# Patient Record
Sex: Female | Born: 1935 | Race: White
Health system: Southern US, Community
[De-identification: ages and names within clinical notes are randomized; demographics above are authoritative.]

## PROBLEM LIST (undated history)

## (undated) DIAGNOSIS — H9190 Unspecified hearing loss, unspecified ear: Secondary | ICD-10-CM

## (undated) DIAGNOSIS — E785 Hyperlipidemia, unspecified: Secondary | ICD-10-CM

## (undated) DIAGNOSIS — N329 Bladder disorder, unspecified: Secondary | ICD-10-CM

## (undated) DIAGNOSIS — IMO0001 Reserved for inherently not codable concepts without codable children: Secondary | ICD-10-CM

## (undated) DIAGNOSIS — E079 Disorder of thyroid, unspecified: Secondary | ICD-10-CM

## (undated) DIAGNOSIS — I499 Cardiac arrhythmia, unspecified: Secondary | ICD-10-CM

## (undated) DIAGNOSIS — R519 Headache, unspecified: Secondary | ICD-10-CM

## (undated) DIAGNOSIS — R2689 Other abnormalities of gait and mobility: Secondary | ICD-10-CM

## (undated) DIAGNOSIS — I10 Essential (primary) hypertension: Secondary | ICD-10-CM

## (undated) DIAGNOSIS — E7800 Pure hypercholesterolemia, unspecified: Secondary | ICD-10-CM

## (undated) DIAGNOSIS — R55 Syncope and collapse: Secondary | ICD-10-CM

## (undated) DIAGNOSIS — S06309A Unspecified focal traumatic brain injury with loss of consciousness of unspecified duration, initial encounter: Secondary | ICD-10-CM

## (undated) DIAGNOSIS — T8859XA Other complications of anesthesia, initial encounter: Secondary | ICD-10-CM

## (undated) DIAGNOSIS — R112 Nausea with vomiting, unspecified: Secondary | ICD-10-CM

## (undated) DIAGNOSIS — R569 Unspecified convulsions: Secondary | ICD-10-CM

## (undated) DIAGNOSIS — E039 Hypothyroidism, unspecified: Secondary | ICD-10-CM

## (undated) HISTORY — DX: Other abnormalities of gait and mobility: R26.89

## (undated) HISTORY — DX: Other complications of anesthesia, initial encounter: T88.59XA

## (undated) HISTORY — DX: Syncope and collapse: R55

## (undated) HISTORY — PX: REPLACEMENT TOTAL KNEE: SUR1224

## (undated) HISTORY — DX: Unspecified convulsions: R56.9

## (undated) HISTORY — PX: BACK SURGERY: SHX140

## (undated) HISTORY — PX: HYSTERECTOMY: SHX81

## (undated) HISTORY — DX: Unspecified hearing loss, unspecified ear: H91.90

## (undated) HISTORY — PX: CARDIAC VALVE REPLACEMENT: SHX585

## (undated) HISTORY — DX: Headache, unspecified: R51.9

## (undated) HISTORY — DX: Reserved for inherently not codable concepts without codable children: IMO0001

## (undated) HISTORY — PX: JOINT REPLACEMENT: SHX530

## (undated) HISTORY — DX: Bladder disorder, unspecified: N32.9

## (undated) HISTORY — DX: Nausea with vomiting, unspecified: R11.2

## (undated) HISTORY — DX: Pure hypercholesterolemia, unspecified: E78.00

## (undated) HISTORY — PX: AORTIC VALVE REPLACEMENT: SHX41

## (undated) HISTORY — DX: Disorder of thyroid, unspecified: E07.9

---

## 2014-07-20 HISTORY — PX: TRANSCATHETER AORTIC VALVE REPLACEMENT: SHX00005

## 2015-07-21 HISTORY — PX: WRIST SURGERY: SHX841

## 2021-03-03 ENCOUNTER — Emergency Department: Payer: Self-pay

## 2021-03-03 ENCOUNTER — Emergency Department: Payer: Medicare Other

## 2021-03-03 ENCOUNTER — Emergency Department
Admission: EM | Admit: 2021-03-03 | Discharge: 2021-03-03 | Payer: Medicare Other | Attending: Emergency Medicine | Admitting: Emergency Medicine

## 2021-03-03 ENCOUNTER — Inpatient Hospital Stay
Admission: EM | Admit: 2021-03-03 | Discharge: 2021-03-06 | DRG: 083 | Disposition: A | Discharge status: Home / Self Care | Payer: Medicare Other | Attending: Surgical Critical Care | Admitting: Surgical Critical Care

## 2021-03-03 DIAGNOSIS — W228XXA Striking against or struck by other objects, initial encounter: Secondary | ICD-10-CM | POA: Insufficient documentation

## 2021-03-03 DIAGNOSIS — I4891 Unspecified atrial fibrillation: Secondary | ICD-10-CM | POA: Diagnosis present

## 2021-03-03 DIAGNOSIS — Z79890 Hormone replacement therapy (postmenopausal): Secondary | ICD-10-CM

## 2021-03-03 DIAGNOSIS — Z79899 Other long term (current) drug therapy: Secondary | ICD-10-CM

## 2021-03-03 DIAGNOSIS — I6201 Nontraumatic acute subdural hemorrhage: Secondary | ICD-10-CM | POA: Diagnosis present

## 2021-03-03 DIAGNOSIS — Z20822 Contact with and (suspected) exposure to covid-19: Secondary | ICD-10-CM | POA: Insufficient documentation

## 2021-03-03 DIAGNOSIS — S061X9A Traumatic cerebral edema with loss of consciousness of unspecified duration, initial encounter: Secondary | ICD-10-CM | POA: Diagnosis present

## 2021-03-03 DIAGNOSIS — R54 Age-related physical debility: Secondary | ICD-10-CM | POA: Diagnosis present

## 2021-03-03 DIAGNOSIS — R1310 Dysphagia, unspecified: Secondary | ICD-10-CM | POA: Diagnosis present

## 2021-03-03 DIAGNOSIS — E039 Hypothyroidism, unspecified: Secondary | ICD-10-CM | POA: Diagnosis present

## 2021-03-03 DIAGNOSIS — I10 Essential (primary) hypertension: Secondary | ICD-10-CM | POA: Diagnosis present

## 2021-03-03 DIAGNOSIS — Z66 Do not resuscitate: Secondary | ICD-10-CM | POA: Diagnosis present

## 2021-03-03 DIAGNOSIS — E871 Hypo-osmolality and hyponatremia: Secondary | ICD-10-CM | POA: Diagnosis present

## 2021-03-03 DIAGNOSIS — W19XXXA Unspecified fall, initial encounter: Secondary | ICD-10-CM

## 2021-03-03 DIAGNOSIS — S065X9A Traumatic subdural hemorrhage with loss of consciousness of unspecified duration, initial encounter: Principal | ICD-10-CM | POA: Diagnosis present

## 2021-03-03 DIAGNOSIS — K5900 Constipation, unspecified: Secondary | ICD-10-CM | POA: Diagnosis present

## 2021-03-03 DIAGNOSIS — W1830XA Fall on same level, unspecified, initial encounter: Secondary | ICD-10-CM | POA: Insufficient documentation

## 2021-03-03 DIAGNOSIS — Z90710 Acquired absence of both cervix and uterus: Secondary | ICD-10-CM

## 2021-03-03 DIAGNOSIS — R296 Repeated falls: Secondary | ICD-10-CM | POA: Diagnosis present

## 2021-03-03 DIAGNOSIS — Y9389 Activity, other specified: Secondary | ICD-10-CM | POA: Insufficient documentation

## 2021-03-03 DIAGNOSIS — Y9301 Activity, walking, marching and hiking: Secondary | ICD-10-CM | POA: Diagnosis present

## 2021-03-03 DIAGNOSIS — S065X1A Traumatic subdural hemorrhage with loss of consciousness of 30 minutes or less, initial encounter: Secondary | ICD-10-CM | POA: Insufficient documentation

## 2021-03-03 DIAGNOSIS — R079 Chest pain, unspecified: Secondary | ICD-10-CM

## 2021-03-03 DIAGNOSIS — G4700 Insomnia, unspecified: Secondary | ICD-10-CM | POA: Diagnosis present

## 2021-03-03 DIAGNOSIS — Y92002 Bathroom of unspecified non-institutional (private) residence single-family (private) house as the place of occurrence of the external cause: Secondary | ICD-10-CM | POA: Insufficient documentation

## 2021-03-03 DIAGNOSIS — E785 Hyperlipidemia, unspecified: Secondary | ICD-10-CM | POA: Diagnosis present

## 2021-03-03 DIAGNOSIS — Y92009 Unspecified place in unspecified non-institutional (private) residence as the place of occurrence of the external cause: Secondary | ICD-10-CM

## 2021-03-03 HISTORY — DX: Essential (primary) hypertension: I10

## 2021-03-03 HISTORY — DX: Hyperlipidemia, unspecified: E78.5

## 2021-03-03 HISTORY — DX: Unspecified focal traumatic brain injury with loss of consciousness of unspecified duration, initial encounter: S06.309A

## 2021-03-03 HISTORY — DX: Cardiac arrhythmia, unspecified: I49.9

## 2021-03-03 LAB — CBC AND DIFFERENTIAL
Absolute NRBC: 0 10*3/uL (ref 0.00–0.00)
Basophils Absolute Automated: 0.03 10*3/uL (ref 0.00–0.08)
Basophils Automated: 0.3 %
Eosinophils Absolute Automated: 0.1 10*3/uL (ref 0.00–0.44)
Eosinophils Automated: 1.1 %
Hematocrit: 37.9 % (ref 34.7–43.7)
Hgb: 12.9 g/dL (ref 11.4–14.8)
Immature Granulocytes Absolute: 0.03 10*3/uL (ref 0.00–0.07)
Immature Granulocytes: 0.3 %
Lymphocytes Absolute Automated: 1.78 10*3/uL (ref 0.42–3.22)
Lymphocytes Automated: 19.9 %
MCH: 31.8 pg (ref 25.1–33.5)
MCHC: 34 g/dL (ref 31.5–35.8)
MCV: 93.3 fL (ref 78.0–96.0)
MPV: 9.2 fL (ref 8.9–12.5)
Monocytes Absolute Automated: 0.57 10*3/uL (ref 0.21–0.85)
Monocytes: 6.4 %
Neutrophils Absolute: 6.42 10*3/uL — ABNORMAL HIGH (ref 1.10–6.33)
Neutrophils: 72 %
Nucleated RBC: 0 /100 WBC (ref 0.0–0.0)
Platelets: 172 10*3/uL (ref 142–346)
RBC: 4.06 10*6/uL (ref 3.90–5.10)
RDW: 13 % (ref 11–15)
WBC: 8.93 10*3/uL (ref 3.10–9.50)

## 2021-03-03 LAB — COMPREHENSIVE METABOLIC PANEL
ALT: 20 U/L (ref 0–55)
AST (SGOT): 31 U/L (ref 5–34)
Albumin/Globulin Ratio: 1.5 (ref 0.9–2.2)
Albumin: 4.3 g/dL (ref 3.5–5.0)
Alkaline Phosphatase: 40 U/L (ref 37–117)
Anion Gap: 12 (ref 5.0–15.0)
BUN: 16 mg/dL (ref 7.0–19.0)
Bilirubin, Total: 0.5 mg/dL (ref 0.2–1.2)
CO2: 21 mEq/L — ABNORMAL LOW (ref 22–29)
Calcium: 9.2 mg/dL (ref 7.9–10.2)
Chloride: 97 mEq/L — ABNORMAL LOW (ref 100–111)
Creatinine: 0.7 mg/dL (ref 0.6–1.0)
Globulin: 2.9 g/dL (ref 2.0–3.6)
Glucose: 126 mg/dL — ABNORMAL HIGH (ref 70–100)
Potassium: 3.9 mEq/L (ref 3.5–5.1)
Protein, Total: 7.2 g/dL (ref 6.0–8.3)
Sodium: 130 mEq/L — ABNORMAL LOW (ref 136–145)

## 2021-03-03 LAB — TROPONIN I: Troponin I: <0.01 ng/mL (ref 0.00–0.05)

## 2021-03-03 LAB — PT AND APTT
PT INR: 1 (ref 0.9–1.1)
PT: 12 s (ref 10.1–12.9)
PTT: 23 s — ABNORMAL LOW (ref 27–39)

## 2021-03-03 LAB — COVID-19 (SARS-COV-2): SARS CoV-2: NEGATIVE

## 2021-03-03 LAB — GFR: EGFR: >60

## 2021-03-03 MED ORDER — ACETAMINOPHEN 10 MG/ML IV SOLN
1000.0000 mg | Freq: Three times a day (TID) | INTRAVENOUS | Status: DC
Start: 2021-03-03 — End: 2021-03-04
  Administered 2021-03-04 (×2): 1000 mg via INTRAVENOUS
  Filled 2021-03-03 (×3): qty 100

## 2021-03-03 MED ORDER — LABETALOL HCL 5 MG/ML IV SOLN (WRAP)
20.0000 mg | Freq: Once | INTRAVENOUS | Status: AC
Start: 2021-03-03 — End: 2021-03-03
  Administered 2021-03-03: 22:00:00 10 mg via INTRAVENOUS
  Filled 2021-03-03: qty 4

## 2021-03-03 NOTE — ED Provider Notes (Cosign Needed)
EMERGENCY DEPARTMENT NOTE     [name] initially seen and examined at   ED PHYSICIAN ASSIGNED       None           ED MIDLEVEL (APP) ASSIGNED       Date/Time Event User Comments    03/03/21 1837 PA/NP Provider Assigned Murrell Converse, Mckinnley Cottier Sheldon Silvan, DNP FNP assigned as Nurse Practitioner            HISTORY OF PRESENT ILLNESS   Historian: [name], EMS  Translator Used: no    Chief Complaint: Syncope and Head Injury       Mechanism of Injury:       87 y.o. female with no known medical history was brought in by EMS for a right-sided head injury s/p possible syncopal episode today.  2 episodes of emesis in route and [name] given Zofran.  [name] has no recollection of incident.  Denies headache, lightheadedness, dizziness, numbness tingling, blurry or double vision, neck pain, back pain, chest pain, shortness of breath, unilateral weakness, abdominal pain, urinary retention, bla no anticoagulant therapy.  Dder or bowel incontinence, saddle anesthesia.    Location of symptoms: Head  Onset of symptoms: Today  What was [name] doing when symptoms started (Context): see above  Severity: moderate  Timing: Acute  Activities that worsen symptoms: None  Activities that improve symptoms: None  Quality: No complaints at this time  Radiation of symptoms: no  Associated signs and Symptoms: see above  Are symptoms worsening? yes  MEDICAL HISTORY     Past Medical History:  History reviewed. No pertinent past medical history.    Past Surgical History:  History reviewed. No pertinent surgical history.    Social History:  Social History     Socioeconomic History    Marital status: Unknown       Family History:  History reviewed. No pertinent family history.    Outpatient Medication:  Previous Medications    AMLODIPINE (NORVASC) 5 MG TABLET    TAKE 1 TABLET BY MOUTH IN THE MORNING HALF TABLET THE EVENING    DICLOFENAC SODIUM (VOLTAREN) 1 % GEL TOPICAL GEL    APPLY 1 GRAM EVERY 8 HOURS    DIGOXIN (LANOXIN) 0.125 MG TABLET    TAKE 1 TABLET BY  MOUTH ONCE A DAY ON TUESDAY, THURSDAY, SATURDAY, AND SUNDAY.    DIGOXIN (LANOXIN) 0.25 MG TABLET    TAKE 1 TABLET BY MOUTH ON MONDAY/WEDNESDAY AND FRIDAY 90 DAY(S)    FUNGOID TINCTURE 2 % SOLUTION    APPLY 1ML TWICE A DAY TO TOE NAIL    LEVOTHYROXINE (SYNTHROID) 88 MCG TABLET    Take 88 mcg by mouth every morning    LISINOPRIL (ZESTRIL) 10 MG TABLET    TAKE 1 TABLET ORALLY ONCE A DAY EVERY MORNING FOR 30 DAY(S)    MELOXICAM (MOBIC) 7.5 MG TABLET    Take 7.5 mg by mouth daily    RALOXIFENE (EVISTA) 60 MG TABLET    Take 60 mg by mouth daily    ROSUVASTATIN (CRESTOR) 20 MG TABLET    Take 20 mg by mouth nightly    UREA (CARMOL) 40 % CREAM    APPLY 1 GRAM ONCE A DAY TO NAILS AND CALLUSES         REVIEW OF SYSTEMS   Review of Systems     10 /14 Review of Systems completed and negative except as stated above in the HPI   Reviewed: Constitutional, HENT, Eyes, Resp, CV, GI, GU,  MSK, Skin, Neuro, Psych    PHYSICAL EXAM     ED Triage Vitals [03/03/21 1836]   Enc Vitals Group      BP 188/86      Heart Rate 92      Resp Rate 16      Temp 98.2 F (36.8 C)      Temp Source Oral      SpO2 99 %      Weight 59 kg      Height 1.6 m      Head Circumference       Peak Flow       Pain Score 0      Pain Loc       Pain Edu?       Excl. in GC?      Vitals:    03/03/21 1836 03/03/21 1847 03/03/21 1953   BP: 188/86 180/81 172/79   Pulse: 92 89 90   Resp: 16 16 16    Temp: 98.2 F (36.8 C)     TempSrc: Oral     SpO2: 99% 98% 97%   Weight: 59 kg     Height: 5\' 3"  (1.6 m)       Nursing note and vitals reviewed.  Constitutional:  Well developed, well nourished. Awake & Oriented x3.  Head:  Atraumatic. Normocephalic.    Eyes:  PERRL. EOMI. Conjunctivae are not pale.  ENT:  Mucous membranes are moist and intact. Oropharynx is clear and symmetric.  Patent airway.  Neck:  Supple. Full ROM.    Cardiovascular:  Regular rate. Regular rhythm. No murmurs, rubs, or gallops.  Pulmonary/Chest:  No evidence of respiratory distress. Clear to auscultation  bilaterally.  No wheezing, rales or rhonchi.   Abdominal:  Soft and non-distended. There is no tenderness. No rebound, guarding, or rigidity.  Back:  Full ROM. Nontender.  Extremities:  No edema. No cyanosis. No clubbing. Full range of motion in all extremities.  Skin:  Skin is warm and dry.  No diaphoresis. No rash.   Neurological:  Alert, awake, and appropriate. Normal speech. Motor normal.  Psychiatric:  Good eye contact. Normal interaction, affect, and behavior.      MEDICAL DECISION MAKING     DISCUSSION      87 year old female with a history of hypertension, hyperlipidemia, hypothyroidism was evaluated for a head injury with LOC s/p fall.    [name] awake and alert and oriented x4 answering questions appropriately., + Large hematoma of right side scalp.  No midline tenderness of cervical spine.  Normal neurological exam without focal deficits.  Elevated BP of 188/86, otherwise stable vitals.  Low suspicion for CVA at this time. Differential diagnosis includes but not limited to ICH, orthostatic hypotension, vasovagal syncope, hypoglycemia, seizure. Plan for STAT CT head.     Spoke with daughter who stated [name] currently visiting from [place] and staying with her during the summer.  She has been her normal self and running errands with her all day.  While at her daughter's house, while [name] was walking to the bathroom, the daughter heard a thud on the ground and found the [name] lying on her back making gurgling sounds and noticed fullness of her mouth. + LOC approximately 6 minutes until EMS arrival.  [name] was assisted on the toilet to urinate and had multiple episodes of emesis prior to arrival.    1942: Spoke with Radiologist, Dr. Quentin Mulling regarding CT head findings of a small acute left subdural hematoma overlying left frontoparietal  lobe.  Hyperdense masslike nodule left parietotemporal lobe.  1948: Transfer line. [facility] bed capacity.  Recommended ED to ED transfer.  1955: [facility] ED, Dr. Willeen Niece  accepting.   2018: MMT arrival for transfer. [name] clinically stable to transfer at this time.     Results discussed with [name] and family members and all questions answered.           I do not suspect severe sepsis or septic shock    Vital Signs: Reviewed the [name]'s vital signs.   Nursing Notes: Reviewed and utilized available nursing notes.  Medical Records Reviewed: Reviewed available past medical records.  Counseling: The emergency provider has spoken with the [name] and discussed today's findings, in addition to providing specific details for the plan of care.  Questions are answered and there is agreement with the plan.      MIPS DOCUMENTATION      HEAD TRAUMA  INDICATIONS FOR CT HEAD DUE TO TRAUMA    Head CT/trauma indications - Age 36 or older      CARDIAC STUDIES    The following cardiac studies were independently interpreted by the Emergency Medicine Physician.  For full cardiac study results please see chart.    EKG Interpretation:  Signed and interpreted by me Murrell Converse)  Time Interpreted: 1901  Comparison: none  Rate: 87  Rhythm: NSR  Axis: normal  Intervals: normal  Blocks: IRBBB  ST segments: no STEMI  Interpretation: Nonspecific  EKG    EMERGENCY IMAGING STUDIES    The following imagine studies were independently interpreted by me (emergency physician):    Radiology:  Interpreted by me Murrell Converse)  Study: Chest Xray   Results: No infiltrate. No pneumothorax. No hemothorax. No cardiomegaly. No CHF.  Impression: No acute intrathoracic abnormality.    RADIOLOGY IMAGING STUDIES      CT Cervical Spine without Contrast   Final Result      1. No acute bony abnormality of the cervical spine.   2. Straightening cervical spine may relate to position or spasm.   3. Degenerative changes of the lower cervical spine.      Wyatt Portela, MD    03/03/2021 7:39 PM      CT Head without Contrast   Final Result      1. Small acute left subdural hematoma overlying the left frontal   parietal lobe.   2. Ovoid hyperdense  masslike nodule in the left parietotemporal lobe.   Differential considerations include mass versus focal evolving   intrapedicle hemorrhage. Recommend further evaluation with   contrast-enhanced MRI.      Findings were discussed with Delorise Shiner unit on 03/03/2021 at 7:40 PM      Wyatt Portela, MD    03/03/2021 7:59 PM      Chest AP Portable    (Results Pending)         PULSE OXIMETRY    Oxygen Saturation by Pulse Oximetry: 97%  Interventions: none  Interpretation:  stable    EMERGENCY DEPT. MEDICATIONS      ED Medication Orders (From admission, onward)      None            LABORATORY RESULTS    Ordered and independently interpreted AVAILABLE laboratory tests. Please see results section in chart for full details.  Results for orders placed or performed during the hospital encounter of 03/03/21   COVID-19 (SARS-COV-2) ( Rapid)    Specimen: Nasopharynx; Nasopharyngeal Swab   Result Value Ref Range    Purpose of  COVID testing Screening     SARS-CoV-2 Specimen Source Nasopharyngeal    CBC and differential   Result Value Ref Range    WBC 8.93 3.10 - 9.50 x10 3/uL    Hgb 12.9 11.4 - 14.8 g/dL    Hematocrit 54.0 98.1 - 43.7 %    Platelets 172 142 - 346 x10 3/uL    RBC 4.06 3.90 - 5.10 x10 6/uL    MCV 93.3 78.0 - 96.0 fL    MCH 31.8 25.1 - 33.5 pg    MCHC 34.0 31.5 - 35.8 g/dL    RDW 13 11 - 15 %    MPV 9.2 8.9 - 12.5 fL    Neutrophils 72.0 None %    Lymphocytes Automated 19.9 None %    Monocytes 6.4 None %    Eosinophils Automated 1.1 None %    Basophils Automated 0.3 None %    Immature Granulocytes 0.3 None %    Nucleated RBC 0.0 0.0 - 0.0 /100 WBC    Neutrophils Absolute 6.42 (H) 1.10 - 6.33 x10 3/uL    Lymphocytes Absolute Automated 1.78 0.42 - 3.22 x10 3/uL    Monocytes Absolute Automated 0.57 0.21 - 0.85 x10 3/uL    Eosinophils Absolute Automated 0.10 0.00 - 0.44 x10 3/uL    Basophils Absolute Automated 0.03 0.00 - 0.08 x10 3/uL    Immature Granulocytes Absolute 0.03 0.00 - 0.07 x10 3/uL    Absolute NRBC 0.00 0.00 - 0.00  x10 3/uL   Comprehensive metabolic panel   Result Value Ref Range    Glucose 126 (H) 70 - 100 mg/dL    BUN 19.1 7.0 - 47.8 mg/dL    Creatinine 0.7 0.6 - 1.0 mg/dL    Sodium 295 (L) 621 - 145 mEq/L    Potassium 3.9 3.5 - 5.1 mEq/L    Chloride 97 (L) 100 - 111 mEq/L    CO2 21 (L) 22 - 29 mEq/L    Calcium 9.2 7.9 - 10.2 mg/dL    Protein, Total 7.2 6.0 - 8.3 g/dL    Albumin 4.3 3.5 - 5.0 g/dL    AST (SGOT) 31 5 - 34 U/L    ALT 20 0 - 55 U/L    Alkaline Phosphatase 40 37 - 117 U/L    Bilirubin, Total 0.5 0.2 - 1.2 mg/dL    Globulin 2.9 2.0 - 3.6 g/dL    Albumin/Globulin Ratio 1.5 0.9 - 2.2    Anion Gap 12.0 5.0 - 15.0   Troponin I   Result Value Ref Range    Troponin I <0.01 0.00 - 0.05 ng/mL   GFR   Result Value Ref Range    EGFR >60.0     PT/APTT   Result Value Ref Range    PT 12.0 10.1 - 12.9 sec    PT INR 1.0 0.9 - 1.1    PTT 23 (L) 27 - 39 sec   ECG 12 lead   Result Value Ref Range    Ventricular Rate 87 BPM    Atrial Rate 87 BPM    P-R Interval 152 ms    QRS Duration 108 ms    Q-T Interval 408 ms    QTC Calculation (Bezet) 490 ms    P Axis 28 degrees    R Axis 4 degrees    T Axis 24 degrees    IHS MUSE NARRATIVE AND IMPRESSION       NORMAL SINUS RHYTHM  INCOMPLETE RIGHT BUNDLE BRANCH BLOCK  INFERIOR MYOCARDIAL INFARCTION , AGE UNDETERMINED  ABNORMAL ECG  NO PREVIOUS ECGS AVAILABLE         CRITICAL CARE/PROCEDURES    Critical Care  Performed by: Sheldon Silvan, DNP FNP  Authorized by: Sheldon Silvan, DNP FNP     Critical care provider statement:     Critical care time (minutes):  47    Critical care time was exclusive of:  Separately billable procedures and treating other patients and teaching time    Critical care was necessary to treat or prevent imminent or life-threatening deterioration of the following conditions:  Trauma    Critical care was time spent personally by me on the following activities:  Blood draw for specimens, development of treatment plan with [name] or surrogate, examination of [name], ordering  and performing treatments and interventions, ordering and review of laboratory studies, ordering and review of radiographic studies, pulse oximetry, re-evaluation of [name]'s condition, review of old charts and evaluation of [name]'s response to treatment    I assumed direction of critical care for this [name] from another provider in my specialty: no      Care discussed with: accepting provider at another facility      DIAGNOSIS      Diagnosis:  Final diagnoses:   Post-traumatic subdural hematoma, with loss of consciousness of 30 minutes or less, initial encounter   Fall, initial encounter       Disposition:  ED Disposition       ED Disposition   Transfer to [facility] ED    Condition   --    Date/Time   Mon Mar 03, 2021  8:02 PM    Comment   Dr. Eloise Harman accepting                 Prescriptions:  [name]'s Medications   New Prescriptions    No medications on file   Previous Medications    AMLODIPINE (NORVASC) 5 MG TABLET    TAKE 1 TABLET BY MOUTH IN THE MORNING HALF TABLET THE EVENING    DICLOFENAC SODIUM (VOLTAREN) 1 % GEL TOPICAL GEL    APPLY 1 GRAM EVERY 8 HOURS    DIGOXIN (LANOXIN) 0.125 MG TABLET    TAKE 1 TABLET BY MOUTH ONCE A DAY ON TUESDAY, THURSDAY, SATURDAY, AND SUNDAY.    DIGOXIN (LANOXIN) 0.25 MG TABLET    TAKE 1 TABLET BY MOUTH ON MONDAY/WEDNESDAY AND FRIDAY 90 DAY(S)    FUNGOID TINCTURE 2 % SOLUTION    APPLY TWICE A DAY TO TOE NAIL    LEVOTHYROXINE (SYNTHROID) 88 MCG TABLET    Take 88 mcg by mouth every morning    LISINOPRIL (ZESTRIL) 10 MG TABLET    TAKE 1 TABLET ORALLY ONCE A DAY EVERY MORNING FOR 30 DAY(S)    MELOXICAM (MOBIC) 7.5 MG TABLET    Take 7.5 mg by mouth daily    RALOXIFENE (EVISTA) 60 MG TABLET    Take 60 mg by mouth daily    ROSUVASTATIN (CRESTOR) 20 MG TABLET    Take 20 mg by mouth nightly    UREA (CARMOL) 40 % CREAM    APPLY 1 GRAM ONCE A DAY TO NAILS AND CALLUSES   Modified Medications    No medications on file   Discontinued Medications    No medications on file       This note was  generated by the Epic EMR system/ Dragon speech recognition and may contain inherent errors or omissions not intended by  the user. Grammatical errors, random word insertions, deletions and pronoun errors  are occasional consequences of this technology due to software limitations. Not all errors are caught or corrected. If there are questions or concerns about the content of this note or information contained within the body of this dictation they should be addressed directly with the author for clarification.     Sheldon Silvan, DNP FNP  03/03/21 2026

## 2021-03-03 NOTE — Consults (Signed)
NEUROSURGERY CONSULTATION    Date Time: 03/03/21 9:40 PM  Patient Name: [name]  Requesting Physician: Aggie Moats, MD  Consulting Physician: {NS Attendings:[id number]}   Covered By: {NS Resident/PA:[id number]}    Time Consult Called:  ***     Time of Exam:  ***    Assessment:   87 y.o. female with ***    Plan:   ***  ICP monitor: {ICP monitor:[id number]}     Reason for Consultation:   ***    History:   [name] is a 87 y.o. *** handed female who presents to the hospital on 03/03/2021 with ***     Chief Complaint   Patient presents with    Fall    Headache     Location: ***  Quality: ***  Severity: ***  Duration: ***  Timing: ***  Context: ***  Modifying factors:  ***  Associated signs/symptoms: ***    Past Medical History:   No past medical history on file.  ***I reviewed the past medical history myself. The patient has no medical problems.  ***Unavailable due to patient's condition.     Past Surgical History:   No past surgical history on file.  ***I reviewed the past surgical history myself. The patient has no prior surgical history.  ***Unavailable due to patient's condition.     Family History:   No family history on file.  ***I reviewed the past family history myself. Family history is non-contributory and there is no family history pertinent to this encounter.  ***Unavailable due to patient's condition.     Social History:     Social History     Socioeconomic History    Marital status: Unknown     Spouse name: Not on file    Number of children: Not on file    Years of education: Not on file    Highest education level: Not on file   Occupational History    Not on file   Tobacco Use    Smoking status: Not on file    Smokeless tobacco: Not on file   Substance and Sexual Activity    Alcohol use: Not on file    Drug use: Not on file    Sexual activity: Not on file   Other Topics Concern    Not on file   Social History Narrative    Not on file     Social Determinants of Health     Financial Resource  Strain: Not on file   Food Insecurity: Not on file   Transportation Needs: Not on file   Physical Activity: Not on file   Stress: Not on file   Social Connections: Not on file   Intimate Partner Violence: Not on file   Housing Stability: Not on file     ***Reviewed and non-contributory to patient's current illness.   ***Unavailable due to patient's condition.     Allergies:   No Known Allergies  ***Unavailable due to patient's condition    Medications:     No current facility-administered medications on file prior to encounter.     Current Outpatient Medications on File Prior to Encounter   Medication Sig Dispense Refill    amLODIPine (NORVASC) 5 MG tablet TAKE 1 TABLET BY MOUTH IN THE MORNING HALF TABLET THE EVENING      diclofenac Sodium (VOLTAREN) 1 % Gel topical gel APPLY 1 GRAM EVERY 8 HOURS      digoxin (LANOXIN) 0.125 MG tablet TAKE 1 TABLET BY MOUTH  ONCE A DAY ON TUESDAY, THURSDAY, SATURDAY, AND SUNDAY.      digoxin (LANOXIN) 0.25 MG tablet TAKE 1 TABLET BY MOUTH ON MONDAY/WEDNESDAY AND FRIDAY 90 DAY(S)      Fungoid Tincture 2 % Solution APPLY TWICE A DAY TO TOE NAIL      levothyroxine (SYNTHROID) 88 MCG tablet Take 88 mcg by mouth every morning      lisinopril (ZESTRIL) 10 MG tablet TAKE 1 TABLET ORALLY ONCE A DAY EVERY MORNING FOR 30 DAY(S)      meloxicam (MOBIC) 7.5 MG tablet Take 7.5 mg by mouth daily      raloxifene (EVISTA) 60 MG tablet Take 60 mg by mouth daily      rosuvastatin (CRESTOR) 20 MG tablet Take 20 mg by mouth nightly      urea (CARMOL) 40 % cream APPLY 1 GRAM ONCE A DAY TO NAILS AND CALLUSES         Review of Systems:   A comprehensive review of systems was: completed and is negative except as noted in HPI ***    Constitutional: negative for - fever, chills, sweating, fatigue, change in activity  General: negative for - recent weight gain, recent weight loss, appetite change, headaches  Eyes: negative for - blurry vision, double vision, eye pain, light sensitivity, eye discharge  ENT:  negative for - hearing loss, nasal discharge, hoarseness, sore throat, swallowing difficulty  Heart: negative for - chest pain or tightness, palpitations, fainting, other heart trouble  Lungs: negative for - chronic cough, shortness of breath, wheezing, sleep apnea, hemoptysis  Gastrointestinal: negative for - abdominal pain, nausea, vomiting, diarrhea, constipation, bloody stool  Genito-Urinary: negative for - urinary retention, urinary incontinence, urinary urgency, urinary discharge  Musculoskeletal: negative for - joint swelling, joint pain, leg swelling, leg cramping, fractures  Neurological: negative for - dizziness, tremors, memory loss, speech difficulty, seizures  Hematological and Lymphatic: negative for - easy bleeding, easy bruising, swollen nodes  Skin: negative for - rash, enlarged glands, moles or skin lesions, color change, infection  Psychiatric: negative for - anxiety, depression, confusion, excessive stress, suicidal thoughts    All other systems were reviewed and are negative.    Physical Exam:     Vitals:    03/03/21 2110   BP: 175/74   Pulse: 94   Resp: 18   Temp: 97.8 F (36.6 C)   SpO2: 97%       Physical Exam:  General: well appearing adult ***, no obvious acute distress, cooperative with examination  Psychologic: Affect appropriate, judgment and insight consistent with situation, no delusions or hallucinations  Skin: Warm, dry, no obvious lesions or scars  Eyes: Sclerae anicteric, no conjunctival injection  ENT: No otorrhea, no rhinorrhea, trachea midline  Head: Normocephalic, atraumatic  Neck: No palpable masses  Musculoskeletal: Full ROM, normal muscle tone, no atrophy  Pulmonary: Normal respiratory effort, no audible wheezing  Cardiovascular: No pedal edema, pulses 2+ in bilat lower extremities  Abdominal: Non-tender to palpation, non-distended, no organomegaly, no palpable masses    Neuro Exam:   Awake alert oriented x ***  Fund of knowledge appropriate.  Recent and remote memory are  intact.  Attention span and concentration appear normal.  No delusions or hallucinations.  GCS: *** E: *** V: *** M: ***  Speech is *** Language function is normal. There is no evidence of aphasia in conversational speech.   PERRL *** EOM: ***, face: ***, Tongue: ***   Cranial nerves:   -CN II: Visual  fields full to bedside confrontation   -CN III, IV, VI: Pupils equal, round, and reactive to light; extraocular movements intact; no ptosis                                 -CN V: Facial sensation intact in V1 through V3 distributions   -CN VII: Face symmetric   -CN VIII: Hearing intact to conversational speech   -CN IX, X: Palate elevates symmetrically; normal phonation   -CN XI: Symmetric full strength of sternocleidomastoid and trapezius muscles   -CN XII: Tongue protrudes midline  Strength/Motor function:   LUE  Delt: ***/5 Bi: ***/5 Tri: ***/5 Wfl: ***/5 Wex: ***/5 Grip: ***/5  RUE  Delt: ***/5 Bi: ***/5 Tri: ***/5 Wfl: ***/5 Wex: ***/5 Grip: ***/5  LLE  Psoas: ***/5 Quad: ***/5 Lfl: ***/5 Lex: ***/5 ADF: ***/5 APF: ***/5 EHL: ***/5  RLE  Psoas: ***/5 Quad: ***/5 Lfl: ***/5 Lex: ***/5 ADF: ***/5 APF: ***/5 EHL: ***/5  Sensation to light touch: ***  Sensation to sharp touch: ***  Pronator drift: ***  DTRs:   Left: Bic: *** Tric: *** Pat: *** Ankle: ***  Right: Bic: *** Tric: *** Pat: *** Ankle: ***  Hoffmann's: ***  Clonus: ***  Babinski: ***  Coordination: Finger to Nose *** No tremors.  Gait: Station normal, gait stable; tandem walk intact; able to toe and heel walk.  Romberg negative.    Labs Reviewed:     Lab Results   Component Value Date    WBC 8.93 03/03/2021    HGB 12.9 03/03/2021    HCT 37.9 03/03/2021    MCV 93.3 03/03/2021    PLT 172 03/03/2021     Lab Results   Component Value Date    NA 130 (L) 03/03/2021    K 3.9 03/03/2021    CL 97 (L) 03/03/2021    CO2 21 (L) 03/03/2021     Lab Results   Component Value Date    INR 1.0 03/03/2021    PT 12.0 03/03/2021       Rads:     Radiology Results (24 Hour)        ** No results found for the last 24 hours. **              Signed by: Cecilio Asper, [facility]  Date/Time: 03/03/21 9:40 PM

## 2021-03-03 NOTE — ED Notes (Signed)
Bed: T 2A  Expected date:   Expected time:   Means of arrival:   Comments:  Trauma transfer -> 22 when settled

## 2021-03-03 NOTE — ED Notes (Signed)
MMT here to tx pt to [facility]; VSS and pt remains AxOx4 on d/c

## 2021-03-03 NOTE — ED Notes (Signed)
Pt AxOx4 at this time; in NAD and VSS; assisted with using restroom via [place]; family remains at bedside

## 2021-03-03 NOTE — ED Notes (Signed)
Tx from [facility] for SDH s/p fall. CT shows, small left SDH  -> Left parietal.  measures 1.9 x 1.2 cm. ? intraparenchymal hemorrhage measuring 1.9 x 1.2 cm in the L parietotemporal lobe    Allegedly per family. Had a another traumatic headbleed from a fall (in [place]) last year. Hx of Irregular heartbeat but no thinners, HTN, HLD and Hypothyroid.    A&O x4, pupils 2-3 brisk bilat. MAEW, 5/5 strength in all extremities

## 2021-03-03 NOTE — Discharge Instructions (Addendum)
You were seen in the emergency department for head injury with loss of consciousness after a fall today.    There is a subdural brain bleed seen on your CAT scan.    We are transferring you to [facility] emergency department for further evaluation.

## 2021-03-03 NOTE — H&P (Signed)
TRAUMA HISTORY AND PHYSICAL     Date Time: 03/03/21 9:54 PM  Patient Name: [name]  Attending Physician: Era Skeen, MD  Primary Care Physician: Patsy Lager, MD    Date of Admission:   03/03/2021  9:04 PM    Trauma Level:   Consult    Assessment/Plan:   The patient has the following active problems:  There are no active hospital problems to display for this patient.       Plan by systems:  Neuro: L SDH with likely IPH, acute post traumatic pain  - NSGY: Q1 hr NC, HOB >30  - multimodal pain control regimen - try to refrain from narcotic use as patient has severe GI SE  Pulm: SORA   - pulm toilet/IS/mobilization   CV: HDS, hx HTN, afib on digoxin currently in sinus rhythm   - monitor VS   - SBP goal <160   - cont home meds   Endo: hypothyroidism  - cont home synthroid   GI: NPO, mIVF  - antiemetics prn   Heme/ID: afebrile, no abx indicated   Renal: monitor I/O   Neuromuscular: activity as tolerated  - fall precautions   - PT/OT   Psych: supportive   Wounds: lwc prn     Patient will be admitted to: ICU  Massive transfusion protocol:  No      Consulting Services:   Neurosurgery : PA - notified at 9:55 PM    Patient Complaint:   [name] is a 87 y.o. female who presents to the hospital after Fall: Yes.  Patient is visiting from [facility] and was getting ready to go to her son's house for dinner when she fell and hit her head. She is amnestic to the event.   Currently she endorses headache and sinus pain.   Per daughter the patient has a walker at home but does not utilize it often. Her last fall prior to this was approximately 1.5 years ago and at the time also had a ICH.   She is not on any AP or AC.       Allergies:   No Known Allergies    Medication:     Home Medications       Med List Status: Complete Set By: Sheron Nightingale, RN at 03/04/2021  8:30 PM              amLODIPine (NORVASC) 5 MG tablet     TAKE 1 TABLET BY MOUTH IN THE MORNING HALF TABLET THE EVENING     diclofenac  Sodium (VOLTAREN) 1 % Gel topical gel     APPLY 1 GRAM EVERY 8 HOURS     digoxin (LANOXIN) 0.125 MG tablet     TAKE 1 TABLET BY MOUTH ONCE A DAY ON TUESDAY, THURSDAY, SATURDAY, AND SUNDAY.     digoxin (LANOXIN) 0.25 MG tablet     TAKE 1 TABLET BY MOUTH ON MONDAY/WEDNESDAY AND FRIDAY 90 DAY(S)     Fungoid Tincture 2 % Solution     APPLY TWICE A DAY TO TOE NAIL     levothyroxine (SYNTHROID) 88 MCG tablet     Take 88 mcg by mouth every morning     lisinopril (ZESTRIL) 10 MG tablet     TAKE 1 TABLET ORALLY ONCE A DAY EVERY MORNING FOR 30 DAY(S)     meloxicam (MOBIC) 7.5 MG tablet     Take 7.5 mg by mouth daily     raloxifene (EVISTA) 60 MG  tablet     Take 60 mg by mouth daily     rosuvastatin (CRESTOR) 20 MG tablet     Take 20 mg by mouth nightly     urea (CARMOL) 40 % cream     APPLY 1 GRAM ONCE A DAY TO NAILS AND CALLUSES           Past Medical History:   HTN  Afib  HLD    Past Surgical History:   BL knee replacements   Hysterectomy     Family History:   No pertinent family hx     Social History:     Denies any current EtOH, tobacco, or drug use      Vaccination:   Tetanus up to date: Unknown    Review of Systems:   Review of Systems   Constitutional: Negative.    HENT:  Positive for hearing loss. Negative for ear discharge, ear pain, sore throat and tinnitus.    Eyes: Negative.    Respiratory: Negative.  Negative for stridor.    Cardiovascular: Negative.    Gastrointestinal: Negative.    Genitourinary: Negative.    Musculoskeletal: Negative.    Skin: Negative.    Neurological:  Positive for headaches. Negative for dizziness, tingling, tremors, sensory change, speech change, focal weakness and weakness.   Endo/Heme/Allergies: Negative.    Psychiatric/Behavioral: Negative.       Physical Exam:   Physical Exam  Vitals reviewed.   Constitutional:       Appearance: Normal appearance. She is not ill-appearing or toxic-appearing.      Comments: Mild discomfort due to headache   HENT:      Head: Normocephalic.       Comments: Small contusion on scalp     Right Ear: External ear normal.      Left Ear: External ear normal.      Nose: Nose normal.      Mouth/Throat:      Mouth: Mucous membranes are moist.      Pharynx: Oropharynx is clear.   Eyes:      Extraocular Movements: Extraocular movements intact.      Conjunctiva/sclera: Conjunctivae normal.      Pupils: Pupils are equal, round, and reactive to light.   Cardiovascular:      Rate and Rhythm: Normal rate and regular rhythm.      Heart sounds: Normal heart sounds.   Pulmonary:      Effort: Pulmonary effort is normal.      Breath sounds: Normal breath sounds.   Abdominal:      General: There is no distension.      Palpations: Abdomen is soft.      Tenderness: There is no abdominal tenderness. There is no guarding.   Genitourinary:     Comments: Pelvis stable  Musculoskeletal:         General: No deformity.      Cervical back: Normal range of motion and neck supple. No rigidity or tenderness.      Comments: T/L spine without obvious step off or deformity   Trace edema of BLE   Motor/sensory intact in all extremities   Skin:     General: Skin is warm and dry.   Neurological:      General: No focal deficit present.      Mental Status: She is alert and oriented to person, place, and time.   Psychiatric:         Mood and Affect: Mood normal.  Behavior: Behavior normal.       Vitals:    03/03/21 2110   BP: 175/74   Pulse: 94   Resp: 18   Temp: 97.8 F (36.6 C)   SpO2: 97%       Labs:     Recent Labs   Lab 03/03/21  1903   WBC 8.93   RBC 4.06   Hgb 12.9   Hematocrit 37.9   Platelets 172   Glucose 126*   BUN 16.0   Creatinine 0.7   Calcium 9.2   Sodium 130*   Potassium 3.9   Chloride 97*   CO2 21*       Rads:   Radiological Procedure reviewed.      None      The following images were received from an outside facility and reviewed:CT Head and CT Spine    Attending Attestation:   I saw and examined the patient on the date of service with the team on multidisciplinary rounds. I  reviewed the history, exam, laboratory findings, radiographic images, and plan. I agree with the above note as edited.       This patient has a high probability of sudden clinically significant deterioration which requires the highest level of physician preparedness to intervene urgently. I managed/supervised life or organ supporting interventions that required frequent physician assessments. I devoted my full attention to the direct care of this patient for this period of time. Organ systems that require intensive critical care support include: See Assessment and Plan.      All critical care time personally performed today was exclusive of teaching, billable procedures and not overlapping with any other providers.      Critical care time: 40 min    Era Skeen, MD  Trauma and Acute Care Surgeon

## 2021-03-03 NOTE — ED Provider Notes (Signed)
[facility] EMERGENCY DEPARTMENT H&P         CLINICAL SUMMARY          Diagnosis:    .     Final diagnoses:   Nontraumatic acute subdural hemorrhage   Fall, initial encounter         MDM Notes:    Subdural hemorrhage status post fall.  Admitted to trauma service for further evaluation management.             Disposition:         Admit      ED Disposition       ED Disposition   Admit    Condition   --    Date/Time   Mon Mar 03, 2021 11:03 PM    Comment   Admitting Physician: Dani Gobble [[id number]]   Service:: Trauma [[id number]]   Estimated Length of Stay: > or = to 2 midnights   Tentative Discharge Plan?: Home or Self Care [1]   Does patient need telemetry?: No                           CLINICAL INFORMATION        HPI:      Chief Complaint: Fall and Headache  .    [name] is a 87 y.o. female  has a past medical history of Bleeding in head following injury with loss of consciousness, Disorder of thyroid, Hyperlipidemia, Hypertension, and Irregular heartbeat. who presents with was walking to bathroom 6pm tonight and had loc. Pt unsure what happened. Sister found pt on floor. Went to [facility] where they found SDH on CT and transferred here. C/o headache, no other symptoms. No vomiting, weakness, numbness, neck pain, back pain, chest pain, sob, fever, diarrhea, urinary symptoms.    History obtained from: family          ROS:      Positive and negative ROS elements as per HPI.  All other systems reviewed and negative.    Interpretations:     Radiology -     interpreted by me with the following observations: CT per radiology report reviewed by me            Physical Exam:      Pulse 94  BP 175/74  Resp 18  SpO2 97 %  Temp 97.8 F (36.6 C)    Constitutional: Vital signs reviewed. Well appearing. No apparent distress.  Head: Normocephalic, atraumatic.  Eyes: EOMI, PERRLA, No conjunctival injection. No discharge.  ENT: Mucous membranes moist. Normal appearing oropharynx. No  swelling.  Neck: Normal range of motion. Non-tender. No mass appreciated.  Respiratory/Chest: Clear to auscultation. No respiratory distress.   Cardiovascular: Regular rate and rhythm. No murmur.   Abdomen: Soft, non-tender. No guarding. Non-distended. No masses or hepatosplenomegaly.  Upper Extremities: No edema. No cyanosis.  Lower Extremities: No edema. No cyanosis.  Neurological: No focal motor deficits by observation. Speech normal.   Skin: Warm and dry. No rash.  Psychiatric: Normal affect. Normal concentration.              PAST HISTORY        Primary Care Provider: Patsy Lager, MD        PMH/PSH:    .     Past Medical History:   Diagnosis Date    Bleeding in head following injury with loss of consciousness     a year ago  Disorder of thyroid     Hyperlipidemia     Hypertension     Irregular heartbeat        She has no past surgical history on file.      Social/Family History:      She reports that she has never smoked. She has never used smokeless tobacco. No history on file for alcohol use and drug use.    History reviewed. No pertinent family history.      Listed Medications on Arrival:    .     Current Discharge Medication List        CONTINUE these medications which have NOT CHANGED    Details   amLODIPine (NORVASC) 5 MG tablet TAKE 1 TABLET BY MOUTH IN THE MORNING HALF TABLET THE EVENING      diclofenac Sodium (VOLTAREN) 1 % Gel topical gel APPLY 1 GRAM EVERY 8 HOURS      !! digoxin (LANOXIN) 0.125 MG tablet TAKE 1 TABLET BY MOUTH ONCE A DAY ON TUESDAY, THURSDAY, SATURDAY, AND SUNDAY.      !! digoxin (LANOXIN) 0.25 MG tablet TAKE 1 TABLET BY MOUTH ON MONDAY/WEDNESDAY AND FRIDAY 90 DAY(S)      Fungoid Tincture 2 % Solution APPLY TWICE A DAY TO TOE NAIL      levothyroxine (SYNTHROID) 88 MCG tablet Take 88 mcg by mouth every morning      lisinopril (ZESTRIL) 10 MG tablet TAKE 1 TABLET ORALLY ONCE A DAY EVERY MORNING FOR 30 DAY(S)      meloxicam (MOBIC) 7.5 MG tablet Take 7.5 mg by mouth daily       raloxifene (EVISTA) 60 MG tablet Take 60 mg by mouth daily      rosuvastatin (CRESTOR) 20 MG tablet Take 20 mg by mouth nightly      urea (CARMOL) 40 % cream APPLY 1 GRAM ONCE A DAY TO NAILS AND CALLUSES       !! - Potential duplicate medications found. Please discuss with provider.         Allergies: She has No Known Allergies.            VISIT INFORMATION        Clinical Course in the ED:      Throughout the stay in the Emergency Department, questions and concerns surrounding pain management, care plan, diagnostic studies, effects of medications administered or prescribed, and future care plan were assessed and addressed.           Medications Given in the ED:    .     ED Medication Orders (From admission, onward)      Start Ordered     Status Ordering Provider    03/03/21 2300 03/03/21 2212    Every 8 hours scheduled        Route: Intravenous  Ordered Dose: 1,000 mg     Discontinued PARK, JU AE    03/03/21 2205 03/03/21 2204  labetalol (NORMODYNE,TRANDATE) injection 20 mg  Once        Route: Intravenous  Ordered Dose: 20 mg     Last MAR action: Given PARK, JU AE              Procedures:      Procedures            RESULTS        Lab Results:      Results       Procedure Component Value Units Date/Time    Digoxin  level [[id number]] Collected: 03/05/21 0503    Specimen: Blood Updated: 03/05/21 0559     Digoxin Level 0.8 ng/mL      Digoxin Date of Last Dose 03/04/2021     Digoxin Time of Last Dose ?    Narrative:      Last Dose Known?->Yes    Magnesium [[id number]] Collected: 03/05/21 0503    Specimen: Blood Updated: 03/05/21 0543     Magnesium 2.0 mg/dL     Phosphorus [[id number]] Collected: 03/05/21 0503    Specimen: Blood Updated: 03/05/21 0543     Phosphorus 2.7 mg/dL     Basic Metabolic Panel [[id number]]  (Abnormal) Collected: 03/05/21 0503     Updated: 03/05/21 0543     Glucose 96 mg/dL      BUN 32.4 mg/dL      Creatinine 0.7 mg/dL      Calcium 8.7 mg/dL      Sodium 401 mEq/L      Potassium 4.0 mEq/L       Chloride 100 mEq/L      CO2 27 mEq/L      Anion Gap 4.0    GFR [[id number]] Collected: 03/05/21 0503     Updated: 03/05/21 0543     EGFR >60.0       CBC without differential [[id number]]  (Abnormal) Collected: 03/05/21 0503    Specimen: Blood Updated: 03/05/21 0527     WBC 8.22 x10 3/uL      Hgb 12.4 g/dL      Hematocrit 56.3 %      Platelets 175 x10 3/uL      RBC 3.85 x10 6/uL      MCV 96.4 fL      MCH 32.2 pg      MCHC 33.4 g/dL      RDW 13 %      MPV 9.4 fL      Nucleated RBC 0.0 /100 WBC      Absolute NRBC 0.00 x10 3/uL     GFR [[id number]] Collected: 03/04/21 2335     Updated: 03/05/21 0011     EGFR >60.0       Magnesium [[id number]] Collected: 03/04/21 2335     Updated: 03/05/21 0011     Magnesium 2.2 mg/dL     Basic Metabolic Panel [[id number]]  (Abnormal) Collected: 03/04/21 2335    Specimen: Blood Updated: 03/05/21 0011     Glucose 102 mg/dL      BUN 32.3 mg/dL      Creatinine 0.7 mg/dL      Calcium 9.0 mg/dL      Sodium 557 mEq/L      Potassium 3.9 mEq/L      Chloride 100 mEq/L      CO2 24 mEq/L      Anion Gap 5.0    Magnesium [[id number]] Collected: 03/04/21 2335    Specimen: Blood Updated: 03/04/21 2342    Magnesium [[id number]] Collected: 03/04/21 1720    Specimen: Blood Updated: 03/04/21 2133     Magnesium 2.3 mg/dL     Basic Metabolic Panel [[id number]]  (Abnormal) Collected: 03/04/21 1720    Specimen: Blood Updated: 03/04/21 1752     Glucose 105 mg/dL      BUN 37.1 mg/dL      Creatinine 0.7 mg/dL      Calcium 8.7 mg/dL      Sodium 062 mEq/L      Potassium 4.1 mEq/L      Chloride 101 mEq/L      CO2 25  mEq/L      Anion Gap 6.0    GFR [[id number]] Collected: 03/04/21 1720     Updated: 03/04/21 1752     EGFR >60.0       MRSA culture [[id number]] Collected: 03/04/21 0620    Specimen: Culturette from Nasal Swab Updated: 03/04/21 1228    MRSA culture [[id number]] Collected: 03/04/21 0620    Specimen: Culturette from Throat Updated: 03/04/21 1228    GFR [[id number]] Collected: 03/04/21 1154     Updated: 03/04/21 1218      EGFR >60.0       Basic Metabolic Panel [[id number]]  (Abnormal) Collected: 03/04/21 1154    Specimen: Blood Updated: 03/04/21 1218     Glucose 185 mg/dL      BUN 24.4 mg/dL      Creatinine 0.8 mg/dL      Calcium 8.1 mg/dL      Sodium 010 mEq/L      Potassium 3.8 mEq/L      Chloride 103 mEq/L      CO2 22 mEq/L      Anion Gap 7.0    ABO/Rh [[id number]] Collected: 03/04/21 0620    Specimen: Blood Updated: 03/04/21 0700     ABO Rh A POS    Type and Screen [[id number]] Collected: 03/04/21 0428    Specimen: Blood Updated: 03/04/21 0608     ABO Rh A POS     AB Screen Gel NEG                Radiology Results:      MRI Brain W WO Contrast   Final Result         1. Hemorrhagic contusion with hematoma in the lateral left temporal lobe   and smaller hemorrhagic contusion in the right parietal lobe, with   surrounding edema as detailed above.      2. Additional multifocal left greater than right subdural, left   subarachnoid and intraventricular hemorrhage as detailed. No midline   shift.      3. Mild smooth diffuse pachymeningeal enhancement. This finding could be   seen in the setting of intracranial hypotension, versus other meningeal   process. Correlate clinically, with CSF sampling if indicated.      4. Background mild global volume loss, chronic small vessel ischemic   changes and multiple chronic cerebellar infarcts.      Gaylyn Rong, MD    03/04/2021 9:55 PM                  Scribe Attestation:      No scribe involved in the care of this patient      Documentation Notes:     Parts of this note were generated by the Epic EMR system/ Dragon speech recognition and may contain inherent errors or omissions not intended by the user. Grammatical errors, random word insertions, deletions, pronoun errors and incomplete sentences are occasional consequences of this technology due to software limitations. Not all errors are caught or corrected.     My documentation is often completed after the patient is no longer under my  clinical care. In some cases, the Epic EMR may pull updated results into the above documentation which may not reflect all results or information that was available to me at the time of my medical decision making.      If there are questions or concerns about the content of this note or information contained within the body of this dictation they should be addressed  directly with the author for clarification.           Aggie Moats, MD  03/05/21 [phone]

## 2021-03-04 ENCOUNTER — Inpatient Hospital Stay: Payer: Medicare Other

## 2021-03-04 DIAGNOSIS — G4700 Insomnia, unspecified: Secondary | ICD-10-CM

## 2021-03-04 DIAGNOSIS — K5900 Constipation, unspecified: Secondary | ICD-10-CM

## 2021-03-04 DIAGNOSIS — R5381 Other malaise: Secondary | ICD-10-CM

## 2021-03-04 DIAGNOSIS — Z7189 Other specified counseling: Secondary | ICD-10-CM

## 2021-03-04 DIAGNOSIS — Z66 Do not resuscitate: Secondary | ICD-10-CM

## 2021-03-04 DIAGNOSIS — R000 Tachycardia, unspecified: Secondary | ICD-10-CM

## 2021-03-04 DIAGNOSIS — Z9189 Other specified personal risk factors, not elsewhere classified: Secondary | ICD-10-CM

## 2021-03-04 LAB — CBC
Absolute NRBC: 0 10*3/uL (ref 0.00–0.00)
Hematocrit: 33.1 % — ABNORMAL LOW (ref 34.7–43.7)
Hgb: 11.6 g/dL (ref 11.4–14.8)
MCH: 33 pg (ref 25.1–33.5)
MCHC: 35 g/dL (ref 31.5–35.8)
MCV: 94.3 fL (ref 78.0–96.0)
MPV: 9.7 fL (ref 8.9–12.5)
Nucleated RBC: 0 /100 WBC (ref 0.0–0.0)
Platelets: 173 10*3/uL (ref 142–346)
RBC: 3.51 10*6/uL — ABNORMAL LOW (ref 3.90–5.10)
RDW: 13 % (ref 11–15)
WBC: 11.3 10*3/uL — ABNORMAL HIGH (ref 3.10–9.50)

## 2021-03-04 LAB — MAGNESIUM
Magnesium: 1.8 mg/dL (ref 1.6–2.6)
Magnesium: 2.3 mg/dL (ref 1.6–2.6)

## 2021-03-04 LAB — BASIC METABOLIC PANEL
Anion Gap: 5 (ref 5.0–15.0)
Anion Gap: 7 (ref 5.0–15.0)
Anion Gap: 6 (ref 5.0–15.0)
BUN: 14 mg/dL (ref 7.0–19.0)
BUN: 10 mg/dL (ref 7.0–19.0)
BUN: 12 mg/dL (ref 7.0–19.0)
CO2: 26 mEq/L (ref 22–29)
CO2: 22 mEq/L (ref 22–29)
CO2: 25 mEq/L (ref 22–29)
Calcium: 8.5 mg/dL (ref 7.9–10.2)
Calcium: 8.1 mg/dL (ref 7.9–10.2)
Calcium: 8.7 mg/dL (ref 7.9–10.2)
Chloride: 96 mEq/L — ABNORMAL LOW (ref 100–111)
Chloride: 103 mEq/L (ref 100–111)
Chloride: 101 mEq/L (ref 100–111)
Creatinine: 0.7 mg/dL (ref 0.6–1.0)
Creatinine: 0.8 mg/dL (ref 0.6–1.0)
Creatinine: 0.7 mg/dL (ref 0.6–1.0)
Glucose: 113 mg/dL — ABNORMAL HIGH (ref 70–100)
Glucose: 185 mg/dL — ABNORMAL HIGH (ref 70–100)
Glucose: 105 mg/dL — ABNORMAL HIGH (ref 70–100)
Potassium: 4.3 mEq/L (ref 3.5–5.1)
Potassium: 3.8 mEq/L (ref 3.5–5.1)
Potassium: 4.1 mEq/L (ref 3.5–5.1)
Sodium: 127 mEq/L — ABNORMAL LOW (ref 136–145)
Sodium: 132 mEq/L — ABNORMAL LOW (ref 136–145)
Sodium: 132 mEq/L — ABNORMAL LOW (ref 136–145)

## 2021-03-04 LAB — GFR
EGFR: >60
EGFR: >60
EGFR: >60

## 2021-03-04 LAB — TYPE AND SCREEN
AB Screen Gel: NEGATIVE
ABO Rh: A POS

## 2021-03-04 LAB — ABO/RH: ABO Rh: A POS

## 2021-03-04 LAB — IHS 2ND ABORH REQUEST

## 2021-03-04 MED ORDER — AMLODIPINE BESYLATE 5 MG PO TABS
5.0000 mg | ORAL_TABLET | Freq: Every day | ORAL | Status: DC
Start: 2021-03-04 — End: 2021-03-04

## 2021-03-04 MED ORDER — ACETAMINOPHEN 325 MG PO TABS
650.0000 mg | ORAL_TABLET | Freq: Four times a day (QID) | ORAL | Status: DC
Start: 2021-03-04 — End: 2021-03-06
  Administered 2021-03-04 – 2021-03-06 (×8): 650 mg via ORAL
  Filled 2021-03-04 (×8): qty 2

## 2021-03-04 MED ORDER — RALOXIFENE HCL 60 MG PO TABS
60.0000 mg | ORAL_TABLET | Freq: Every day | ORAL | Status: DC
Start: 2021-03-04 — End: 2021-03-04
  Filled 2021-03-04 (×2): qty 1

## 2021-03-04 MED ORDER — AMLODIPINE BESYLATE 5 MG PO TABS
5.0000 mg | ORAL_TABLET | Freq: Every evening | ORAL | Status: DC
Start: 2021-03-04 — End: 2021-03-06
  Administered 2021-03-04 – 2021-03-05 (×2): 5 mg via ORAL
  Filled 2021-03-04 (×2): qty 1

## 2021-03-04 MED ORDER — ONDANSETRON HCL 4 MG/2ML IJ SOLN
4.0000 mg | Freq: Three times a day (TID) | INTRAMUSCULAR | Status: DC | PRN
Start: 2021-03-04 — End: 2021-03-06

## 2021-03-04 MED ORDER — DIGOXIN 125 MCG PO TABS
0.1250 mg | ORAL_TABLET | ORAL | Status: DC
Start: 2021-03-04 — End: 2021-03-06
  Administered 2021-03-04 – 2021-03-06 (×2): 0.125 mg via ORAL
  Filled 2021-03-04 (×2): qty 1

## 2021-03-04 MED ORDER — ROSUVASTATIN CALCIUM 10 MG PO TABS
20.0000 mg | ORAL_TABLET | Freq: Every evening | ORAL | Status: DC
Start: 2021-03-04 — End: 2021-03-06
  Administered 2021-03-04 – 2021-03-05 (×2): 20 mg via ORAL
  Filled 2021-03-04: qty 4
  Filled 2021-03-04: qty 2

## 2021-03-04 MED ORDER — AMLODIPINE BESYLATE 5 MG PO TABS
2.5000 mg | ORAL_TABLET | Freq: Every evening | ORAL | Status: DC
Start: 2021-03-04 — End: 2021-03-04

## 2021-03-04 MED ORDER — DIGOXIN 250 MCG PO TABS
0.2500 mg | ORAL_TABLET | ORAL | Status: DC
Start: 2021-03-05 — End: 2021-03-06
  Administered 2021-03-05: 08:00:00 0.25 mg via ORAL
  Filled 2021-03-04 (×2): qty 1

## 2021-03-04 MED ORDER — MAGNESIUM SULFATE IN D5W 1-5 GM/100ML-% IV SOLN
1.0000 g | Freq: Once | INTRAVENOUS | Status: AC
Start: 2021-03-04 — End: 2021-03-04
  Administered 2021-03-04: 07:00:00 1 g via INTRAVENOUS
  Filled 2021-03-04: qty 100

## 2021-03-04 MED ORDER — RALOXIFENE HCL 60 MG PO TABS
60.0000 mg | ORAL_TABLET | Freq: Every evening | ORAL | Status: DC
Start: 2021-03-04 — End: 2021-03-06
  Administered 2021-03-04 – 2021-03-05 (×2): 60 mg via ORAL
  Filled 2021-03-04 (×3): qty 1

## 2021-03-04 MED ORDER — ACETAMINOPHEN 325 MG PO TABS
650.0000 mg | ORAL_TABLET | Freq: Four times a day (QID) | ORAL | Status: DC
Start: 2021-03-04 — End: 2021-03-04

## 2021-03-04 MED ORDER — SODIUM CHLORIDE 0.9 % IV SOLN
INTRAVENOUS | Status: DC
Start: 2021-03-04 — End: 2021-03-04

## 2021-03-04 MED ORDER — LEVOTHYROXINE SODIUM 88 MCG PO TABS
88.0000 ug | ORAL_TABLET | Freq: Every day | ORAL | Status: DC
Start: 2021-03-04 — End: 2021-03-06
  Administered 2021-03-04 – 2021-03-06 (×3): 88 ug via ORAL
  Filled 2021-03-04 (×4): qty 1

## 2021-03-04 MED ORDER — SODIUM CHLORIDE 3 % IV BOLUS
250.0000 mL | Freq: Once | INTRAVENOUS | Status: AC
Start: 2021-03-04 — End: 2021-03-04
  Administered 2021-03-04: 07:00:00 250 mL via INTRAVENOUS
  Filled 2021-03-04: qty 500

## 2021-03-04 MED ORDER — GADOTERATE MEGLUMINE 7.5 MMOL/15ML IV SOLN (CLARISCAN)
0.2000 mL/kg | Freq: Once | INTRAVENOUS | Status: AC | PRN
Start: 2021-03-04 — End: 2021-03-04
  Administered 2021-03-04: 21:00:00 12 mL via INTRAVENOUS

## 2021-03-04 NOTE — SLP Eval Note (Signed)
[facility]   Speech and Language Therapy Evaluation     Patient: [name]    MRN#: [id number]     Consult received for [name] for SLP Evaluation and Treatment.    Assessment:   Clinical Impression: [name] is a 87 y.o. female admitted 03/03/2021 for Nontraumatic acute subdural hemorrhage [I62.01]  Fall, initial encounter [W19.XXXA] presenting with functional speech, language, and cognition. Speech is fluent and intelligible, no evidence of aphasia. Adequate attention, working memory, and recall. Mildly reduced problem solving and safety awareness, suspect baseline 2/2 age. Pt is at baseline. No further skilled SLP services warranted.     Therapy Diagnosis: cognition WFL     Discharge Recommendations:   Expected disposition: Recommendations: No follow up required     Plan:   Plan:             History:   Current Hospitalization:  Referring Physician: Lowanda Foster, MD  Date of Referral: 03/04/21    [name] is a 87 y.o. female admitted on 03/03/2021 with "after Fall: Yes.  Patient is visiting from [facility] and was getting ready to go to her son's house for dinner when she fell and hit her head. She is amnestic to the event.   Currently she endorses headache and sinus pain.   Per daughter the patient has a walker at home but does not utilize it often. Her last fall prior to this was approximately 1.5 years ago and at the time also had a ICH.   She is not on any AP or AC" per H&P     Imaging:  CT Head without Contrast    Result Date: 03/03/2021  1. Small acute left subdural hematoma overlying the left frontal parietal lobe. 2. Ovoid hyperdense masslike nodule in the left parietotemporal lobe. Differential considerations include mass versus focal evolving intrapedicle hemorrhage. Recommend further evaluation with contrast-enhanced MRI. Findings were discussed with [name] unit on 03/03/2021 at 7:40 PM Susan Portela, MD  03/03/2021 7:59 PM    CT Cervical Spine  without Contrast    Result Date: 03/03/2021  1. No acute bony abnormality of the cervical spine. 2. Straightening cervical spine may relate to position or spasm. 3. Degenerative changes of the lower cervical spine. Susan Portela, MD  03/03/2021 7:39 PM      Medical Diagnosis: Nontraumatic acute subdural hemorrhage [I62.01]  Fall, initial encounter [W19.XXXA]    Patient Active Problem List   Diagnosis    Nontraumatic acute subdural hemorrhage        Past Medical/Surgical History:  Past Medical History:   Diagnosis Date    Bleeding in head following injury with loss of consciousness     a year ago    Disorder of thyroid     Hyperlipidemia     Hypertension     Irregular heartbeat      History reviewed. No pertinent surgical history.     Social History:          Subjective:   Patient is agreeable to participation in the therapy session. Family and/or guardian are agreeable to patient's participation in the therapy session. Nursing clears patient for therapy. Patient's medical condition is appropriate for Speech therapy intervention at this time. Pt reports cognition is at baseline. Daughter states pt has brief periods of mild confusion, but mostly back to normal.     PAIN: denies     Objective:   Observation of Patient/Vital Signs:  Patient is seated  in a bedside chair with dressings and telemetry in place.    Cognition:  Orientation: +name, +DOB, +place, +month, +year, -date, +situation   Memory:    - Immediate recall: WFL   - Long term memory: good for biographical/familial and work history   Attention:    - MOY backwards: 100%   - Divergent naming: named 10 items in 60 seconds    Reasoning:    - Convergent reasoning: 5/5    - Safety awareness: 1/2 (not oriented to fall precautions)    - Problem solving: 4/5      Speech/voice:  Fluent speech   100% intelligible     Auditory Comprehension:  Able to participate in conversation without difficulty   1 step directions: 100%    Reading Comprehension:  Not tested     Verbal  Expression:   Fluent speech   Divergent naming: named 10 words in 60 seconds     Written Expression:  Not tested     Pragmatics:   Appropriate eye contact and conversational turn taking.      Behavior:  Calm, cooperative, pleasant     Educated the patient to role of speech therapy, plan of care, goals of therapy and recommendations.    Patient left with call bell within reach, all needs met, fall mat in place and all questions answered. RN notified of session outcome and patient response.    Goals:   N/a    Luther Redo, M.S., CCC-SLP   Pager [phone], available via EpicChat    PPE Worn by Provider: gloves and face mask     Time of treatment:   SLP Received On: 03/04/21  Start Time: 1055  Stop Time: 1110  Time Calculation (min): 15 min

## 2021-03-04 NOTE — Progress Notes (Signed)
NEUROSURGERY ICU PROGRESS NOTE    Date Time: 03/04/21 4:57 AM  Patient Name: [name]  Consulting Attending Physician: Dr. Adrienne Mocha  Covered By: Team A: [phone]    Assessment:   [name] is a 87 y.o. female with presenting with small L frontoparietal SDH and focal hemorrhage of L parietotemporal concerning for mass after syncopal episode. GCS 15.     Plan:   - Primary medical management per STICU  - q4h neurochecks  - SBP goal 100-160  - Na goal 135-145  - Maintain normothermia, normoglycemia  - Stat CTH for neuro decline.  - HOB >30 degrees  - MRI brain wwo contrast (ordered)  - Multimodal pain control with QD bowel regimen.  - PT/OT eval and treat  - No therapeutic anticoagulation/antiplatelets/NSAIDS  - DVT prophylaxis  - SCDs  - Chemo DVT PPx: hold    Plan formulated and discussed with Dr. Adrienne Mocha.     Interim History:       24h Events:   - NAEO  - afeb, vss  - auop  - exam stable  Last Na: 130    Physical Exam:     Vitals:    03/04/21 0400   BP: 99/55   Pulse: 70   Resp: 15   Temp:    SpO2: 96%       Intake and Output Summary (Last 24 hours) at Date Time    Intake/Output Summary (Last 24 hours) at 03/04/2021 0457  Last data filed at 03/04/2021 0300  Gross per 24 hour   Intake 100 ml   Output 550 ml   Net -450 ml           General Appearance: NAD  HEENT: atraumatic, no CSF in nasal vault  CV: Hemodynamically stable, appears well-perfused  Pulm: Equal chest rise, normal effort, no respiratory distress  GI: soft, NTND  Ext: No edema, distal pulses present  Skin: No wounds    NEURO EXAM:  GCS: e4 / v5 / m6    Mental Status:  Sedation: Not on sedation.                Opens eyes:  Spont  Orientation: A&Ox4.         Language:  Fluent, spontaneous speech. Comprehension intact, repetition and naming normal. Follows 1-step commands with midline cross. Attends to interviewer. Provides adequate medical history.          Cranial Nerves  II: OS: size/reactivity: 62mm/Reactive                  OD:  size/reactivity: 14mm/Reactive    III/IV/VI: EOMI. Smooth horizontal pursuit and saccades.  V: Sensation intact V1-V3 to LT  VII: Face symmetric at rest and with grimace/eye closure/eyebrow raise.  VIII: Hearing intact to conversation. No primary or gaze-evoked nystagmus.  IX/X: Palate elevation symmetric. Uvula midline.  XI: SCM/SS strength 5/5 bilaterally. Normal bulk.  XII: Tongue midline without atrophy or fasciculations.      Motor:  Normal bulk and tone throughout. No abnormal movements.  Pronator Drift: Absent    Upper Extremity  Motor   LEFT 5/5    RIGHT 5/5     Lower Extremity Motor   LEFT 5/5   RIGHT 5/5     Sensory: Sensation intact to LT throughout. No spinal level. Romberg deferred.    Cerebellar: Normal FnF bilaterally   Gait: Deferred.    Medications:     Current Facility-Administered Medications   Medication Dose Route Frequency  acetaminophen  1,000 mg Intravenous Q8H SCH    amLODIPine  2.5 mg Oral QHS    amLODIPine  5 mg Oral Daily    digoxin  0.125 mg Oral Once per day on Sun Tue Thu Sat    [START ON 03/05/2021] digoxin  0.25 mg Oral Once per day on Mon Wed Fri    levothyroxine  88 mcg Oral Daily at 0600    raloxifene  60 mg Oral Daily    rosuvastatin  20 mg Oral QHS       Labs:     Lab Results   Component Value Date    WBC 8.93 03/03/2021    HGB 12.9 03/03/2021    HCT 37.9 03/03/2021    MCV 93.3 03/03/2021    PLT 172 03/03/2021     Lab Results   Component Value Date    NA 130 (L) 03/03/2021    K 3.9 03/03/2021    CL 97 (L) 03/03/2021    CO2 21 (L) 03/03/2021     Lab Results   Component Value Date    INR 1.0 03/03/2021    PT 12.0 03/03/2021       Rads:     Radiology Results (24 Hour)       ** No results found for the last 24 hours. Izora Gala, MD  PGY-2  Neurosurgery  03/04/21 4:57 AM
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
## 2021-03-04 NOTE — UM Notes (Addendum)
03/03/21 2303  Adult Admit to Inpatient ([facility] Only)  Once        Comments: TICU.   Diagnosis: Nontraumatic Acute Subdural Hemorrhage    Level of Care: ICU    Patient Class: Inpatient       References:    [facility] Bed Placement Criteria    [facility] Bed Placement Criteria    [facility] Bed Placement Criteria    [facility] Bed Placement Criteria    [facility] Bed Placement Criteria   Question Answer Comment   Admitting Physician Era Skeen A    Service: Trauma    Estimated Length of Stay > or = to 2 midnights    Tentative Discharge Plan? Home or Self Care    Does patient need telemetry? No              ED from 03/03/2021 in [facility] Emergency Department    87 y.o. female with no known medical history was brought in by EMS for a right-sided head injury s/p possible syncopal episode today.  2 episodes of emesis en route and patient given Zofran.  Patient has no recollection of incident.     Tx from OSH for SDH s/p fall.   CT shows, small left SDH  -> Left parietal.  measures 1.9 x 1.2 cm. ? intraparenchymal hemorrhage measuring 1.9 x 1.2 cm in the L parietotemporal lobe      Neurosurgery Note:      Reason for Consultation:   small left frontal/parietal subdural hematoma; left parietotemporal mass/nodule/lesion/hemorrhage     History:   [name] is a 87 y.o. right handed female who presents to the hospital on 03/03/2021 with small left frontal/parietal subdural hematoma; left parietotemporal mass/nodule/lesion/hemorrhage. Patient was at her home tonight and had a syncopal event, causing her to faint and fall. Patient was initially evaluated at [facility] ER where HCT was completed, showing the small left SDH, and the hyperdense area, prompting transfer to [facility] and Neurosurgical consult. At time of exam, patient is awake and alert, oriented x3, following commands, with no obvious focal weakness. Patient reports moderate headache; denies current dizziness, weakness, numbness/tingling, neck/back pain, chest pain, abdominal pain,  cough/shortness of breath, fever/chills, vision/hearing changes, nausea/vomiting.     87 y.o. female with small left frontal/parietal subdural hematoma; left parietotemporal mass/nodule/lesion/hemorrhage; patient had a syncopal event and fell at her home. Patient is GCS 15 at time of exam    Na 130      97.8, 94-101, 18-26, 199/83      Labetolol 10 mg iv x 1     Plan:   Admit to Trauma Service: TICU  Medical management per Trauma Team  q 1 hour neuro checks  INR goal <1.3  No anticoagulation/Antiplatelets/NSAIDs  No AED unless patient has a seizure, due to small size of hemorrhage  MRI w/wo contrast, with Brain Lab and Synaptive Protocol  Repeat CT for neuro decline  HOB> 30 degrees    Trauma Note:    Plan by systems:  Neuro: L SDH with likely IPH, acute post traumatic pain  - NSGY: Q1 hr NC, HOB >30  - multimodal pain control regimen - try to refrain from narcotic use as patient has severe GI SE  Pulm: SORA   - pulm toilet/IS/mobilization   CV: HDS, hx HTN, afib on digoxin currently in sinus rhythm   - monitor VS   - SBP goal <160   - cont home meds   Endo: hypothyroidism  - cont home synthroid  GI: NPO, mIVF  - antiemetics prn   Heme/ID: afebrile, no abx indicated   Renal: monitor I/O   Neuromuscular: activity as tolerated  - fall precautions   - PT/OT   Psych: supportive   Wounds: lwc prn      Patient will be admitted to: ICU      8/16    Admitted to TICU for q1nc. 3% bolus     Surgery Note:      Presenting with small L frontoparietal SDH and focal hemorrhage of L parietotemporal concerning for mass after syncopal episode. GCS 15.       98.0, 70, 15, 99/55    Mag Level 1.8    Scheduled Medications:          Current Facility-Administered Medications   Medication Dose Route Frequency    acetaminophen  1,000 mg Intravenous Q8H SCH    amLODIPine  5 mg Oral QHS    digoxin  0.125 mg Oral Once per day on Sun Tue Thu Sat    [START ON 03/05/2021] digoxin  0.25 mg Oral Once per day on Mon Wed Fri    levothyroxine  88 mcg  Oral Daily at 0600    magnesium sulfate  1 g Intravenous Once    raloxifene  60 mg Oral QHS    rosuvastatin  20 mg Oral QHS    sodium chloride  250 mL Intravenous Once      Infusion Medications:    sodium chloride 75 mL/hr at 03/04/21 0609       Plan by systems:  Neuro: left SDH, Ovoid hyperdense masslike nodule in the left parietotemporal lobe  - f/u MRI for evaluation of left parietotemporal lobe  - appreciate NSGY recs: q1h NC, SBP 100-160, Na goal 135-145, HOB > 30  - 250cc 3% Na bolus this morning for hyponatremia  - on multimodal pain regimen  Seizure Prophylaxis not indicated due to small size  Pulm: stable on room air  - I/S, continue OOB and ambulation  CV: Afib on digoxin, HTN  - Continue home digoxin, amlodipine  - Hemodynamically appropriate, monitoring prn  Endo: hypothyroid  - Continue home synthyroid  - BGs stable, monitoring prn  GI:  - NPO pending SLP eval  - holding bowel reg while NPO  GI Prophylaxis:  anticipate starting diet later today  Heme/ID:  - afebrile, no signs of infxn   DVT Prophylaxis: contraindication due to active bleeding or high risk of bleeding   Renal:  - aUOP, voiding spont  Foley: no  Neuromuscular:  Weight Bearing Right Left   Upper Extremity WBAT WBAT   Lower Extremity WBAT WBAT   PT/OT: yes  Psych: supportive  Wounds: lwc prn  Disposition: STICU       Kendrick Ranch RN,BSN   PRN Utilization Review   [facility]   [address]   [address]   [place], [place] [address]   [place]: [phone]    Fax: [phone]

## 2021-03-04 NOTE — Plan of Care (Signed)
Pt A&O x4. MAE. FC. PERRLA noted. Diet ordered. Activity advanced. Pt tolerating both. Pain managed with tylenol. Neuros q4h now. Awaiting MRI.    Problem: Moderate/High Fall Risk Score >5  Goal: Patient will remain free of falls  Outcome: Progressing     Problem: Safety  Goal: Patient will be free from injury during hospitalization  Outcome: Progressing  Goal: Patient will be free from infection during hospitalization  Outcome: Progressing     Problem: Pain  Goal: Pain at adequate level as identified by patient  Outcome: Progressing     Problem: Side Effects from Pain Analgesia  Goal: Patient will experience minimal side effects of analgesic therapy  Outcome: Progressing     Problem: Discharge Barriers  Goal: Patient will be discharged home or other facility with appropriate resources  Outcome: Progressing     Problem: Psychosocial and Spiritual Needs  Goal: Demonstrates ability to cope with hospitalization/illness  Outcome: Progressing     Problem: Neurological Deficit  Goal: Neurological status is stable or improving  Outcome: Progressing     Problem: Potential for Aspiration  Goal: Risk of aspiration will be minimized  Outcome: Progressing     Problem: Impaired Mobility  Goal: Mobility/Activity is maintained at optimal level for patient  Outcome: Progressing     Problem: Nutrition  Goal: Nutritional intake is adequate  Outcome: Progressing     Problem: Compromised Tissue integrity  Goal: Nutritional status is improving  Outcome: Progressing
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
## 2021-03-04 NOTE — Plan of Care (Signed)
Problem: Moderate/High Fall Risk Score >5  Goal: Patient will remain free of falls  Outcome: Progressing  Flowsheets (Taken 03/04/2021 2000)  High (Greater than 13):   HIGH-Pharmacy to initiate evaluation and intervention per protocol   HIGH-Apply yellow "Fall Risk" arm band   HIGH-Utilize chair pad alarm for patient while in the chair   HIGH-Bed alarm on at all times while patient in bed     Problem: Safety  Goal: Patient will be free from injury during hospitalization  Outcome: Progressing  Flowsheets (Taken 03/04/2021 2029)  Patient will be free from injury during hospitalization:   Assess patient's risk for falls and implement fall prevention plan of care per policy   Ensure appropriate safety devices are available at the bedside   Include patient/ family/ care giver in decisions related to safety  Goal: Patient will be free from infection during hospitalization  Outcome: Progressing  Flowsheets (Taken 03/04/2021 2029)  Free from Infection during hospitalization:   Assess and monitor for signs and symptoms of infection   Monitor lab/diagnostic results     Problem: Pain  Goal: Pain at adequate level as identified by patient  Outcome: Progressing  Flowsheets (Taken 03/04/2021 2029)  Pain at adequate level as identified by patient:   Identify patient comfort function goal   Assess for risk of opioid induced respiratory depression, including snoring/sleep apnea. Alert healthcare team of risk factors identified.   Assess pain on admission, during daily assessment and/or before any "as needed" intervention(s)     Problem: Discharge Barriers  Goal: Patient will be discharged home or other facility with appropriate resources  Outcome: Progressing     Problem: Neurological Deficit  Goal: Neurological status is stable or improving  Outcome: Progressing  Flowsheets (Taken 03/04/2021 2029)  Neurological status is stable or improving:   Monitor/assess/document neurological assessment (Stroke: every 4 hours)   Monitor/assess NIH  Stroke Scale     Problem: Potential for Aspiration  Goal: Risk of aspiration will be minimized  Outcome: Progressing  Flowsheets (Taken 03/04/2021 0238)  Risk of aspiration will be minimized:   Assess and monitor ability to swallow   Place swallow precaution signage above bed

## 2021-03-04 NOTE — Plan of Care (Signed)
Problem: Potential for Aspiration  Goal: Risk of aspiration will be minimized  Outcome: Progressing  Flowsheets (Taken 03/04/2021 0238)  Risk of aspiration will be minimized:   Assess and monitor ability to swallow   Place swallow precaution signage above bed     Problem: Impaired Mobility  Goal: Mobility/Activity is maintained at optimal level for patient  Outcome: Progressing  Flowsheets (Taken 03/04/2021 0238)  Mobility/activity is maintained at optimal level for patient:   Increase mobility as tolerated/progressive mobility   Encourage independent activity per ability   Reposition patient every 2 hours and as needed unless able to reposition self     Problem: Communication Impairment  Goal: Will be able to express needs and understand communication  Outcome: Progressing  Flowsheets (Taken 03/04/2021 0238)  Able to express needs and understand communication:   Provide alternative method of communication if needed   Consult/collaborate with Speech Language Pathology (SLP)   Include patient care companion in decisions related to communication     Problem: Nutrition  Goal: Nutritional intake is adequate  Outcome: Progressing  Flowsheets (Taken 03/04/2021 0238)  Nutritional intake is adequate: Monitor daily weights

## 2021-03-04 NOTE — Progress Notes (Signed)
ICU ACUTE CARE SURGERY / TRAUMA PROGRESS NOTE     Date/Time: 03/04/21 6:29 AM  Patient Name: [name]  Primary Care Physician: Patsy Lager, MD  Hospital Day: 1     Post-op Day:      Assessment/Plan:   The patient has the following active problems:  Active Hospital Problems    Diagnosis    Nontraumatic acute subdural hemorrhage        SOFA Score  No data recorded     Plan by systems:  Neuro: left SDH, Ovoid hyperdense masslike nodule in the left parietotemporal lobe  - f/u MRI for evaluation of left parietotemporal lobe  - appreciate NSGY recs: q1h NC, SBP 100-160, Na goal 135-145, HOB > 30  - 250cc 3% Na bolus this morning for hyponatremia  - on multimodal pain regimen  Seizure Prophylaxis not indicated due to small size  Pulm: stable on room air  - I/S, continue OOB and ambulation  CV: Afib on digoxin, HTN  - Continue home digoxin, amlodipine  - Hemodynamically appropriate, monitoring prn  Endo: hypothyroid  - Continue home synthyroid  - BGs stable, monitoring prn  GI:  - NPO pending SLP eval  - holding bowel reg while NPO  GI Prophylaxis:  anticipate starting diet later today  Heme/ID:  - afebrile, no signs of infxn   DVT Prophylaxis: contraindication due to active bleeding or high risk of bleeding   Renal:  - aUOP, voiding spont  Foley: no  Neuromuscular:  Weight Bearing Right Left   Upper Extremity WBAT WBAT   Lower Extremity WBAT WBAT   PT/OT: yes  Psych: supportive  Wounds: lwc prn  Disposition: STICU     NSGY: Adrienne Mocha    Interval History:   [name] is a 87 y.o. female who presents to the hospital after Fall: Yes.     Significant overnight events include  - Na 127, 3%Na bolus ordered    Allergies:   No Known Allergies    Medications:     Scheduled Medications:   Current Facility-Administered Medications   Medication Dose Route Frequency    acetaminophen  1,000 mg Intravenous Q8H SCH    amLODIPine  5 mg Oral QHS    digoxin  0.125 mg Oral Once per day on Sun Tue Thu Sat    [START ON  03/05/2021] digoxin  0.25 mg Oral Once per day on Mon Wed Fri    levothyroxine  88 mcg Oral Daily at 0600    magnesium sulfate  1 g Intravenous Once    raloxifene  60 mg Oral QHS    rosuvastatin  20 mg Oral QHS    sodium chloride  250 mL Intravenous Once     Infusion Medications:    sodium chloride 75 mL/hr at 03/04/21 0609     PRN Medications:   ondansetron     Labs:     Recent Labs   Lab 03/04/21  0428 03/03/21  1903   WBC 11.30* 8.93   RBC 3.51* 4.06   Hgb 11.6 12.9   Hematocrit 33.1* 37.9   Platelets 173 172   Glucose 113* 126*   BUN 14.0 16.0   Creatinine 0.7 0.7   Calcium 8.5 9.2   Sodium 127* 130*   Potassium 4.3 3.9   Chloride 96* 97*   CO2 26 21*       Rads:   Radiological Procedure reviewed.    CT Head without Contrast    Result Date: 03/03/2021  1. Small acute left subdural hematoma overlying the left frontal parietal lobe. 2. Ovoid hyperdense masslike nodule in the left parietotemporal lobe. Differential considerations include mass versus focal evolving intrapedicle hemorrhage. Recommend further evaluation with contrast-enhanced MRI. Findings were discussed with Delorise Shiner unit on 03/03/2021 at 7:40 PM Wyatt Portela, MD  03/03/2021 7:59 PM    CT Cervical Spine without Contrast    Result Date: 03/03/2021  1. No acute bony abnormality of the cervical spine. 2. Straightening cervical spine may relate to position or spasm. 3. Degenerative changes of the lower cervical spine. Wyatt Portela, MD  03/03/2021 7:39 PM      Physical Exam:   Temp:  [97.8 F (36.6 C)-98.2 F (36.8 C)] 98.1 F (36.7 C)  Heart Rate:  [70-101] 70  Resp Rate:  [14-26] 15  BP: (99-199)/(51-86) 99/55       Invasive ICU Hemodynamics:                 Vital Signs:  Vitals:    03/04/21 0400   BP: 99/55   Pulse: 70   Resp: 15   Temp:    SpO2: 96%       Vent Settings:             I/O:  Intake and Output Summary (Last 24 hours) at Date Time  No intake/output data recorded.    Output:           External Urinary Catheter-Urine Output (mL): 150 mL (03/04/21  0300)                           Nutrition:   Orders Placed This Encounter   Procedures    Diet NPO effective now       Physical Exam:  Physical Exam  Vitals and nursing note reviewed.   Constitutional:       Appearance: She is normal weight.   HENT:      Head: Normocephalic.      Mouth/Throat:      Mouth: Mucous membranes are moist.   Eyes:      Extraocular Movements: Extraocular movements intact.      Pupils: Pupils are equal, round, and reactive to light.   Cardiovascular:      Rate and Rhythm: Normal rate. Rhythm irregular.      Pulses: Normal pulses.   Pulmonary:      Effort: Pulmonary effort is normal.      Breath sounds: Normal breath sounds.   Abdominal:      General: Abdomen is flat. There is no distension.      Palpations: Abdomen is soft.      Tenderness: There is no abdominal tenderness.   Musculoskeletal:         General: Signs of injury (R hip bruising) present.      Cervical back: Normal range of motion.   Skin:     General: Skin is warm and dry.      Capillary Refill: Capillary refill takes less than 2 seconds.   Neurological:      General: No focal deficit present.      Mental Status: She is alert. Mental status is at baseline.      Comments: No neurologic deficits  Excellent mentation, rapidly answering orientation questions w/o confusion or hesitation   Psychiatric:         Mood and Affect: Mood normal.         Behavior: Behavior normal.  Attending Attestation:

## 2021-03-05 DIAGNOSIS — I6201 Nontraumatic acute subdural hemorrhage: Secondary | ICD-10-CM

## 2021-03-05 LAB — BASIC METABOLIC PANEL
Anion Gap: 4 — ABNORMAL LOW (ref 5.0–15.0)
Anion Gap: 5 (ref 5.0–15.0)
BUN: 11 mg/dL (ref 7.0–19.0)
BUN: 11 mg/dL (ref 7.0–19.0)
CO2: 24 mEq/L (ref 22–29)
CO2: 27 mEq/L (ref 22–29)
Calcium: 8.7 mg/dL (ref 7.9–10.2)
Calcium: 9 mg/dL (ref 7.9–10.2)
Chloride: 100 mEq/L (ref 100–111)
Chloride: 100 mEq/L (ref 100–111)
Creatinine: 0.7 mg/dL (ref 0.6–1.0)
Creatinine: 0.7 mg/dL (ref 0.6–1.0)
Glucose: 102 mg/dL — ABNORMAL HIGH (ref 70–100)
Glucose: 96 mg/dL (ref 70–100)
Potassium: 3.9 mEq/L (ref 3.5–5.1)
Potassium: 4 mEq/L (ref 3.5–5.1)
Sodium: 129 mEq/L — ABNORMAL LOW (ref 136–145)
Sodium: 131 mEq/L — ABNORMAL LOW (ref 136–145)

## 2021-03-05 LAB — GFR
EGFR: >60
EGFR: >60

## 2021-03-05 LAB — CBC
Absolute NRBC: 0 10*3/uL (ref 0.00–0.00)
Hematocrit: 37.1 % (ref 34.7–43.7)
Hgb: 12.4 g/dL (ref 11.4–14.8)
MCH: 32.2 pg (ref 25.1–33.5)
MCHC: 33.4 g/dL (ref 31.5–35.8)
MCV: 96.4 fL — ABNORMAL HIGH (ref 78.0–96.0)
MPV: 9.4 fL (ref 8.9–12.5)
Nucleated RBC: 0 /100 WBC (ref 0.0–0.0)
Platelets: 175 10*3/uL (ref 142–346)
RBC: 3.85 10*6/uL — ABNORMAL LOW (ref 3.90–5.10)
RDW: 13 % (ref 11–15)
WBC: 8.22 10*3/uL (ref 3.10–9.50)

## 2021-03-05 LAB — MRSA CULTURE
Culture MRSA Surveillance: NEGATIVE
Culture MRSA Surveillance: NEGATIVE

## 2021-03-05 LAB — DIGOXIN LEVEL: Digoxin Level: 0.8 ng/mL (ref 0.5–2.0)

## 2021-03-05 LAB — PHOSPHORUS: Phosphorus: 2.7 mg/dL (ref 2.3–4.7)

## 2021-03-05 LAB — MAGNESIUM
Magnesium: 2 mg/dL (ref 1.6–2.6)
Magnesium: 2.2 mg/dL (ref 1.6–2.6)

## 2021-03-05 MED ORDER — SODIUM CHLORIDE 3 % IV BOLUS
250.0000 mL | Freq: Once | INTRAVENOUS | Status: AC
Start: 2021-03-05 — End: 2021-03-05
  Administered 2021-03-05: 05:00:00 250 mL via INTRAVENOUS

## 2021-03-05 NOTE — Progress Notes (Signed)
SW met with pt and pt's son to complete assessment.  Pt is visiting from [place] and will be discharging to her daughters home in [place]. Family to transport pt home at time of discharge. Anticipated discharge tomorrow 8/18.    Carin Hock., LMSW, ACM-SW  Social Worker II Care Management  [phone]  [facility]  [email or web address]

## 2021-03-05 NOTE — PT Eval Note (Signed)
[facility]   Physical Therapy Evaluation   Patient: [name]    MRN#: [id number]   Unit: [facility] TOWER 3  Bed: F313/F313.01      Post Acute Care Therapy Recommendations:   Discharge Recommendations: Home with no needs     Milestones to be reached to achieve recommendation: none  Anticipate achievement in n/a sessions    DME Recommended for Discharge: Patient already has needed equipment    Therapy discharge recommendations may change with patient status.  Please refer to most recent note for up-to-date recommendations.    Assessment:   Significant Findings: none    [name] is a 87 y.o. female admitted 03/03/2021.  Pt s/p fall with L SDH. Patient presents with no acute deficits, neuro intact, and demonstrating good strength and static/dynamic balance. Pt IND with sit > stand, transfers, and distant ambulation, initially with fww progressed to spc. No LOB or buckling noted with mobility. Pt at/near functional baseline, voicing no functional concerns. Educated to continue mobilization with RN staff to maintain level of function. Pt does not require acute PT services at this time, D/C from PT. Please re-consult if change in functional status.     No acute PT needs identified. Will SO and remain available should need arise.     Therapy Diagnosis: Eval for impaired functional mobility    Rehabilitation Potential: N/A    Treatment Activities: PT evaluation  Educated the patient to role of physical therapy, plan of care, goals of therapy and safety with mobility and ADLs, energy conservation techniques, discharge instructions, home safety.    Plan:   D/C acute PT services    Treatment/Interventions: Eval only    Risks/benefits/POC discussed with patient.       Precautions and Contraindications:   Falls    Activity Orders  Activity as tolerated    Consult received for [name] for PT Evaluation and Treatment.  Patient's medical condition is appropriate for  Physical Therapy intervention at this time.    History of Present Illness:   Taneil Lazarus is a 87 y.o. female admitted on 03/03/2021 with "L frontoparietal SDH and focal hemorrhage of L parietotemporal concerning for mass after syncopal episode. MRI negative" Per H&P    Medical Diagnosis: Nontraumatic acute subdural hemorrhage [I62.01]  Fall, initial encounter [W19.XXXA]    Past Medical/Surgical History:  PMH/PSH: reviewed  Past Medical History:   Diagnosis Date    Bleeding in head following injury with loss of consciousness     a year ago    Disorder of thyroid     Hyperlipidemia     Hypertension     Irregular heartbeat      History reviewed. No pertinent surgical history.    Imaging/Tests/Labs:  Rad: reviewed  CT Head without Contrast    Result Date: 03/03/2021  1. Small acute left subdural hematoma overlying the left frontal parietal lobe. 2. Ovoid hyperdense masslike nodule in the left parietotemporal lobe. Differential considerations include mass versus focal evolving intrapedicle hemorrhage. Recommend further evaluation with contrast-enhanced MRI. Findings were discussed with [name] unit on 03/03/2021 at 7:40 PM Wyatt Portela, MD  03/03/2021 7:59 PM    CT Cervical Spine without Contrast    Result Date: 03/03/2021  1. No acute bony abnormality of the cervical spine. 2. Straightening cervical spine may relate to position or spasm. 3. Degenerative changes of the lower cervical spine. Wyatt Portela, MD  03/03/2021 7:39 PM    MRI Brain  W WO Contrast    Result Date: 03/04/2021  1. Hemorrhagic contusion with hematoma in the lateral left temporal lobe and smaller hemorrhagic contusion in the right parietal lobe, with surrounding edema as detailed above. 2. Additional multifocal left greater than right subdural, left subarachnoid and intraventricular hemorrhage as detailed. No midline shift. 3. Mild smooth diffuse pachymeningeal enhancement. This finding could be seen in the setting of intracranial hypotension, versus  other meningeal process. Correlate clinically, with CSF sampling if indicated. 4. Background mild global volume loss, chronic small vessel ischemic changes and multiple chronic cerebellar infarcts. Gaylyn Rong, MD  03/04/2021 9:55 PM   Lab Results   Component Value Date/Time    HGB 12.4 03/05/2021 05:03 AM    HCT 37.1 03/05/2021 05:03 AM    K 4.0 03/05/2021 05:03 AM    NA 131 (L) 03/05/2021 05:03 AM    INR 1.0 03/03/2021 07:03 PM    TROPI <0.01 03/03/2021 07:03 PM       Social History:   Prior Level of Function: independent with ADLs and ambulation  Assistive Devices: spc for community amb  Baseline Activity: community ambulation  DME Currently at Home: spc  Home Living Arrangements: alone in [place], staying with daughter  Type of Home: single-level home  Home Layout: Single level    Subjective:   "I feel great"  Patient is agreeable to participation in the therapy session. Nursing clears patient for therapy.     Patient Goal: to get better    Pain: denies pain    Objective:   Patient received in bedside chair with ICU lines/leads in place.  Pt wore mask during therapy session:No      Neuro exam:  Intact/baseline    Cognition/Neuro Status  Arousal/Alertness: Appropriate responses to stimuli  Attention Span: Appears intact  Orientation Level: Oriented X4  Memory: Appears intact  Following Commands: Follows all commands and directions without difficulty  Safety Awareness: independent  Insights: Fully aware of deficits  Problem Solving: Able to problem solve independently  Behavior: calm;attentive;cooperative    Musculoskeletal Examination:  RUE ROM: WFL  LUE ROM: WFL  RLE ROM: WFL  LLE ROM: WFL    RUE Strength: WFL  LUE Strength: WFL  RLE Strength: WFL  LLE Strength: WFL    Functional Mobility:   Supine to Sit: NT, in bedside chair, verbalizing independent  Scooting: independent  Sit to Stand: independent  Stand to Sit: independent  Transfers: independent    PMP - Progressive Mobility Protocol   PMP  Activity: Step 7 - Walks out of Room  Distance Walked (ft) (Step 6,7): 1000 Feet    Ambulation:   Level of assistance required: Independent  Ambulation Distance: 1000 feet  Pattern: functional gait pattern, no LOB or buckling noted with dynamic balance in gait  Device Used: fww and progressed to spc  Weightbearing Status: no restrictions  Stair Management: nt, pt and son verbalizing no stairs needed at baseline  Balance:   Static Sitting: good  Dynamic Sitting: good  Static Standing: good  Dynamic Standing: good    Participation and Activity Tolerance  Participation Effort: Good  Endurance: Does not limit participation with activity    Patient left with call bell within reach, all needs met,   SCDs off (as found, RN aware),   fall mat down,   bed alarm n/a,   chair alarm on   and all questions answered. RN notified of session outcome and patient response.  PPE worn during session: Gloves and Procedural mask    Tech present: n/a  PPE worn by tech: n/a    Time of Treatment  PT Received On: 03/05/21  Start Time: 1110  Stop Time: 1145  Time Calculation (min): 35 min    Neal Dy, PT, DPT  Pager # [phone]

## 2021-03-05 NOTE — Progress Notes (Signed)
Initial Case Management Assessment and Discharge Planning  [facility]   Patient Name: [name], [name]   Date of Birth 31-Aug-1927   Attending Physician: Dani Gobble, MD   Primary Care Physician: Patsy Lager, MD   Length of Stay 2   Reason for Consult / Chief Complaint Fall        Situation   Admission DX:   1. Nontraumatic acute subdural hemorrhage    2. Fall, initial encounter        A/O Status: X 3    Patient admitted from: transfer from OSH  Admission Status: inpatient    Health Care Agent: Self  Name: Addaleigh Nicholls  Phone number: [phone]       Background     Advanced directive:   Received    Code Status:   NO CPR - SUPPORT OK     Residence: Multi-story home    PCP: PCP Not on File, MD  Patient Contact:   [phone] (home)     [phone] (mobile)     Emergency contact:   Extended Emergency Contact Information  Primary Emergency Contact: Shute,[name]  Mobile Phone: [phone]  Relation: Daughter  Preferred language: English  Interpreter needed? No      ADL/IADL's: Independent  Previous Level of function: 7 Independent     DME: Ephraim Hamburger    Pharmacy:     [facility] [phone] [address], [place] - [phone] AT [facility] [address]  [address]  [place] [address]  Phone: [phone] Fax: [phone]      Prescription Coverage: Yes    Home Health: The patient is not currently receiving home health services.    Previous SNF/AR: N/A    COVID Vaccine Status: Vaccinated and Boosted    Date First IMM given: N/A  UAI on file?: No  Transport for discharge? Mode of transportation: Sales executive - Family/Friend to drive patient  Agreeable to Home with family post-discharge:  Yes     Assessment   Assessment completed at bedside with pt and pts son.     BARRIERS TO DISCHARGE: N/A     Recommendation   D/C Plan A: Home with family    D/C Plan B: Home with family    D/C Plan C: Home with family

## 2021-03-05 NOTE — Discharge Summary (Shared)
DISCHARGE NOTE    Date Time: 03/05/21 3:14 PM  Patient Name: [name]  Attending Physician: Dani Gobble, MD      Date of Admission:   03/03/2021    Date of Discharge:   ***    Reason for Admission:   Nontraumatic acute subdural hemorrhage [I62.01]  Fall, initial encounter [W19.XXXA]    Discharge Dx:   Present on Admission:   Nontraumatic acute subdural hemorrhage        Consultations:   Neurosurgery:  Joie Bimler, MD    Procedures performed:   None    HPI:   [name] is a 87 year old female that presents to the hospital after a fall. Patient was visiting from [place] and getting ready to go to son's house for dinner when she fell and hit her head. She is amnestic to the event. On admission she endorsed a headache and sinus pain. She had a fall approximately 1.5 years ago and at that time also had an ICH.     Hospital Course:   Patient seen as a Trauma Consult in the ED.   Neurosurgery consulted. Recommended every 1 hour neuro check, MRI with and without contrast with Brain lab and Synaptive protocol. Patient was admitted to the Surgery Trauma ICU for neurological checks. She was found to be hyponatremic and was given a 3% sodium bolus with NA trend.   MRI was done that indicated hemorrhagic contusion with hematoma in the left temporal lobe and smaller hemorrhagic contusion in the right parietal lobe with surrounding edema.   She remained neurologically intact.   She was seen by PT/OT and recommended home.       Discharge Medications:   {RES MED YQMVH:84696}    Disposition:   {RES DISPOSITION:22975}     Discharge Instructions:   Activity: {discharge activity:18261}  Diet: {diet:18262}  Wound Care: {wound care:20916}    Follow Up:   Mohseni, [place], [place] NP  [address]  [place] [address]  [phone]    Follow up in 1 week(s)  hct      Signed by: Jannette Spanner, NP

## 2021-03-05 NOTE — Discharge Summary (Incomplete)
DISCHARGE NOTE    Date Time: 03/05/21 2:46 PM  Patient Name: [name]  Attending Physician: Dani Gobble, MD      Date of Admission:   03/03/2021    Date of Discharge:   ***    Reason for Admission:   Nontraumatic acute subdural hemorrhage [I62.01]  Fall, initial encounter [W19.XXXA]    Discharge Dx:   Present on Admission:   Nontraumatic acute subdural hemorrhage        Consultations:   {RESCONSULTS:20899::"None"}    Procedures performed:   ***    HPI:   ***     Hospital Course:   ***     Discharge Medications:   {RES MED ZOXWR:60454}    Disposition:   {RES DISPOSITION:22975}     Discharge Instructions:   Activity: {discharge activity:18261}  Diet: {diet:18262}  Wound Care: {wound care:20916}    Follow Up:   Mohseni, [place], [place] NP  [address]  [place] [address]  [phone]    Follow up in 1 week(s)  hct      Signed by: Jannette Spanner, NP

## 2021-03-05 NOTE — OT Progress Note (Signed)
Occupational Therapy Cancellation Note    Patient: [name]  ZOX:[id number]    Unit: J811/B147.82    Patient not seen for occupational therapy secondary to per PT counterpart, pt is independent with ADLs with no acute care OT needs. Will complete OT orders at this time. Please re-consult if change in pt's functional status.     Mitchel Honour OTR/L  Pager [phone]

## 2021-03-05 NOTE — Progress Notes (Signed)
Pt transferred from TICU to NT8, A&O X4, VS stable, RA, HOH, no pain per pt, daughter at bedside, belongings verified and skin assessment done, all safety precautions in place, will monitor.

## 2021-03-05 NOTE — Progress Notes (Signed)
NEUROSURGERY ICU PROGRESS NOTE    Date Time: 03/05/21 8:10 AM  Patient Name: [name]  Consulting Attending Physician: Dr. Adrienne Mocha  Covered By: Team A: [phone]    Assessment:   [name] is a 87 y.o. female with presenting with small L frontoparietal SDH and focal hemorrhage of L parietotemporal concerning for mass after syncopal episode. MRI negative. GCS 15.     Plan:   - Primary medical management per STICU  - q4h neurochecks  - SBP goal 100-160  - Na goal 135-145  - Maintain normothermia, normoglycemia  - Stat CTH for neuro decline.  - HOB >30 degrees  - Multimodal pain control with QD bowel regimen.  - PT/OT eval and treat  - No therapeutic anticoagulation/antiplatelets/NSAIDS  - DVT prophylaxis  - SCDs  - Chemo DVT PPx: per trauma protocol    Please call with further questions, neuro decline, or new neuroimaging requiring our review. Please follow up in 1 wk with rHCT.     Plan formulated and discussed with Dr. Adrienne Mocha.     Interim History:       24h Events:   - NAEO  - afeb, vss  - auop  - exam stable  Last Na: 131    Physical Exam:     Vitals:    03/05/21 0800   BP: 157/73   Pulse: 85   Resp: 20   Temp: 98 F (36.7 C)   SpO2: 99%       Intake and Output Summary (Last 24 hours) at Date Time    Intake/Output Summary (Last 24 hours) at 03/05/2021 0810  Last data filed at 03/05/2021 0700  Gross per 24 hour   Intake 1065 ml   Output 3200 ml   Net -2135 ml             General Appearance: NAD  HEENT: atraumatic, no CSF in nasal vault  CV: Hemodynamically stable, appears well-perfused  Pulm: Equal chest rise, normal effort, no respiratory distress  GI: soft, NTND  Ext: No edema, distal pulses present  Skin: No wounds    NEURO EXAM:  GCS: e4 / v5 / m6    Mental Status:  Sedation: Not on sedation.                Opens eyes:  Spont  Orientation: A&Ox4.         Language:  Fluent, spontaneous speech. Comprehension intact, repetition and naming normal. Follows 1-step commands with midline cross. Attends  to interviewer. Provides adequate medical history.          Cranial Nerves  II: OS: size/reactivity: 58mm/Reactive                  OD: size/reactivity: 78mm/Reactive    III/IV/VI: EOMI. Smooth horizontal pursuit and saccades.  V: Sensation intact V1-V3 to LT  VII: Face symmetric at rest and with grimace/eye closure/eyebrow raise.  VIII: Hearing intact to conversation. No primary or gaze-evoked nystagmus.  IX/X: Palate elevation symmetric. Uvula midline.  XI: SCM/SS strength 5/5 bilaterally. Normal bulk.  XII: Tongue midline without atrophy or fasciculations.      Motor:  Normal bulk and tone throughout. No abnormal movements.  Pronator Drift: Absent    Upper Extremity  Motor   LEFT 5/5    RIGHT 5/5     Lower Extremity Motor   LEFT 5/5   RIGHT 5/5     Sensory: Sensation intact to LT throughout. No spinal level. Romberg deferred.  Cerebellar: Normal FnF bilaterally   Gait: Deferred.    Medications:     Current Facility-Administered Medications   Medication Dose Route Frequency    acetaminophen  650 mg Oral 4 times per day    amLODIPine  5 mg Oral QHS    digoxin  0.125 mg Oral Once per day on Sun Tue Thu Sat    digoxin  0.25 mg Oral Once per day on Mon Wed Fri    levothyroxine  88 mcg Oral Daily at 0600    raloxifene  60 mg Oral QHS    rosuvastatin  20 mg Oral QHS       Labs:     Lab Results   Component Value Date    WBC 8.22 03/05/2021    HGB 12.4 03/05/2021    HCT 37.1 03/05/2021    MCV 96.4 (H) 03/05/2021    PLT 175 03/05/2021     Lab Results   Component Value Date    NA 131 (L) 03/05/2021    K 4.0 03/05/2021    CL 100 03/05/2021    CO2 27 03/05/2021     Lab Results   Component Value Date    INR 1.0 03/03/2021    PT 12.0 03/03/2021       Rads:     Radiology Results (24 Hour)       Procedure Component Value Units Date/Time    MRI Brain W WO Contrast [[id number]] Collected: 03/04/21 2135    Order Status: Completed Updated: 03/04/21 2157    Narrative:      HISTORY: Left parietotemporal mass or lesion..    COMPARISON:  03/03/2021 CT head.    TECHNIQUE: MRI of the brain performed on a 3.0 Tesla scanner without and  with 12 mL of Clariscan intravenous contrast. Examination performed per  routine brain imaging protocol.     FINDINGS:  Acute appearing parenchymal hematoma in the lateral left temporal lobe  measuring approximately 2.4 x 2.3 cm (AP by TR), likely reflecting  hemorrhagic contusion. Perilesional edema with mass effect and partial  effacement of the temporal horn of the left lateral ventricle.     Small cortical/subcortical based foci of susceptibility in the inferior  right parietal lobe consistent with small microhemorrhages. Mild  surrounding edema.    Scattered areas of nonsuppression of sulcal FLAIR signal and sulcal  susceptibility most prominent in the parietal sulci and also in the left  frontal sulci consistent with sequela of subarachnoid hemorrhage, stable  compared to the prior CT.    Subdural hematoma along left cerebral convexity measuring maximally up  to 3 mm in thickness. Localized mass effect on the left cerebrum without  midline shift. Subdural blood products extending along the left falx and  left greater than right tentorial leaflets. There are also trace  layering blood products in the occipital horns of the lateral  ventricles.    The ventricles are within normal size limits for the degree of  background mild volume loss. Scattered periventricular and subcortical  T2 FLAIR hyperintensities in the supratentorial white matter consistent  with chronic small vessel ischemic changes. Chronic infarct with  hemosiderin staining in the bilateral cerebellar hemispheres. No  diffusion restriction to suggest recent infarct. The major intracranial  flow voids are maintained. No suspicious areas of increased  susceptibility.    Nonspecific smooth mild diffuse pachymeningeal enhancement.    Bilateral lens extractions. Otherwise, orbits are unremarkable. Trace  mucosal thickening in ethmoid air cells. Mild polypoid  mucosal  thickening in the left maxillary antrum. The remaining paranasal sinuses  and the right mastoid air cells are clear. Partial left mastoid  effusion.      Impression:          1. Hemorrhagic contusion with hematoma in the lateral left temporal lobe  and smaller hemorrhagic contusion in the right parietal lobe, with  surrounding edema as detailed above.    2. Additional multifocal left greater than right subdural, left  subarachnoid and intraventricular hemorrhage as detailed. No midline  shift.    3. Mild smooth diffuse pachymeningeal enhancement. This finding could be  seen in the setting of intracranial hypotension, versus other meningeal  process. Correlate clinically, with CSF sampling if indicated.    4. Background mild global volume loss, chronic small vessel ischemic  changes and multiple chronic cerebellar infarcts.    Gaylyn Rong, MD   03/04/2021 9:55 PM            Izora Gala, MD  PGY-2  Neurosurgery  03/05/21 8:10 AM

## 2021-03-05 NOTE — Nursing Progress Note (Signed)
Report called to [place], RN at 2005. Pt transferred from TICU to [facility]. Daughter at bedside. Pt transferred via wheelchair on tele, VS stable, on RA, A&Ox4, GCS 15, pt reports 0/10 pain. Pt's belongings, chart, and meds delivered with pt and placed in room. Pt transferred into bed w/ bed alarm on, call bell within reach, non-slip socks on, yellow gown on, glasses on, and all other high fall risk precautions in place.

## 2021-03-05 NOTE — Plan of Care (Signed)
Problem: Moderate/High Fall Risk Score >5  Goal: Patient will remain free of falls  Outcome: Progressing  Flowsheets (Taken 03/05/2021 0720)  High (Greater than 13):   MOD-Use of chair-pad alarm when appropriate   MOD-Remain with patient during toileting   MOD-Re-orient confused patients   MOD-Perform dangle, stand, walk (DSW) prior to mobilization   MOD-Include family in multidisciplinary POC discussions   HIGH-Visual cue at entrance to patient's room   HIGH-Utilize chair pad alarm for patient while in the chair   MOD-Use of assistive devices -Bedside Commode if appropriate   MOD-Utilize diversion activities   MOD-Request PT/OT consult order for patients with gait/mobility impairment   MOD-Place Fall Risk level on whiteboard in room   HIGH-Bed alarm on at all times while patient in bed   HIGH-Apply yellow "Fall Risk" arm band   HIGH-Initiate use of floor mats as appropriate     Problem: Safety  Goal: Patient will be free from injury during hospitalization  Outcome: Progressing  Flowsheets (Taken 03/04/2021 2029 by Sheron Nightingale, RN)  Patient will be free from injury during hospitalization:   Assess patient's risk for falls and implement fall prevention plan of care per policy   Ensure appropriate safety devices are available at the bedside   Include patient/ family/ care giver in decisions related to safety  Goal: Patient will be free from infection during hospitalization  Outcome: Progressing  Flowsheets (Taken 03/05/2021 0720)  Free from Infection during hospitalization:   Assess and monitor for signs and symptoms of infection   Monitor all insertion sites (i.e. indwelling lines, tubes, urinary catheters, and drains)   Monitor lab/diagnostic results   Encourage patient and family to use good hand hygiene technique     Problem: Pain  Goal: Pain at adequate level as identified by patient  Outcome: Progressing  Flowsheets (Taken 03/04/2021 2029 by Sheron Nightingale, RN)  Pain at adequate level as identified by  patient:   Identify patient comfort function goal   Assess for risk of opioid induced respiratory depression, including snoring/sleep apnea. Alert healthcare team of risk factors identified.   Assess pain on admission, during daily assessment and/or before any "as needed" intervention(s)     Problem: Side Effects from Pain Analgesia  Goal: Patient will experience minimal side effects of analgesic therapy  Outcome: Progressing  Flowsheets (Taken 03/05/2021 0720)  Patient will experience minimal side effects of analgesic therapy:   Monitor/assess patient's respiratory status (RR depth, effort, breath sounds)   Assess for changes in cognitive function   Prevent/manage side effects per LIP orders (i.e. nausea, vomiting, pruritus, constipation, urinary retention, etc.)   Evaluate for opioid-induced sedation with appropriate assessment tool (i.e. POSS)     Problem: Neurological Deficit  Goal: Neurological status is stable or improving  Outcome: Progressing  Flowsheets (Taken 03/05/2021 0720)  Neurological status is stable or improving:   Monitor/assess/document neurological assessment (Stroke: every 4 hours)   Perform CAM Assessment   Observe for seizure activity and initiate seizure precautions if indicated     Problem: Potential for Aspiration  Goal: Risk of aspiration will be minimized  Outcome: Progressing  Flowsheets (Taken 03/05/2021 0720)  Risk of aspiration will be minimized:   Monitor/assess for signs of aspiration (tachypnea, cough, wheezing, clearing throat, hoarseness after eating, decrease in SaO2)   Place patient up in chair to eat, if possible   Head of bed up 90 degrees to eat if unable to be out of bed   Supervise patient during oral intake  Instruct patient to take small bites   Instruct patient to take small single sips of liquid     Problem: Impaired Mobility  Goal: Mobility/Activity is maintained at optimal level for patient  Outcome: Progressing  Flowsheets (Taken 03/05/2021 0720)  Mobility/activity is  maintained at optimal level for patient:   Increase mobility as tolerated/progressive mobility   Perform active/passive ROM   Encourage independent activity per ability   Maintain proper body alignment   Plan activities to conserve energy, plan rest periods   Reposition patient every 2 hours and as needed unless able to reposition self   Assess for changes in respiratory status, level of consciousness and/or development of fatigue   Consult/collaborate with Physical Therapy and/or Occupational Therapy     Problem: Nutrition  Goal: Nutritional intake is adequate  Outcome: Progressing  Flowsheets (Taken 03/05/2021 0720)  Nutritional intake is adequate:   Monitor daily weights   Assist patient with meals/food selection   Allow adequate time for meals   Encourage/perform oral hygiene as appropriate   Include patient/patient care companion in decisions related to nutrition     Problem: Compromised Tissue integrity  Goal: Damaged tissue is healing and protected  Outcome: Progressing  Flowsheets (Taken 03/05/2021 0720)  Damaged tissue is healing and protected:   Monitor/assess Braden scale every shift   Reposition patient every 2 hours and as needed unless able to reposition self   Increase activity as tolerated/progressive mobility   Provide wound care per wound care algorithm   Relieve pressure to bony prominences for patients at moderate and high risk   Avoid shearing injuries   Keep intact skin clean and dry   Use bath wipes, not soap and water, for daily bathing   Use incontinence wipes for cleaning urine, stool and caustic drainage. Foley care as needed   Monitor external devices/tubes for correct placement to prevent pressure, friction and shearing   Monitor patient's hygiene practices   Encourage use of lotion/moisturizer on skin  Goal: Nutritional status is improving  Outcome: Progressing  Flowsheets (Taken 03/05/2021 0720)  Nutritional status is improving:   Include patient/patient care companion in decisions  related to nutrition   Allow adequate time for meals

## 2021-03-05 NOTE — Progress Notes (Signed)
ICU ACUTE CARE SURGERY / TRAUMA PROGRESS NOTE     Date/Time: 03/05/21 2:20 AM  Patient Name: [name]  Primary Care Physician: Patsy Lager, MD  Hospital Day: 2     Post-op Day:      Assessment/Plan:   The patient has the following active problems:  Active Hospital Problems    Diagnosis    Nontraumatic acute subdural hemorrhage        SOFA Score  No data recorded     Plan by systems:  Neuro: left SDH, Ovoid hyperdense masslike nodule in the left parietotemporal lobe  - f/u MRI for evaluation of left parietotemporal lobe  - appreciate NSGY recs: q1h NC, SBP 100-160, Na goal 135-145, HOB > 30  - 250cc 3% Na bolus this morning for hyponatremia  - on multimodal pain regimen  - MRI showed Hemorrhagic contusion  Seizure Prophylaxis not indicated due to small size  Pulm: stable on room air  - I/S, continue OOB and ambulation  CV: Afib on digoxin, HTN  - Continue home digoxin, amlodipine  - Hemodynamically appropriate, monitoring prn  - F/U digoxin level  Endo: hypothyroid  - Continue home synthyroid  - BGs stable, monitoring prn  GI:  - NPO pending SLP eval  - holding bowel reg while NPO  GI Prophylaxis:  anticipate starting diet later today  Heme/ID:  - afebrile, no signs of infxn   DVT Prophylaxis: contraindication due to active bleeding or high risk of bleeding   Renal:  - aUOP, voiding spont  Foley: no  Neuromuscular:  Weight Bearing Right Left   Upper Extremity WBAT WBAT   Lower Extremity WBAT WBAT   PT/OT: yes  Psych: supportive  Wounds: lwc prn  Disposition: STICU      NSGY: [place]     Interval History:   [name] is a 87 y.o. female who presents to the hospital after Fall: Yes.      Significant overnight events include  -Had 2 Episode of Irregular tachycardia in pt monitor most likely a.Fib for seconds, terminated by its self.    -Na 129, 3%Na bolus ordered  - MRI done showed Hemorrhagic contusion with hematoma in the lateral left temporal lobe and smaller hemorrhagic contusion in the  right parietal lobe, with surrounding edema as detailed above.     Allergies:   No Known Allergies    Medications:     Scheduled Medications:   Current Facility-Administered Medications   Medication Dose Route Frequency    acetaminophen  650 mg Oral 4 times per day    amLODIPine  5 mg Oral QHS    digoxin  0.125 mg Oral Once per day on Sun Tue Thu Sat    digoxin  0.25 mg Oral Once per day on Mon Wed Fri    levothyroxine  88 mcg Oral Daily at 0600    raloxifene  60 mg Oral QHS    rosuvastatin  20 mg Oral QHS     Infusion Medications:     PRN Medications:   ondansetron     Labs:     Recent Labs   Lab 03/04/21  2335 03/04/21  1720 03/04/21  1154 03/04/21  0428 03/03/21  1903   WBC  --   --   --  11.30* 8.93   RBC  --   --   --  3.51* 4.06   Hgb  --   --   --  11.6 12.9   Hematocrit  --   --   --  33.1* 37.9   Platelets  --   --   --  173 172   Glucose 102* 105* 185* 113* 126*   BUN 11.0 12.0 10.0 14.0 16.0   Creatinine 0.7 0.7 0.8 0.7 0.7   Calcium 9.0 8.7 8.1 8.5 9.2   Sodium 129* 132* 132* 127* 130*   Potassium 3.9 4.1 3.8 4.3 3.9   Chloride 100 101 103 96* 97*   CO2 24 25 22 26  21*       Rads:   Radiological Procedure reviewed.    MRI Brain W WO Contrast    Result Date: 03/04/2021  1. Hemorrhagic contusion with hematoma in the lateral left temporal lobe and smaller hemorrhagic contusion in the right parietal lobe, with surrounding edema as detailed above. 2. Additional multifocal left greater than right subdural, left subarachnoid and intraventricular hemorrhage as detailed. No midline shift. 3. Mild smooth diffuse pachymeningeal enhancement. This finding could be seen in the setting of intracranial hypotension, versus other meningeal process. Correlate clinically, with CSF sampling if indicated. 4. Background mild global volume loss, chronic small vessel ischemic changes and multiple chronic cerebellar infarcts. Gaylyn Rong, MD  03/04/2021 9:55 PM      Physical Exam:   Temp:  [97.6 F (36.4 C)-99 F (37.2 C)]  99 F (37.2 C)  Heart Rate:  [67-86] 74  Resp Rate:  [12-31] 14  BP: (99-156)/(51-71) 142/66       Invasive ICU Hemodynamics:                 Vital Signs:  Vitals:    03/05/21 0000   BP: 142/66   Pulse: 74   Resp: 14   Temp: 99 F (37.2 C)   SpO2: 98%       Vent Settings:             I/O:  Intake and Output Summary (Last 24 hours) at Date Time  I/O last 3 completed shifts:  In: 915 [P.O.:440; I.V.:375; IV Piggyback:100]  Out: 2300 [Urine:2300]    Output:           External Urinary Catheter-Urine Output (mL): 300 mL (03/05/21 0000)                           Nutrition:   Orders Placed This Encounter   Procedures    Diet regular       Physical Exam:  Physical Exam  Vitals reviewed.   Constitutional:       Appearance: Normal appearance.   HENT:      Head: Normocephalic.      Nose: Nose normal.      Mouth/Throat:      Mouth: Mucous membranes are moist.   Eyes:      Conjunctiva/sclera: Conjunctivae normal.   Cardiovascular:      Rate and Rhythm: Normal rate.   Pulmonary:      Effort: Pulmonary effort is normal.      Breath sounds: Normal breath sounds.   Abdominal:      General: Abdomen is flat.      Palpations: Abdomen is soft.   Musculoskeletal:         General: Normal range of motion.      Cervical back: Normal range of motion and neck supple.   Skin:     General: Skin is warm.      Capillary Refill: Capillary refill takes less than 2 seconds.   Neurological:  General: No focal deficit present.      Mental Status: She is alert and oriented to person, place, and time.         Attending Attestation:

## 2021-03-05 NOTE — Discharge Instr - AVS First Page (Addendum)
[facility] - Neurosurgery   [address]  [place], [place] [address]  Phone: [phone] and Fax: [phone]      Discharge Instructions  FOLLOW-UP:  You will need a follow-up in 1 weeks with [name], NP    IMAGING:  You will need a repeat CT head in 1 weeks, 1-2 days prior to your appointment.  Call the neurosurgery office for the CT scan order.  Please Call [facility] [phone] or [facility] to schedule your imaging [phone].     ACTIVITY:  Increase activity slowly; do not rapidly increase activity just because you feel good in the morning  If you feel lightheaded or fatigued after increasing activity, rest and decrease the amount you are doing to build activity slowly  Do not drive until you are cleared by your physician and insurance company. You may not drive if you have had a seizure.  Do not lift items over 5 lbs   Avoid activity that causes you to hold your breath and strain; for example lifting, moving heavy objects, straining during bowel movements.    SMOKING:  Smoking delays the healing process; we request that you avoid smoking if possible    EATING AND DRINKING:  You may resume a normal diet following your procedure. Please avoid alcoholic beverages while taking pain medication.    MEDICATION:   You may resume your usual medications after surgery except blood thinners, anti-inflammatory agents or anti-platelet drugs. You must discuss with your surgeon before resuming Aspirin, Coumadin, Plavix, Aggrenox, Heparin, Eliquis, Pradaxa, Celebrex, Meloxicam, Ibuprofen, or Naproxen.  Anti seizure medication may be prescribed to you upon discharge from the hospital. This is to decrease your risk of seizure. Make sure that you understand the medication instructions and take as prescribed. If you have any questions about your medications please call your surgeons office or your pharmacist.    PROBLEMS or CONCERNS:  Please call our office  with any problems or concerns or if you notice any of the following signs/symptoms  New symptoms including numbness, tingling, weakness in your face or extremities  Severe headache or change in headache  Hallucinations  Go immediately to the EMERGENCY ROOM if you have any of the following symptoms:  Confusion, fainting, blacking out, extreme fatigue, memory loss, difficulty speaking  Double or blurred vision (new), loss of vision, either partial or total  Stiff neck and/or temperature greater than 101.5?F  Severe sensitivity to light (photophobia) accompanied by severe headache or stiff neck  Seizure  Swollen painful calf with or without fever  Sudden shortness of breath and racing pulse    TRAUMA ACUTE CARE SURGERY DISCHARGE INSTRUCTIONS    Your primary management team during your stay was the Trauma Acute Care Surgery [facility]) team.  Our office/mailing address is:    Trauma Acute Care Services  [address]  [place], [place]  [address]  Phone: [phone]   FAX: [phone]    Follow Up Information:  Not all patients need to follow up with our trauma surgeons.  If you are recommended to do so, please call [phone] within 48 hours of your discharge from the hospital, acute rehab facility, or skilled nursing facility.  Our trauma clinic is held on Mondays.    Do you need to make an appointment to be seen in the trauma clinic?: You do not need to follow up in the trauma clinic. Please call with any questions or concerns.  - Please  follow up outpatient with Neurosurgery and your primary care provider, also with your cardiologist     Diet:  Regular   Activity: Please refrain from heaving lifting > 5 pounds, increase activity slowly as you can tolerate, do not drive until your follow up with Neurosurgery.   Wound Care: none   Additional special instructions upon discharge:    Avoid NSAIDs (Ibuprofen, Advil, motrin, Naproxen) and Aspirin or other blood thinners until you follow up with Neurosurgery  unless otherwise advised.    -Please return to the ER if you or your family notice:      - Any change in neurologic status (increased sleepiness, confusion, angry outburst)      - Nausea or vomiting      - Worsening headache       - Seizure like behavior (convulsions)     Wounds/Surgical Incisions:  You will be given specific instructions on how to care for your wound.  In general, dressings may be removed 48 hours after your operation or discharge and you may shower.  Do not soak your incisions.  If the wound should become red, warm or starts draining fluid, or you if you begin to have a fever or increased pain at the site, please contact our office.      Forms requiring a provider's signature:  If you have insurance forms, Family and Medical Leave (FMLA) forms, or worker's compensation forms that need to be filled out by your provider, please mail or fax the forms to our office at the mailing address listed above.    Pain Medication:  DO NOT drive or operate machinery while taking pain medication  Avoid drinking alcohol while taking pain medicine    The trauma service can manage your general postoperative or post-trauma pain. If your pain is from injuries or an operation that was handled or performed by another surgery team or consultant, such as Orthopedics, Neurosurgery, or Spine surgery, we kindly request that you call that surgeon's office to address your pain needs. If you have follow-up with the trauma service, please call the trauma office.  Please plan accordingly as refills can take up to 48-72 hours.    It is important to take your medications on time and never take more than the prescribed amount  Take with food to avoid an upset stomach  Medications take time to work, up to 20-30 minutes to take effect    If the pain lessens, try taking your pain medication less often  Eliminate a dose of pain medication at a time of day when you experience less pain    Initiate use of Tylenol as your pain decreases,  until you no longer need to take any medication for pain     Constipation is a common side effect of pain medications  You should be taking a stool softener (ex. Colace) as long as you are on any narcotic pain medication.    Eat lots of fruits and vegetables and keep hydrated   Over the counter laxatives and enemas are available at your local pharmacy       For emotional support resources after your traumatic injury, please contact the [facility] Coordinator, [place], at [phone] or [email or web address]

## 2021-03-06 ENCOUNTER — Other Ambulatory Visit (INDEPENDENT_AMBULATORY_CARE_PROVIDER_SITE_OTHER): Payer: Self-pay | Admitting: Nurse Practitioner

## 2021-03-06 DIAGNOSIS — I6201 Nontraumatic acute subdural hemorrhage: Secondary | ICD-10-CM

## 2021-03-06 LAB — ECG 12-LEAD
Atrial Rate: 87 {beats}/min
Atrial Rate: 86 {beats}/min
IHS MUSE NARRATIVE AND IMPRESSION: NORMAL
P Axis: 28 degrees
P Axis: 59 degrees
P-R Interval: 152 ms
P-R Interval: 148 ms
Q-T Interval: 408 ms
Q-T Interval: 416 ms
QRS Duration: 108 ms
QRS Duration: 104 ms
QTC Calculation (Bezet): 490 ms
QTC Calculation (Bezet): 497 ms
R Axis: 4 degrees
R Axis: 20 degrees
T Axis: 24 degrees
T Axis: 58 degrees
Ventricular Rate: 87 {beats}/min
Ventricular Rate: 86 {beats}/min

## 2021-03-06 LAB — BASIC METABOLIC PANEL
Anion Gap: 6 (ref 5.0–15.0)
BUN: 16 mg/dL (ref 7.0–19.0)
CO2: 22 mEq/L (ref 21–29)
Calcium: 8.3 mg/dL (ref 7.9–10.2)
Chloride: 103 mEq/L (ref 100–111)
Creatinine: 0.7 mg/dL (ref 0.4–1.5)
Glucose: 96 mg/dL (ref 70–100)
Potassium: 4.5 mEq/L (ref 3.5–5.1)
Sodium: 131 mEq/L — ABNORMAL LOW (ref 136–145)

## 2021-03-06 LAB — HEMOLYSIS INDEX: Hemolysis Index: 33 Index — ABNORMAL HIGH (ref 0–24)

## 2021-03-06 LAB — GFR: EGFR: >60

## 2021-03-06 MED ORDER — ACETAMINOPHEN 325 MG PO TABS
650.0000 mg | ORAL_TABLET | Freq: Four times a day (QID) | ORAL | Status: DC
Start: 2021-03-06 — End: 2021-04-12

## 2021-03-06 NOTE — Plan of Care (Signed)
Most Recent Set of Vitals   Visit Vitals  BP 147/59   Pulse 81   Temp 99 F (37.2 C) (Oral)   Resp 15   Ht 1.6 m (5\' 3" )   Wt 60 kg (132 lb 4.4 oz)   SpO2 99%   BMI 23.43 kg/m         Pt A&O X4, VSS, RA  Tolerated whole pills with thin liquids.  Pt able to ambulate  as well as reposition self in bed with 1  assistance.   Continent of bowel and bladder. LBM  8/15, Pt voiding via bathroom  No complains of pain  Good pedal pulses bilaterally,cap refill <3 sec in all 4 extremities.  Call bell and bedside table within reach at all times. Will continue to monitor patient closely.     Notable Shift Events     Problem: Moderate/High Fall Risk Score >5  Goal: Patient will remain free of falls  Outcome: Progressing  Flowsheets (Taken 03/06/2021 0224)  Moderate Risk (6-13):   MOD-Apply bed exit alarm if patient is confused   MOD-Consider activation of bed alarm if appropriate   LOW-Anticoagulation education for injury risk   LOW-Fall Interventions Appropriate for Low Fall Risk     Problem: Safety  Goal: Patient will be free from injury during hospitalization  Outcome: Progressing  Flowsheets (Taken 03/06/2021 0224)  Patient will be free from injury during hospitalization:   Include patient/ family/ care giver in decisions related to safety   Provide and maintain safe environment   Ensure appropriate safety devices are available at the bedside   Assess patient's risk for falls and implement fall prevention plan of care per policy   Use appropriate transfer methods  Goal: Patient will be free from infection during hospitalization  Outcome: Progressing  Flowsheets (Taken 03/06/2021 0224)  Free from Infection during hospitalization:   Monitor all insertion sites (i.e. indwelling lines, tubes, urinary catheters, and drains)   Assess and monitor for signs and symptoms of infection   Monitor lab/diagnostic results     Problem: Pain  Goal: Pain at adequate level as identified by patient  Outcome: Progressing  Flowsheets (Taken  03/06/2021 0224)  Pain at adequate level as identified by patient:   Assess pain on admission, during daily assessment and/or before any "as needed" intervention(s)   Assess for risk of opioid induced respiratory depression, including snoring/sleep apnea. Alert healthcare team of risk factors identified.   Identify patient comfort function goal   Evaluate patient's satisfaction with pain management progress     Problem: Side Effects from Pain Analgesia  Goal: Patient will experience minimal side effects of analgesic therapy  Outcome: Progressing  Flowsheets (Taken 03/06/2021 0224)  Patient will experience minimal side effects of analgesic therapy:   Assess for changes in cognitive function   Prevent/manage side effects per LIP orders (i.e. nausea, vomiting, pruritus, constipation, urinary retention, etc.)   Monitor/assess patient's respiratory status (RR depth, effort, breath sounds)     Problem: Discharge Barriers  Goal: Patient will be discharged home or other facility with appropriate resources  Outcome: Progressing  Flowsheets (Taken 03/06/2021 0224)  Discharge to home or other facility with appropriate resources:   Provide appropriate patient education   Provide information on available health resources   Initiate discharge planning     Problem: Psychosocial and Spiritual Needs  Goal: Demonstrates ability to cope with hospitalization/illness  Outcome: Progressing     Problem: Neurological Deficit  Goal: Neurological status is stable or improving  Outcome: Progressing     Problem: Potential for Aspiration  Goal: Risk of aspiration will be minimized  Outcome: Progressing     Problem: Impaired Mobility  Goal: Mobility/Activity is maintained at optimal level for patient  Outcome: Progressing     Problem: Communication Impairment  Goal: Will be able to express needs and understand communication  Outcome: Progressing     Problem: Nutrition  Goal: Nutritional intake is adequate  Outcome: Progressing     Problem:  Compromised Tissue integrity  Goal: Damaged tissue is healing and protected  Outcome: Progressing  Goal: Nutritional status is improving  Outcome: Progressing

## 2021-03-06 NOTE — Progress Notes (Signed)
TRAUMA TERTIARY SURVEY FORM AND INITIAL PROGRESS NOTE       Interval History:   [name] is a 87 y.o. female who was admitted 03/03/2021  9:04 PM to [facility] by Dani Gobble, MD after Fall: Yes.    Substance usage:   none  The patient denies current or previous tobacco use.  denied.    SBRT (Screening, Brief Intervention, and Referral to Treatment) performed: Yes  CATS (Community addiction team) requested: N/A    Allergies:   No Known Allergies    Medical history:     Past Medical History:   Diagnosis Date    Bleeding in head following injury with loss of consciousness     a year ago    Disorder of thyroid     Hyperlipidemia     Hypertension     Irregular heartbeat        Medications:     Home Medications:   Prior to Admission medications    Medication Sig Start Date End Date Taking? Authorizing Provider   amLODIPine (NORVASC) 5 MG tablet TAKE 1 TABLET BY MOUTH IN THE MORNING HALF TABLET THE EVENING 09/25/20   [provider]   diclofenac Sodium (VOLTAREN) 1 % Gel topical gel APPLY 1 GRAM EVERY 8 HOURS 12/28/20   [provider]   digoxin (LANOXIN) 0.125 MG tablet TAKE 1 TABLET BY MOUTH ONCE A DAY ON TUESDAY, THURSDAY, SATURDAY, AND SUNDAY. 12/10/20   [provider]   digoxin (LANOXIN) 0.25 MG tablet TAKE 1 TABLET BY MOUTH ON MONDAY/WEDNESDAY AND FRIDAY 90 DAY(S) 12/17/20   [provider]   Fungoid Tincture 2 % Solution APPLY TWICE A DAY TO TOE NAIL 12/26/20   [provider]   levothyroxine (SYNTHROID) 88 MCG tablet Take 88 mcg by mouth every morning 02/07/21   [provider]   lisinopril (ZESTRIL) 10 MG tablet TAKE 1 TABLET ORALLY ONCE A DAY EVERY MORNING FOR 30 DAY(S) 12/17/20   [provider]   meloxicam (MOBIC) 7.5 MG tablet Take 7.5 mg by mouth daily 04/10/20   [provider]   raloxifene (EVISTA) 60 MG tablet Take 60 mg by mouth daily 02/12/21   [provider]   rosuvastatin  (CRESTOR) 20 MG tablet Take 20 mg by mouth nightly 12/18/20   [provider]   urea (CARMOL) 40 % cream APPLY 1 GRAM ONCE A DAY TO NAILS AND CALLUSES 07/02/20   [provider]     Scheduled Medications:   Current Facility-Administered Medications   Medication Dose Route Frequency    acetaminophen  650 mg Oral 4 times per day    amLODIPine  5 mg Oral QHS    digoxin  0.125 mg Oral Once per day on Sun Tue Thu Sat    digoxin  0.25 mg Oral Once per day on Mon Wed Fri    levothyroxine  88 mcg Oral Daily at 0600    raloxifene  60 mg Oral QHS    rosuvastatin  20 mg Oral QHS     Infusion Medications:     PRN Medications:   ondansetron     Verify appropriate medications are reconciled: N/A    Review of systems since admission:   Review of Systems   Constitutional: Negative.    HENT: Negative.     Eyes: Negative.    Respiratory: Negative.     Cardiovascular: Negative.    Gastrointestinal: Negative.    Genitourinary:  Negative.    Musculoskeletal: Negative.    Skin: Negative.    Neurological: Negative.    Endo/Heme/Allergies: Negative.    Psychiatric/Behavioral: Negative.       Available radiology data:   No results found.     Current laboratory data:     Recent Labs   Lab 03/06/21  0138 03/05/21  0503 03/04/21  2335 03/04/21  1720 03/04/21  1154 03/04/21  0428 03/03/21  1903   WBC  --  8.22  --   --   --  11.30* 8.93   RBC  --  3.85*  --   --   --  3.51* 4.06   Hgb  --  12.4  --   --   --  11.6 12.9   Hematocrit  --  37.1  --   --   --  33.1* 37.9   Platelets  --  175  --   --   --  173 172   Glucose 96 96 102* 105*  More results in Results Review 113* 126*   BUN 16.0 11.0 11.0 12.0  More results in Results Review 14.0 16.0   Creatinine 0.7 0.7 0.7 0.7  More results in Results Review 0.7 0.7   Calcium 8.3 8.7 9.0 8.7  More results in Results Review 8.5 9.2   Sodium 131* 131* 129* 132*  More results in Results Review 127* 130*   Potassium 4.5 4.0 3.9 4.1  More results in Results Review 4.3 3.9   Chloride 103  100 100 101  More results in Results Review 96* 97*   CO2 22 27 24 25   More results in Results Review 26 21*   More results in Results Review = values in this interval not displayed.       Physical examination:   Physical Exam  HENT:      Head: Normocephalic.      Comments: Abrasion to right side of scalp     Mouth/Throat:      Mouth: Mucous membranes are moist.      Pharynx: Oropharynx is clear.   Eyes:      Extraocular Movements: Extraocular movements intact.      Pupils: Pupils are equal, round, and reactive to light.   Cardiovascular:      Rate and Rhythm: Normal rate.      Pulses: Normal pulses.      Heart sounds: Normal heart sounds.   Pulmonary:      Effort: Pulmonary effort is normal.      Breath sounds: Normal breath sounds.      Comments: RA  Abdominal:      General: Abdomen is flat.      Palpations: Abdomen is soft.   Genitourinary:     Comments: No foley  Musculoskeletal:      Right shoulder: Normal.      Left shoulder: Normal.      Right upper arm: Normal.      Left upper arm: Normal.      Right elbow: Normal.      Left elbow: Normal.      Right forearm: Normal.      Left forearm: Normal.      Right wrist: Normal.      Left wrist: Normal.      Right hand: Normal.      Left hand: Normal.      Cervical back: Normal. No bony tenderness.      Thoracic back: Normal. No bony tenderness.  Lumbar back: Normal. Normal range of motion.      Right hip: Normal.      Left hip: Normal.      Right upper leg: Normal.      Left upper leg: Normal.      Right knee: Normal.      Left knee: Normal.      Right lower leg: Normal.      Left lower leg: Normal.      Right ankle: Normal.      Left ankle: Normal.      Right foot: Normal.      Left foot: Normal.   Skin:     Comments: R hip bruising   Neurological:      General: No focal deficit present.      Mental Status: She is oriented to person, place, and time.      Comments: GCS 15; HOH   Psychiatric:         Mood and Affect: Mood normal.       Vital signs:  Temp:  [97.7  F (36.5 C)-99 F (37.2 C)] 98.1 F (36.7 C)  Heart Rate:  [69-87] 80  Resp Rate:  [12-22] 16  BP: (101-157)/(53-73) 134/61    List of injuries by system:   Neurologic: L SDH    Problem list:     Active Hospital Problems    Diagnosis    Nontraumatic acute subdural hemorrhage        Verify problem list is appropriately updated: Yes    Consulting services:   Neurosurgery - [facility]    Confirm consulting services have been notified: N/A    Assessment and plan:   Neurological: L SDH  - GCS 15; multimodal pain control  Orthopedic: NAI  Pulmonary:   - Currently on RA; continue pulm toileting/IS/mobilization   Cardiac: Hx. Afib, HTN  - Continue home medications  - Monitor VS  Endocrine: Hypothyroidism  - Continue synthroid  - Continue BG   Gastrointestinal:  - Tolerating regular diet  - Continue BR   Renal:   - Voiding freely; monitor UOP   Hematologic/ID:   - Afebrile; no abx at this time   Candidate for Lovenox: No    Neuromuscular: PT/OT  Cervical spine clearance: N/A  Psychiatric: Supportive care  Wounds: LWC PRN      Signed by Pincus Badder, FNP 03/06/21 6:56 AM    Trauma surgeon attestation:

## 2021-03-06 NOTE — Discharge Summary -  Nursing (Signed)
CMU Discharge Note    Discharge to: Home     Family present during discharge: pt's daughter    Belonging: taken with pt ,no home meds    Discharge education to: pt and pt's daughter    Discharging equipments: N/A    Discharging medication: pt to continue home meds    IV: removed    Discharged Via: , wheelchair by: Clin tech, family transported home    Discharge Note: Patient is Alert and Oriented x 4, stable vitals, medically cleared for discharge. Discharge information given to pt, follow up with neurology for CT of head, pt and pt's daughter verbalized understanding.

## 2021-03-06 NOTE — Progress Notes (Signed)
Situation:   March 06, 2021   Patient information: [name], [name], 87 y.o., female,   Hospital day 3  Admission Diagnosis: Nontraumatic acute subdural hemorrhage [I62.01]  Fall, initial encounter [W19.XXXA]  LACE SCORE:  7      Dispo: Home with no needs  DME: N/A  Transport: per family       03/06/21 1302   Discharge Disposition   Patient preference/choice provided? Yes   Physical Discharge Disposition Home   Mode of Transportation Car   Patient/Family/POA notified of transfer plan Yes   Patient agreeable to discharge plan/expected d/c date? Yes   Family/POA agreeable to discharge plan/expected d/c date? Yes   Bedside nurse notified of transport plan? Yes   CM Interventions   Follow up appointment scheduled? No   Reason no follow up scheduled? Family to schedule   Notified MD? No   Referral made for home health RN visit? No, Other (comment)   Multidisciplinary rounds/family meeting before d/c? No   Medicare Checklist   Is this a Medicare patient? Yes   Patient received 1st IMM Letter? Yes   3 midnight inpatient qualifying stay (SNF only) No   If LOS 3 days or greater, did patient received 2nd IMM Letter? n/a             Pecolia Ades  MSN, RN  Case Management Department  [facility]

## 2021-03-06 NOTE — Plan of Care (Signed)
Problem: Moderate/High Fall Risk Score >5  Goal: Patient will remain free of falls  Outcome: Adequate for Discharge  Flowsheets (Taken 03/06/2021 1219)  VH High Risk (Greater than 13):   ALL REQUIRED LOW INTERVENTIONS   ALL REQUIRED MODERATE INTERVENTIONS   PATIENT IS TO BE SUPERVISED FOR ALL TOILETING ACTIVITIES   Keep door open for better visibility   Include family/significant other in multidisciplinary discussion regarding plan of care as appropriate   Use of floor mat     Problem: Safety  Goal: Patient will be free from injury during hospitalization  Outcome: Adequate for Discharge  Flowsheets (Taken 03/06/2021 1219)  Patient will be free from injury during hospitalization:   Assess patient's risk for falls and implement fall prevention plan of care per policy   Provide and maintain safe environment   Include patient/ family/ care giver in decisions related to safety   Hourly rounding  Goal: Patient will be free from infection during hospitalization  Outcome: Adequate for Discharge  Flowsheets (Taken 03/06/2021 1219)  Free from Infection during hospitalization:   Assess and monitor for signs and symptoms of infection   Monitor lab/diagnostic results   Monitor all insertion sites (i.e. indwelling lines, tubes, urinary catheters, and drains)     Problem: Pain  Goal: Pain at adequate level as identified by patient  Outcome: Adequate for Discharge  Flowsheets (Taken 03/06/2021 1219)  Pain at adequate level as identified by patient:   Identify patient comfort function goal   Assess for risk of opioid induced respiratory depression, including snoring/sleep apnea. Alert healthcare team of risk factors identified.   Reassess pain within 30-60 minutes of any procedure/intervention, per Pain Assessment, Intervention, Reassessment (AIR) Cycle   Offer non-pharmacological pain management interventions   Include patient/patient care companion in decisions related to pain management as needed     Problem: Side Effects from Pain  Analgesia  Goal: Patient will experience minimal side effects of analgesic therapy  Outcome: Adequate for Discharge  Flowsheets (Taken 03/06/2021 1219)  Patient will experience minimal side effects of analgesic therapy:   Monitor/assess patient's respiratory status (RR depth, effort, breath sounds)   Assess for changes in cognitive function   Prevent/manage side effects per LIP orders (i.e. nausea, vomiting, pruritus, constipation, urinary retention, etc.)     Problem: Discharge Barriers  Goal: Patient will be discharged home or other facility with appropriate resources  Outcome: Adequate for Discharge  Flowsheets (Taken 03/06/2021 1219)  Discharge to home or other facility with appropriate resources:   Provide appropriate patient education   Provide information on available health resources     Problem: Psychosocial and Spiritual Needs  Goal: Demonstrates ability to cope with hospitalization/illness  Outcome: Adequate for Discharge  Flowsheets (Taken 03/06/2021 1219)  Demonstrates ability to cope with hospitalizations/illness:   Encourage verbalization of feelings/concerns/expectations   Provide quiet environment   Include patient/ patient care companion in decisions     Problem: Neurological Deficit  Goal: Neurological status is stable or improving  Outcome: Adequate for Discharge  Flowsheets (Taken 03/06/2021 1219)  Neurological status is stable or improving: Perform CAM Assessment     Problem: Potential for Aspiration  Goal: Risk of aspiration will be minimized  Outcome: Adequate for Discharge  Flowsheets (Taken 03/06/2021 1219)  Risk of aspiration will be minimized:   Monitor/assess for signs of aspiration (tachypnea, cough, wheezing, clearing throat, hoarseness after eating, decrease in SaO2)   Head of bed up 90 degrees to eat if unable to be out of bed  Problem: Impaired Mobility  Goal: Mobility/Activity is maintained at optimal level for patient  Outcome: Adequate for Discharge  Flowsheets (Taken 03/06/2021  1219)  Mobility/activity is maintained at optimal level for patient:   Increase mobility as tolerated/progressive mobility   Encourage independent activity per ability     Problem: Communication Impairment  Goal: Will be able to express needs and understand communication  Outcome: Adequate for Discharge  Flowsheets (Taken 03/06/2021 1219)  Able to express needs and understand communication:   Provide alternative method of communication if needed   Consult/collaborate with Speech Language Pathology (SLP)     Problem: Nutrition  Goal: Nutritional intake is adequate  Outcome: Adequate for Discharge  Flowsheets (Taken 03/06/2021 1219)  Nutritional intake is adequate:   Assist patient with meals/food selection   Allow adequate time for meals     Problem: Compromised Tissue integrity  Goal: Damaged tissue is healing and protected  Outcome: Adequate for Discharge  Flowsheets (Taken 03/06/2021 1219)  Damaged tissue is healing and protected:   Monitor/assess Braden scale every shift   Relieve pressure to bony prominences for patients at moderate and high risk   Keep intact skin clean and dry   Use bath wipes, not soap and water, for daily bathing   Monitor external devices/tubes for correct placement to prevent pressure, friction and shearing  Goal: Nutritional status is improving  Outcome: Adequate for Discharge  Flowsheets (Taken 03/06/2021 1219)  Nutritional status is improving:   Allow adequate time for meals   Include patient/patient care companion in decisions related to nutrition

## 2021-03-06 NOTE — Progress Notes (Signed)
Medicine Shift Note    Braden Scale Score: 20 (03/06/21 1212)  Skin Integrity: Bruising, Scars  Bruising Skin Location: scattered  Patient Lines/Drains/Airways Status       Active Lines, Drains and Airways       Name Placement date Placement time Site Days    Peripheral IV 03/04/21 20 G Standard Right Forearm 03/04/21  0135  Forearm  2    Peripheral IV 03/04/21 20 G Standard Left Forearm 03/04/21  0135  Forearm  2                      Last BM: 8/15    Pending Orders: discharge    Discharge Plan: Home to self care    POC: pt and pt's daughter    Skin: scars on knees and chest, scattered bruising, hematoma on head    Tele: no    Activity: standby with walker    PT/OT: 8/17 recommends home with supervision    Interpreter Needs: N/A    Intake & Output for the shift: I/O this shift:  In: 300 [P.O.:300]  Out: -   2 unmeasured urine occurrences    Shift Note: Patient is Alert and Oriented x 4, stable vitals, denies pain. All scheduled medication given, pt tolerated well. Bilateral radial and pedal pulses are palpable, capillary refill <3 on all extremities. Falls precautions in place, call bell and bedside table within reach.

## 2021-03-07 ENCOUNTER — Telehealth (INDEPENDENT_AMBULATORY_CARE_PROVIDER_SITE_OTHER): Payer: Self-pay | Admitting: Cardiology

## 2021-03-07 NOTE — Telephone Encounter (Signed)
This patient's daughter [name]) is transferring her care to Dr. Franchot Erichsen and she would like [place] to request the records from her previous cardiologist: Dr. Lady Saucier [phone]).      Phone: [phone]    [facility]  [facility]

## 2021-03-09 ENCOUNTER — Emergency Department: Payer: Medicare Other

## 2021-03-09 ENCOUNTER — Inpatient Hospital Stay
Admission: EM | Admit: 2021-03-09 | Discharge: 2021-03-12 | DRG: 100 | Disposition: A | Discharge status: Home Health | Payer: Medicare Other | Attending: Internal Medicine | Admitting: Internal Medicine

## 2021-03-09 DIAGNOSIS — G936 Cerebral edema: Secondary | ICD-10-CM | POA: Diagnosis present

## 2021-03-09 DIAGNOSIS — S065X9S Traumatic subdural hemorrhage with loss of consciousness of unspecified duration, sequela: Secondary | ICD-10-CM

## 2021-03-09 DIAGNOSIS — I10 Essential (primary) hypertension: Secondary | ICD-10-CM | POA: Diagnosis present

## 2021-03-09 DIAGNOSIS — Z87440 Personal history of urinary (tract) infections: Secondary | ICD-10-CM

## 2021-03-09 DIAGNOSIS — Z20822 Contact with and (suspected) exposure to covid-19: Secondary | ICD-10-CM | POA: Diagnosis present

## 2021-03-09 DIAGNOSIS — Z79899 Other long term (current) drug therapy: Secondary | ICD-10-CM

## 2021-03-09 DIAGNOSIS — Z79890 Hormone replacement therapy (postmenopausal): Secondary | ICD-10-CM

## 2021-03-09 DIAGNOSIS — Z952 Presence of prosthetic heart valve: Secondary | ICD-10-CM

## 2021-03-09 DIAGNOSIS — S065X9D Traumatic subdural hemorrhage with loss of consciousness of unspecified duration, subsequent encounter: Secondary | ICD-10-CM

## 2021-03-09 DIAGNOSIS — R569 Unspecified convulsions: Principal | ICD-10-CM | POA: Diagnosis present

## 2021-03-09 DIAGNOSIS — E785 Hyperlipidemia, unspecified: Secondary | ICD-10-CM | POA: Diagnosis present

## 2021-03-09 DIAGNOSIS — R4701 Aphasia: Secondary | ICD-10-CM | POA: Diagnosis present

## 2021-03-09 DIAGNOSIS — Z96653 Presence of artificial knee joint, bilateral: Secondary | ICD-10-CM | POA: Diagnosis present

## 2021-03-09 DIAGNOSIS — S06359S Traumatic hemorrhage of left cerebrum with loss of consciousness of unspecified duration, sequela: Secondary | ICD-10-CM

## 2021-03-09 DIAGNOSIS — I619 Nontraumatic intracerebral hemorrhage, unspecified: Secondary | ICD-10-CM | POA: Diagnosis present

## 2021-03-09 DIAGNOSIS — Z90710 Acquired absence of both cervix and uterus: Secondary | ICD-10-CM

## 2021-03-09 DIAGNOSIS — W19XXXS Unspecified fall, sequela: Secondary | ICD-10-CM | POA: Diagnosis present

## 2021-03-09 DIAGNOSIS — S06359D Traumatic hemorrhage of left cerebrum with loss of consciousness of unspecified duration, subsequent encounter: Secondary | ICD-10-CM

## 2021-03-09 DIAGNOSIS — I480 Paroxysmal atrial fibrillation: Secondary | ICD-10-CM | POA: Diagnosis present

## 2021-03-09 DIAGNOSIS — N390 Urinary tract infection, site not specified: Secondary | ICD-10-CM | POA: Diagnosis present

## 2021-03-09 DIAGNOSIS — E871 Hypo-osmolality and hyponatremia: Secondary | ICD-10-CM | POA: Diagnosis present

## 2021-03-09 DIAGNOSIS — E222 Syndrome of inappropriate secretion of antidiuretic hormone: Secondary | ICD-10-CM | POA: Diagnosis present

## 2021-03-09 DIAGNOSIS — E039 Hypothyroidism, unspecified: Secondary | ICD-10-CM | POA: Diagnosis present

## 2021-03-09 DIAGNOSIS — S065XAA Traumatic subdural hemorrhage with loss of consciousness status unknown, initial encounter: Secondary | ICD-10-CM | POA: Diagnosis present

## 2021-03-09 HISTORY — DX: Hypothyroidism, unspecified: E03.9

## 2021-03-09 LAB — CBC AND DIFFERENTIAL
Absolute NRBC: 0 10*3/uL (ref 0.00–0.00)
Basophils Absolute Automated: 0.03 10*3/uL (ref 0.00–0.08)
Basophils Automated: 0.3 %
Eosinophils Absolute Automated: 0.1 10*3/uL (ref 0.00–0.44)
Eosinophils Automated: 1 %
Hematocrit: 35.7 % (ref 34.7–43.7)
Hgb: 12.5 g/dL (ref 11.4–14.8)
Immature Granulocytes Absolute: 0.03 10*3/uL (ref 0.00–0.07)
Immature Granulocytes: 0.3 %
Lymphocytes Absolute Automated: 1.44 10*3/uL (ref 0.42–3.22)
Lymphocytes Automated: 14.6 %
MCH: 32.6 pg (ref 25.1–33.5)
MCHC: 35 g/dL (ref 31.5–35.8)
MCV: 93 fL (ref 78.0–96.0)
MPV: 9.5 fL (ref 8.9–12.5)
Monocytes Absolute Automated: 0.79 10*3/uL (ref 0.21–0.85)
Monocytes: 8 %
Neutrophils Absolute: 7.49 10*3/uL — ABNORMAL HIGH (ref 1.10–6.33)
Neutrophils: 75.8 %
Nucleated RBC: 0 /100 WBC (ref 0.0–0.0)
Platelets: 214 10*3/uL (ref 142–346)
RBC: 3.84 10*6/uL — ABNORMAL LOW (ref 3.90–5.10)
RDW: 13 % (ref 11–15)
WBC: 9.88 10*3/uL — ABNORMAL HIGH (ref 3.10–9.50)

## 2021-03-09 LAB — URINALYSIS REFLEX TO MICROSCOPIC EXAM - REFLEX TO CULTURE
Bilirubin, UA: NEGATIVE
Blood, UA: NEGATIVE
Glucose, UA: NEGATIVE
Ketones UA: NEGATIVE
Nitrite, UA: NEGATIVE
Protein, UR: NEGATIVE
Specific Gravity UA: 1.009 (ref 1.001–1.035)
Urine pH: 6.5 (ref 5.0–8.0)
Urobilinogen, UA: NORMAL mg/dL (ref 0.2–2.0)

## 2021-03-09 LAB — BASIC METABOLIC PANEL
Anion Gap: 8 (ref 5.0–15.0)
BUN: 13 mg/dL (ref 7.0–19.0)
CO2: 25 mEq/L (ref 22–29)
Calcium: 9.4 mg/dL (ref 7.9–10.2)
Chloride: 94 mEq/L — ABNORMAL LOW (ref 100–111)
Creatinine: 0.8 mg/dL (ref 0.6–1.0)
Glucose: 106 mg/dL — ABNORMAL HIGH (ref 70–100)
Potassium: 3.8 mEq/L (ref 3.5–5.1)
Sodium: 127 mEq/L — ABNORMAL LOW (ref 136–145)

## 2021-03-09 LAB — GFR: EGFR: >60

## 2021-03-09 LAB — TROPONIN I: Troponin I: 0.02 ng/mL (ref 0.00–0.05)

## 2021-03-09 LAB — HEPATIC FUNCTION PANEL
ALT: 21 U/L (ref 0–55)
AST (SGOT): 29 U/L (ref 5–34)
Albumin/Globulin Ratio: 1.3 (ref 0.9–2.2)
Albumin: 4.3 g/dL (ref 3.5–5.0)
Alkaline Phosphatase: 40 U/L (ref 37–117)
Bilirubin Direct: 0.4 mg/dL (ref 0.0–0.5)
Bilirubin Indirect: 0.5 mg/dL (ref 0.2–1.0)
Bilirubin, Total: 0.9 mg/dL (ref 0.2–1.2)
Globulin: 3.2 g/dL (ref 2.0–3.6)
Protein, Total: 7.5 g/dL (ref 6.0–8.3)

## 2021-03-09 LAB — LIPASE: Lipase: 38 U/L (ref 8–78)

## 2021-03-09 LAB — COVID-19 (SARS-COV-2) & INFLUENZA  A/B, NAA (ROCHE LIAT)
Influenza A: NOT DETECTED
Influenza B: NOT DETECTED
SARS CoV 2 Overall Result: NOT DETECTED

## 2021-03-09 LAB — GLUCOSE WHOLE BLOOD - POCT: Whole Blood Glucose POCT: 104 mg/dL — ABNORMAL HIGH (ref 70–100)

## 2021-03-09 LAB — LACTIC ACID, PLASMA: Lactic Acid: 1 mmol/L (ref 0.2–2.0)

## 2021-03-09 NOTE — ED Notes (Signed)
[facility] HOSPITAL EMERGENCY DEPT  ED NURSING NOTE FOR THE RECEIVING INPATIENT NURSE   ED NURSE [address]   [place] [address]   ED CHARGE RN [phone]   ADMISSION INFORMATION   [name] is a 87 y.o. female admitted with an ED diagnosis of:    1. Aphasia         Isolation: None   Allergies: Codeine   Holding Orders confirmed? No   Belongings Documented? No   Home medications sent to pharmacy confirmed? No   NURSING CARE   Patient Comes From:   Mental Status: Home/Family Care  alert, confused and having difficulty finding words   ADL: Needs assistance with ADLs   Ambulation: ambulates with: walker and recently has been using a wheelchair   Pertinent Information  and Safety Concerns: HIGH fall risk. Most recent fall was Friday and pt has a little bump to the right scalp. Pt AOx3- disoriented to situation.      CT / NIH   CT Head ordered on this patient?  Yes   NIH/Dysphagia assessment done prior to admission? No   VITAL SIGNS (at the time of this note)      Vitals:    03/09/21 2230   BP: 145/67   Pulse: 80   Resp: 15   Temp:    SpO2: 99%

## 2021-03-09 NOTE — Consults (Signed)
TRAUMA CONSULT     Date Time: 03/09/21 11:16 PM  Patient Name: [name]  Attending Physician: Buel Ream, MD  Primary Care Physician: Patsy Lager, MD    Date of Admission:   03/09/2021  8:28 PM    Trauma Level:   Consult    Assessment/Plan:   The patient has the following active problems:  Active Hospital Problems    Diagnosis    Aphasia          87 yo female w/ pmhx of pafib not on AC, HTN, HLD who was recently admitted for fall resulting in IPH and SDH who presents again s/p fall 2 days ago and sudden neurological change significant for aphasia most prominently noted today with stable/improving subacute ICH and concern for new stroke symptoms.     - No acute traumatic service needs at this time   - Recommend admission to medicine for stroke vs seizure work up    Massive transfusion protocol:  No      Consulting Services:   NSGY notified by ED     Patient Complaint:   [name] is a 87 y.o. female who presents to the hospital after Fall: Yes.  Patient fell 2 days ago backwards while folding away clothes, at the time did not have any LOC and was acting normally. Starting yesterday evening patient seemed a bit off to family members and today there was a significant change in neuro status with receptive and expressive aphasia. Per daughter and grand-daughter, the patient has been noted to have episodes of blankly staring off to the distance and coming back to normal awareness since yesterday. No episodes of syncope.     Allergies:     Allergies   Allergen Reactions    Codeine Nausea And Vomiting       Medication:   (Not in a hospital admission)      Past Medical History:     Past Medical History:   Diagnosis Date    Bleeding in head following injury with loss of consciousness     a year ago    Disorder of thyroid     Hyperlipidemia     Hypertension     Irregular heartbeat        Past Surgical History:   History reviewed. No pertinent surgical history.    Family History:   History reviewed.  No pertinent family history.    Social History:     Social History     Socioeconomic History    Marital status: Unknown     Spouse name: Not on file    Number of children: Not on file    Years of education: Not on file    Highest education level: Not on file   Occupational History    Not on file   Tobacco Use    Smoking status: Never    Smokeless tobacco: Never   Vaping Use    Vaping Use: Never used   Substance and Sexual Activity    Alcohol use: Yes     Comment: sometimes    Drug use: Never    Sexual activity: Not on file   Other Topics Concern    Not on file   Social History Narrative    Not on file     Social Determinants of Health     Financial Resource Strain: Low Risk     Difficulty of Paying Living Expenses: Not hard at all   Food Insecurity: No Food Insecurity  Worried About Programme researcher, broadcasting/film/video in the Last Year: Never true    [employer] of Food in the Last Year: Never true   Transportation Needs: No Transportation Needs    Lack of Transportation (Medical): No    Lack of Transportation (Non-Medical): No   Physical Activity: Not on file   Stress: No Stress Concern Present    Feeling of Stress : Not at all   Social Connections: Moderately Integrated    Frequency of Communication with Friends and Family: More than three times a week    Frequency of Social Gatherings with Friends and Family: More than three times a week    Attends Religious Services: More than 4 times per year    Active Member of [employer] or Organizations: Yes    Attends Banker Meetings: More than 4 times per year    Marital Status: Widowed   Catering manager Violence: Not At Risk    Fear of Current or Ex-Partner: No    Emotionally Abused: No    Physically Abused: No    Sexually Abused: No   Housing Stability: Unknown    Unable to Pay for Housing in the Last Year: No    Number of Places Lived in the Last Year: Not on file    Unstable Housing in the Last Year: No       Vaccination:   Tetanus up to date: Yes    Review of Systems:   Review  of Systems   Unable to perform ROS: Mental status change     Physical Exam:   Physical Exam  Vitals reviewed.   Constitutional:       General: She is not in acute distress.     Appearance: Normal appearance. She is not ill-appearing or toxic-appearing.   HENT:      Head: Normocephalic.      Comments: Small contusion in posterior head     Right Ear: External ear normal.      Left Ear: External ear normal.      Nose: Nose normal.      Mouth/Throat:      Mouth: Mucous membranes are moist.      Pharynx: Oropharynx is clear.   Eyes:      Extraocular Movements: Extraocular movements intact.      Conjunctiva/sclera: Conjunctivae normal.      Pupils: Pupils are equal, round, and reactive to light.   Cardiovascular:      Rate and Rhythm: Normal rate and regular rhythm.      Heart sounds: Normal heart sounds.   Pulmonary:      Effort: Pulmonary effort is normal. No respiratory distress.      Breath sounds: Normal breath sounds. No wheezing.   Abdominal:      General: There is no distension.      Palpations: Abdomen is soft.      Tenderness: There is no abdominal tenderness. There is no guarding.   Genitourinary:     Comments: Pelvis stable  Musculoskeletal:         General: No swelling or deformity. Normal range of motion.      Cervical back: Normal range of motion and neck supple. No rigidity or tenderness.      Right lower leg: No edema.      Left lower leg: No edema.   Skin:     General: Skin is warm and dry.   Neurological:      Mental Status: She is alert and  oriented to person, place, and time.      Comments: A&Ox3 intermittently with circuitous responses. Word salad which worsens with prolonged conversation       Vitals:    03/09/21 2230   BP: 145/67   Pulse: 80   Resp: 15   Temp:    SpO2: 99%       Labs:     Recent Labs   Lab 03/09/21  2033 03/06/21  0138 03/05/21  0503 03/04/21  2335 03/04/21  1154 03/04/21  0428 03/03/21  1903   WBC 9.88*  --  8.22  --   --  11.30* 8.93   RBC 3.84*  --  3.85*  --   --  3.51* 4.06    Hgb 12.5  --  12.4  --   --  11.6 12.9   Hematocrit 35.7  --  37.1  --   --  33.1* 37.9   Platelets 214  --  175  --   --  173 172   Glucose 106* 96 96 102*  More results in Results Review 113* 126*   BUN 13.0 16.0 11.0 11.0  More results in Results Review 14.0 16.0   Creatinine 0.8 0.7 0.7 0.7  More results in Results Review 0.7 0.7   Calcium 9.4 8.3 8.7 9.0  More results in Results Review 8.5 9.2   Sodium 127* 131* 131* 129*  More results in Results Review 127* 130*   Potassium 3.8 4.5 4.0 3.9  More results in Results Review 4.3 3.9   Chloride 94* 103 100 100  More results in Results Review 96* 97*   CO2 25 22 27 24   More results in Results Review 26 21*   More results in Results Review = values in this interval not displayed.       Rads:   Radiological Procedure reviewed.      CT HEAD WO CONTRAST      The following images were received from an outside facility and reviewed:None    Attending Attestation:

## 2021-03-09 NOTE — ED Provider Notes (Signed)
[facility] EMERGENCY DEPARTMENT  ATTENDING PHYSICIAN HISTORY AND PHYSICAL EXAM     Patient Name: [name], [name]  Encounter Date:  03/09/2021  Attending Physician: Reginia Forts, MD  Room:  N 37/N 37  Patient DOB:  02/13/1928  Age: 87 y.o. female  MRN:  [id number]  PCP: Patsy Lager, MD         Diagnosis/Disposition:     Final Impression  Final diagnoses:   Aphasia     Disposition  ED Disposition       ED Disposition   Observation    Condition   --    Date/Time   Sun Mar 09, 2021 11:21 PM    Comment   Admitting Physician: Emeline Darling [[id number]]   Service:: Neurology [[id number]]   Estimated Length of Stay: < 2 midnights   Tentative Discharge Plan?: Home or Self Care [1]   Does patient need telemetry?: Yes   Telemetry type (separate Telemetry order is also required):: Medical telemetry                        MDM:      MDM:     87 yo F presenting with aphasia. In the setting of recent trauma  differential diagnosis including but not limited to ICH, CVA/TIA, Seizure,  metabolic, infectious, cardiogenic etiology.  Head CT reviewed with radiology and neurosurgery. No acute bleed or surgical intervention needed.   Blood work unremarkable to explain patient's symptoms.  Urinalysis without signs of infection.   Patient is not tPA/neuro IR candidate at this time.  Plan to admit for further evaluation management.    ED Course as of 03/16/21 1306   Sun Mar 09, 2021   2202 Discussed results of CT imaging with Neurosurgery consultant, improved from prior no acute neurosurgical intervention. [KR]      ED Course User Index  [KR] Milford Cage T     Consulted trauma, evaluated patient, no indication for trauma admission. Recommend CNS for CVA / Seizure work up.     On admission: D/W patient ED workup. Also discussed red flags and concerning symptoms for which patient will be monitored and managed further during this hospitalization.  Answered questions.  Agreed and understood plan.             History of Presenting Illness:         Chief complaint: Aphasia    HPI  [name] is a 87 y.o. female w/PMH bleeding in head, HTN, HLD, thyroid disorder presents for aphasia and altered mental status a/w slurred speech and difficulty identifying certain objects. Per family, patient was seen last week in the ED on 8/15 for a fall and discharged until 8/18 and was found to have brain bleeding. Patient then had another fall on 8/19 that was unwitnessed. Family seems to think patient tripped while putting her clothes away and bumped her head. Patient was then acting normal on 8/19 and seemed to be at baseline.     Today at noon family began to notice sudden onset of symptoms a/w mild right facial droop. Per family, patient was unable to read a simple sentence earlier today. Patient currently denies any pain or discomfort. Patient is not on any blood thinners.         Review of Systems:  Physical Exam:     Review of Systems   Constitutional:  Negative for fever.   Eyes:  Negative for visual disturbance.   Respiratory:  Negative  for shortness of breath.    Cardiovascular:  Negative for chest pain.   Gastrointestinal:  Negative for abdominal pain and vomiting.   Musculoskeletal:  Negative for back pain.   Neurological:  Positive for facial asymmetry (Mild right sided facial droop.). Negative for headaches.   Psychiatric/Behavioral:  Positive for confusion.      All other systems reviewed and negative except what is specifically documented above.     Pulse 83  BP 181/88  Resp 18  SpO2 97 %  Temp 98.3 F (36.8 C)     Physical Exam  Vitals and nursing note reviewed.   Constitutional:       General: She is not in acute distress.     Appearance: She is well-developed. She is not toxic-appearing or diaphoretic.      Comments: Expressive and receptive aphasia and word finding.    HENT:      Head: Normocephalic and atraumatic.      Comments: Right sided facial droop that corrects when smiling.     Mouth/Throat:      Mouth: Mucous membranes are  moist.      Pharynx: Oropharynx is clear.      Comments: Tolerating secretions.  No hoarseness.  Eyes:      Extraocular Movements: Extraocular movements intact.      Pupils: Pupils are equal, round, and reactive to light.   Cardiovascular:      Rate and Rhythm: Normal rate and regular rhythm.      Pulses: Normal pulses.      Heart sounds: Murmur heard.   Systolic murmur is present.   Pulmonary:      Effort: Pulmonary effort is normal. No respiratory distress or retractions.      Breath sounds: Normal breath sounds.   Abdominal:      General: There is no distension.      Palpations: Abdomen is soft.      Tenderness: There is no abdominal tenderness.   Musculoskeletal:         General: Normal range of motion.      Cervical back: Full passive range of motion without pain, normal range of motion and neck supple.   Skin:     General: Skin is warm and dry.      Capillary Refill: Capillary refill takes less than 2 seconds.   Neurological:      Comments: Patient is alert and oriented to person, place, and time. No cranial nerve deficit. GCS score is 15. 5/5 muscle strength in bilateral upper and lower extremities. No pronator drift. Normal finger to nose. No sensory deficits. Gait deferred.               Diagnostic Results:     Laboratory Studies:    All lab values have been personally reviewed by me    Results       Procedure Component Value Units Date/Time    COVID-19 (SARS-CoV-2) and Influenza A/B, NAA ([facility])- Admission [[id number]] Collected: 03/09/21 2033    Specimen: Culturette from Nasopharyngeal Updated: 03/09/21 2047     Purpose of COVID testing Diagnostic -[facility]I     SARS-CoV-2 Specimen Source Nasal Swab    Narrative:      o Collect and clearly label specimen type:  o PREFERRED-Upper respiratory specimen: One Nasal Swab in  Transport Media.  o Hand deliver to laboratory ASAP  Diagnostic -[facility]I    CBC and differential [[id number]] Collected: 03/09/21 2033    Specimen: Blood Updated: 03/09/21 2034  Basic Metabolic  Panel [[id number]] Collected: 03/09/21 2033    Specimen: Blood Updated: 03/09/21 2034    Lipase [[id number]] Collected: 03/09/21 2033    Specimen: Blood Updated: 03/09/21 2034    Hepatic function panel (LFT) [[id number]] Collected: 03/09/21 2033    Specimen: Blood Updated: 03/09/21 2034    Lactic Acid [[id number]] Collected: 03/09/21 2033    Specimen: Blood Updated: 03/09/21 2034    Narrative:      No tourniquet;on ice    Troponin I [[id number]] Collected: 03/09/21 2033    Specimen: Blood Updated: 03/09/21 2034    Glucose Whole Blood - POCT [[id number]]  (Abnormal) Collected: 03/09/21 2021     Updated: 03/09/21 2032     Whole Blood Glucose POCT 104 mg/dL             Radiology Studies:    All images have been personally viewed by me    CT Head WO Contrast   Final Result    Previously visualized thin left cerebral convexity subdural   hematoma not definitely seen on this examination. The study is otherwise   without significant interval change as above.          Leandro Reasoner, MD    03/09/2021 9:35 PM              Interpretations, Clinical Decision Tools and Critical Care:     O2 Sat-           saturation: 97 %; Oxygen use: room air; Interpretation: Normal    Monitor -         interpreted by me: normal sinus at 82.       EKG: Interpreted by Emergency Physician.   Time Interpreted: 2042   Rate: 85   Rhythm: Normal Sinus Rhythm with PAC   Interpretation: Incomplete RBBB, non-specific ST segment morphology, borderline ST depression in V    The patient's past medical records, including those in Care Everywhere when necessary, were reviewed by me.    This patient was seen and evaluated during the SARS-CoV-2 pandemic.       Procedures:   Procedures        Orders Placed During This Visit:     Encounter Orders:  Orders Placed This Encounter   Procedures    COVID-19 (SARS-CoV-2) and Influenza A/B, NAA ([facility])- Admission    CT Head WO Contrast    CBC and differential    Basic Metabolic Panel    Lipase    Hepatic function panel  (LFT)    Lactic Acid    Troponin I    Urinalysis Reflex to Microscopic Exam- Reflex to Culture    GFR    Glucose Whole Blood - POCT    ECG 12 lead       Encounter Medications:  Medications - No data to display        Allergies & Medications:     Allergies:  Sheis allergic to codeine.    Home Medications               acetaminophen (TYLENOL) 325 MG tablet     Take 2 tablets (650 mg total) by mouth every 6 (six) hours     amLODIPine (NORVASC) 5 MG tablet     TAKE 1 TABLET BY MOUTH IN THE MORNING HALF TABLET THE EVENING     diclofenac Sodium (VOLTAREN) 1 % Gel topical gel     APPLY 1 GRAM EVERY 8 HOURS     digoxin (LANOXIN) 0.125 MG  tablet     TAKE 1 TABLET BY MOUTH ONCE A DAY ON TUESDAY, THURSDAY, SATURDAY, AND SUNDAY.     digoxin (LANOXIN) 0.25 MG tablet     TAKE 1 TABLET BY MOUTH ON MONDAY/WEDNESDAY AND FRIDAY 90 DAY(S)     Fungoid Tincture 2 % Solution     APPLY TWICE A DAY TO TOE NAIL     levothyroxine (SYNTHROID) 88 MCG tablet     Take 88 mcg by mouth every morning     lisinopril (ZESTRIL) 10 MG tablet     TAKE 1 TABLET ORALLY ONCE A DAY EVERY MORNING FOR 30 DAY(S)     meloxicam (MOBIC) 7.5 MG tablet     Take 7.5 mg by mouth daily     raloxifene (EVISTA) 60 MG tablet     Take 60 mg by mouth daily     rosuvastatin (CRESTOR) 20 MG tablet     Take 20 mg by mouth nightly     urea (CARMOL) 40 % cream     APPLY 1 GRAM ONCE A DAY TO NAILS AND CALLUSES                 Past History:     Medical:   Past Medical History:   Diagnosis Date    Bleeding in head following injury with loss of consciousness     a year ago    Disorder of thyroid     Hyperlipidemia     Hypertension     Irregular heartbeat        Surgical: She has no past surgical history on file.    Family: No family history on file.    Social: She reports that she has never smoked. She has never used smokeless tobacco. She reports that she does not currently use alcohol. She reports that she does not use drugs.        ATTESTATIONS     Reginia Forts,  MD    Scribe Attestation:    I was acting as a scribe for Marko Stai, MD on [facility]   Treatment Team: Scribe: Milford Cage     I am the first provider for this patient and I personally performed the services documented. Treatment Team: Scribe: Milford Cage is scribing for me on  [facility].This note and the patient instructions accurately reflect work and decisions made by me.  Marko Stai, MD    Documentation Notes:    Parts of this note were generated by the Epic EMR system/ Dragon speech recognition and may contain inherent errors or omissions not intended by the user. Grammatical errors, random word insertions, deletions, pronoun errors and incomplete sentences are occasional consequences of this technology due to software limitations. Not all errors are caught or corrected.    My documentation is often completed after the patient is no longer under my clinical care. In some cases, the Epic EMR may pull updated results into the above documentation which may not reflect all results or information that was available to me at the time of my medical decision making.     If there are questions or concerns about the content of this note or information contained within the body of this dictation they should be addressed directly with the author for clarification.                    Marko Stai, MD  03/16/21 1315

## 2021-03-09 NOTE — Plan of Care (Signed)
Neurosurgery    Called about pt known to service in ER with new expressive aphasia. CT Head reviewed and previously seen L SDH is stable with some evolution/improvement. No acute NSGY indicated based on imaging. Did recommend having pt worked up for seizures/stroke to help identify source of aphasia.    Silvestre Mesi, FNP   Neurosurgery  10:03 PM 03/09/21

## 2021-03-09 NOTE — H&P (Addendum)
[facility] HOSPITALIST ADMISSION HISTORY AND PHYSICAL EXAM    Date Time: 03/10/21 1:15 AM  Patient Name: [name]  Attending Physician: Emeline Darling, MD  Primary Care Physician: Patsy Lager, MD    CC: expressive and receptive aphasia      History of Presenting Illness:   [name] is a 87 y.o. female visiting from [place] hx HTN, HLD, hypothyroid, irregular heartbeat, sternotomy for aortic valve replacement >10 years ago then repeat aortic valve replacement 5-6 years ago (procedure sounds like TAVR), intracerebral hemorrhage after fall 2021 [facility] [place], [place] - no residual deficit per daughter), trauma admit 8/15-8/18 for left temporal and smaller rigth temporal hemorrhagic contusion + left greater than right SDH (only right SDH is along tentorial leaflet) + IVH who presents to the hospital with expressive and receptive aphasia. She was doing well after the trauma admit until ~10:30 today when at brunch it was difficult to get her attention even when calling out her name. Later she was found sitting with her head down in her hands and again it was difficult to get her attention. When she spoke there was word finding difficulty and some word salad. She appeared to have expressive and receptive aphasia for me. She denies headache. These symptoms are sudden onset, moderate intensity, without alleviating factors.    Her daughter also noted one episode of right hand shaking last admission making it difficult for her to use a spoon. Her daughter has noted infrequent right facial twitching which started prior to the trauma admission.     PMD: Dr. Arletta Bale [phone] [facility] [place], [place])  Cardiologist: Dr. Lady Saucier [phone] [facility] [place], [place])    Past Medical History:     Past Medical History:   Diagnosis Date    Bleeding in head following injury with loss of consciousness     a year ago    Hyperlipidemia     Hypertension     Hypothyroidism     Irregular heartbeat         Past Surgical History:     Past Surgical History:   Procedure Laterality Date    AORTIC VALVE REPLACEMENT      first AVR required sternotomy, >10 years ago per patient    BACK SURGERY      HYSTERECTOMY      REPLACEMENT TOTAL KNEE Bilateral     left total, right "half knee" replacement    TRANSCATHETER AORTIC VALVE REPLACEMENT  2016    5-6 years ago per patient    WRIST SURGERY Left 2017       Family History:     Family History   Problem Relation Age of Onset    Stroke Neg Hx     Intracerebral hemorrhage Neg Hx        Social History:     Social History     Socioeconomic History    Marital status: Unknown     Spouse name: Not on file    Number of children: Not on file    Years of education: Not on file    Highest education level: Not on file   Occupational History    Not on file   Tobacco Use    Smoking status: Never    Smokeless tobacco: Never   Vaping Use    Vaping Use: Never used   Substance and Sexual Activity    Alcohol use: Yes     Alcohol/week: 3.0 standard drinks     Types:  3 Glasses of wine per week     Comment: 1-2 a week    Drug use: Never    Sexual activity: Not on file   Other Topics Concern    Not on file   Social History Narrative    Not on file     Social Determinants of Health     Financial Resource Strain: Low Risk     Difficulty of Paying Living Expenses: Not hard at all   Food Insecurity: No Food Insecurity    Worried About Programme researcher, broadcasting/film/video in the Last Year: Never true    Ran Out of Food in the Last Year: Never true   Transportation Needs: No Transportation Needs    Lack of Transportation (Medical): No    Lack of Transportation (Non-Medical): No   Physical Activity: Not on file   Stress: No Stress Concern Present    Feeling of Stress : Not at all   Social Connections: Moderately Integrated    Frequency of Communication with Friends and Family: More than three times a week    Frequency of Social Gatherings with Friends and Family: More than three times a week    Attends Religious Services:  More than 4 times per year    Active Member of [employer] or Organizations: Yes    Attends Banker Meetings: More than 4 times per year    Marital Status: Widowed   Catering manager Violence: Not At Risk    Fear of Current or Ex-Partner: No    Emotionally Abused: No    Physically Abused: No    Sexually Abused: No   Housing Stability: Unknown    Unable to Pay for Housing in the Last Year: No    Number of Places Lived in the Last Year: Not on file    Unstable Housing in the Last Year: No       Allergies:     Allergies   Allergen Reactions    Codeine Nausea And Vomiting       Medications:     Prior to Admission medications    Medication Sig Start Date End Date Taking? Authorizing Provider   acetaminophen (TYLENOL) 325 MG tablet Take 2 tablets (650 mg total) by mouth every 6 (six) hours 03/06/21   Pincus Badder, FNP   amLODIPine (NORVASC) 5 MG tablet TAKE 1 TABLET BY MOUTH IN THE MORNING HALF TABLET THE EVENING 09/25/20   [provider]   diclofenac Sodium (VOLTAREN) 1 % Gel topical gel APPLY 1 GRAM EVERY 8 HOURS 12/28/20   [provider]   digoxin (LANOXIN) 0.125 MG tablet TAKE 1 TABLET BY MOUTH ONCE A DAY ON TUESDAY, THURSDAY, SATURDAY, AND SUNDAY. 12/10/20   [provider]   digoxin (LANOXIN) 0.25 MG tablet TAKE 1 TABLET BY MOUTH ON MONDAY/WEDNESDAY AND FRIDAY 90 DAY(S) 12/17/20   [provider]   Fungoid Tincture 2 % Solution APPLY TWICE A DAY TO TOE NAIL 12/26/20   [provider]   levothyroxine (SYNTHROID) 88 MCG tablet Take 88 mcg by mouth every morning 02/07/21   [provider]   lisinopril (ZESTRIL) 10 MG tablet TAKE 1 TABLET ORALLY ONCE A DAY EVERY MORNING FOR 30 DAY(S) 12/17/20   [provider]   meloxicam (MOBIC) 7.5 MG tablet Take 7.5 mg by mouth daily 04/10/20   [provider]   raloxifene (EVISTA) 60 MG tablet Take 60 mg by mouth daily 02/12/21   [provider]  rosuvastatin (CRESTOR) 20 MG tablet Take 20 mg by  mouth nightly 12/18/20   [provider]   urea (CARMOL) 40 % cream APPLY 1 GRAM ONCE A DAY TO NAILS AND CALLUSES 07/02/20   [provider]       Review of Systems:   All other systems were reviewed and are negative except: as above    Physical Exam:     Vitals:    03/10/21 0400   BP: 111/54   Pulse: 86   Resp: 16   Temp: 98.4 F (36.9 C)   SpO2: 97%       Intake and Output Summary (Last 24 hours) at Date Time    Intake/Output Summary (Last 24 hours) at 03/10/2021 8119  Last data filed at 03/10/2021 0200  Gross per 24 hour   Intake 150 ml   Output --   Net 150 ml       General: awake, alert, oriented x 2-3, expressive and receptive aphasia; no acute distress.  HEENT: perrla, eomi, sclera anicteric  oropharynx clear without lesions, mucous membranes moist  Neck: supple, no lymphadenopathy, no thyromegaly, no JVD, no carotid bruits  Cardiovascular: regular rate and rhythm, no murmurs, rubs or gallops  Lungs: clear to auscultation bilaterally, without wheezing, rhonchi, or rales  Abdomen: soft, non-tender, non-distended; no palpable masses, no hepatosplenomegaly, normoactive bowel sounds, no rebound or guarding  Extremities: no clubbing, cyanosis, or edema  Neuro: cranial nerves grossly intact, strength 5/5 in upper and lower extremities, sensation intact, follows commands intermittently  Skin: no rashes or lesions noted      Labs:     Results       Procedure Component Value Units Date/Time    Lipid panel [[id number]]  (Abnormal) Collected: 03/10/21 0353    Specimen: Blood Updated: 03/10/21 0543     Cholesterol 121 mg/dL      Triglycerides 96 mg/dL      HDL 35 mg/dL      LDL Calculated 67 mg/dL      VLDL Calculated 19 mg/dL      Cholesterol / HDL Ratio 3.5 Index     Narrative:      Fasting  Last Dose Known?->Yes    Hemoglobin A1C [[id number]] Collected: 03/10/21 0353    Specimen: Blood Updated: 03/10/21 0533     Hemoglobin A1C 5.6 %      Average Estimated Glucose 114.0 mg/dL     Narrative:       Fasting  Last Dose Known?->Yes    Digoxin level [[id number]] Collected: 03/10/21 0353    Specimen: Blood Updated: 03/10/21 0449     Digoxin Level 0.7 ng/mL      Digoxin Date of Last Dose //unk     Digoxin Time of Last Dose unk    Narrative:      Fasting  Last Dose Known?->Yes    Prothrombin time/INR [[id number]] Collected: 03/10/21 0353    Specimen: Blood Updated: 03/10/21 0433     PT 12.1 sec      PT INR 1.0    Narrative:      Fasting  Last Dose Known?->Yes    Basic Metabolic Panel [[id number]]  (Abnormal) Collected: 03/10/21 0353    Specimen: Blood Updated: 03/10/21 0433     Glucose 131 mg/dL      BUN 34.7 mg/dL      Creatinine 0.7 mg/dL      Calcium 9.0 mg/dL      Sodium 425 mEq/L      Potassium  3.5 mEq/L      Chloride 97 mEq/L      CO2 24 mEq/L      Anion Gap 7.0    Narrative:      Fasting  Last Dose Known?->Yes    GFR [[id number]] Collected: 03/10/21 0353     Updated: 03/10/21 0433     EGFR >60.0       Narrative:      Fasting  Last Dose Known?->Yes    CBC without differential [[id number]]  (Abnormal) Collected: 03/10/21 0353    Specimen: Blood Updated: 03/10/21 0415     WBC 8.08 x10 3/uL      Hgb 12.2 g/dL      Hematocrit 78.2 %      Platelets 190 x10 3/uL      RBC 3.72 x10 6/uL      MCV 93.5 fL      MCH 32.8 pg      MCHC 35.1 g/dL      RDW 12 %      MPV 9.2 fL      Nucleated RBC 0.0 /100 WBC      Absolute NRBC 0.00 x10 3/uL     Narrative:      Fasting  Last Dose Known?->Yes    Urinalysis Reflex to Microscopic Exam- Reflex to Culture [[id number]]  (Abnormal) Collected: 03/09/21 2229     Updated: 03/09/21 2248     Urine Type Urine, Clean Ca     Color, UA Yellow     Clarity, UA Clear     Specific Gravity UA 1.009     Urine pH 6.5     Leukocyte Esterase, UA Large     Nitrite, UA Negative     Protein, UR Negative     Glucose, UA Negative     Ketones UA Negative     Urobilinogen, UA Normal mg/dL      Bilirubin, UA Negative     Blood, UA Negative     RBC, UA 0-2 /hpf      WBC, UA 11-25 /hpf      Squamous Epithelial Cells,  Urine 0-5 /hpf     COVID-19 (SARS-CoV-2) and Influenza A/B, NAA (Liat Rapid)- Admission [[id number]] Collected: 03/09/21 2033    Specimen: Culturette from Nasopharyngeal Updated: 03/09/21 2132     Purpose of COVID testing Diagnostic -PUI     SARS-CoV-2 Specimen Source Nasal Swab     SARS CoV 2 Overall Result Not Detected     Influenza A Not Detected     Influenza B Not Detected    Narrative:      o Collect and clearly label specimen type:  o PREFERRED-Upper respiratory specimen: One Nasal Swab in  Transport Media.  o Hand deliver to laboratory ASAP  Diagnostic -PUI    Troponin I [[id number]] Collected: 03/09/21 2033    Specimen: Blood Updated: 03/09/21 2126     Troponin I 0.02 ng/mL     Basic Metabolic Panel [[id number]]  (Abnormal) Collected: 03/09/21 2033    Specimen: Blood Updated: 03/09/21 2125     Glucose 106 mg/dL      BUN 64.4 mg/dL      Creatinine 0.8 mg/dL      Calcium 9.4 mg/dL      Sodium 034 mEq/L      Potassium 3.8 mEq/L      Chloride 94 mEq/L      CO2 25 mEq/L      Anion Gap 8.0    Lipase [[id number]] Collected: 03/09/21  2033    Specimen: Blood Updated: 03/09/21 2125     Lipase 38 U/L     Hepatic function panel (LFT) [[id number]] Collected: 03/09/21 2033    Specimen: Blood Updated: 03/09/21 2125     Bilirubin, Total 0.9 mg/dL      Bilirubin Direct 0.4 mg/dL      Bilirubin Indirect 0.5 mg/dL      AST (SGOT) 29 U/L      ALT 21 U/L      Alkaline Phosphatase 40 U/L      Protein, Total 7.5 g/dL      Albumin 4.3 g/dL      Globulin 3.2 g/dL      Albumin/Globulin Ratio 1.3    GFR [[id number]] Collected: 03/09/21 2033     Updated: 03/09/21 2125     EGFR >60.0       Lactic Acid [[id number]] Collected: 03/09/21 2033    Specimen: Blood Updated: 03/09/21 2103     Lactic Acid 1.0 mmol/L     CBC and differential [[id number]]  (Abnormal) Collected: 03/09/21 2033    Specimen: Blood Updated: 03/09/21 2102     WBC 9.88 x10 3/uL      Hgb 12.5 g/dL      Hematocrit 62.9 %      Platelets 214 x10 3/uL      RBC 3.84 x10 6/uL      MCV  93.0 fL      MCH 32.6 pg      MCHC 35.0 g/dL      RDW 13 %      MPV 9.5 fL      Neutrophils 75.8 %      Lymphocytes Automated 14.6 %      Monocytes 8.0 %      Eosinophils Automated 1.0 %      Basophils Automated 0.3 %      Immature Granulocytes 0.3 %      Nucleated RBC 0.0 /100 WBC      Neutrophils Absolute 7.49 x10 3/uL      Lymphocytes Absolute Automated 1.44 x10 3/uL      Monocytes Absolute Automated 0.79 x10 3/uL      Eosinophils Absolute Automated 0.10 x10 3/uL      Basophils Absolute Automated 0.03 x10 3/uL      Immature Granulocytes Absolute 0.03 x10 3/uL      Absolute NRBC 0.00 x10 3/uL     Glucose Whole Blood - POCT [[id number]]  (Abnormal) Collected: 03/09/21 2021     Updated: 03/09/21 2032     Whole Blood Glucose POCT 104 mg/dL             Radiology Results (24 Hour)       Procedure Component Value Units Date/Time    CT Head WO Contrast [[id number]] Collected: 03/09/21 2129    Order Status: Completed Updated: 03/09/21 2138    Narrative:      HISTORY: Difficulty with word finding.    COMPARISON: Brain MRI 03/04/2021.    TECHNIQUE: Axial noncontrast imaging through the head was performed.    This CT study was performed using radiation dose reduction techniques  including one or more of the following: automated exposure control,  adjustment of the mA and/or kV according to patient size, and the use of  iterative reconstruction technique.    FINDINGS: Again seen is a left temporal parenchymal hematoma with  moderate surrounding vasogenic edema, not significantly changed.  Subdural hemorrhage extends along the cerebral falx and left tentorial  leaflet.  The very thin left cerebral convexity subdural hematoma seen on  the comparison MRI is difficult to appreciate on this examination.  Ventricular size and chronic ischemic changes appear stable. Chronic  appearing nasal bone deformities are identified.      Impression:       Previously visualized thin left cerebral convexity subdural  hematoma not definitely seen on  this examination. The study is otherwise  without significant interval change as above.       Leandro Reasoner, MD   03/09/2021 9:35 PM          EKG 8/21:  HR 85, QTc , sinus rhythm, iRBBB, no ST elevations    ABCD2 Score for TIA     Estimates risk of stroke after a TIA, according to patient risk factors.  Age ? 60? Yes +1   BP ? 140/90 mmHg at initial evaluation? Yes +1   Clinical Features of the TIA: Unilateral Weakness +2   Speech Disturbance without Weakness +1   Other Symptoms +0   Duration of Symptoms? < 10 minutes +0   10-59 minutes +1   ? 60 minutes +2   Diabetes Mellitus in Patient's History? Yes +1     ABCD2 score     According to the validation study, 0-3 points: Low Risk.  2-Day Stroke Risk: 1.0%.  7-Day Stroke Risk: 1.2%.  90-Day Stroke Risk: 3.1%.   According to the validation study, 4-5 points: Moderate Risk.  2-Day Stroke Risk: 4.1%.  7-Day Stroke Risk: 5.9%.  90-Day Stroke Risk: 9.8%.   According to the validation study, 6-7 points: High Risk.  2-Day Stroke Risk: 8.1%.  7-Day Stroke Risk: 11.7%.  90-Day Stroke Risk: 17.8%.          Assessment:     Patient Active Problem List   Diagnosis    Nontraumatic acute subdural hemorrhage    Aphasia       87 y.o. female visiting from [place] hx HTN, HLD, hypothyroid, irregular heartbeat, sternotomy for aortic valve replacement >10 years ago then repeat aortic valve replacement 5-6 years ago (procedure sounds like TAVR), intracerebral hemorrhage after fall 2021 [facility] [place], [place] - no residual deficit per daughter), trauma admit 8/15-8/18 for left temporal and smaller right temporal hemorrhagic contusion + left greater than right SDH (only right SDH is along tentorial leaflet) + IVH who presents with expressive and receptive aphasia.    Plan:   Expressive and receptive aphasia - CT head shows stable left temporal IPH with cerebral edema and left parafalcine/tentorial leaflet SDH not significantly changed from MRI 8/16. SBP<150. Neuro checks.  First rule out CVA, then if no CVA consider video EEG. PT/OT/SLP. Telemetry. No aspirin given IPH/SDH. Crestor. MRI brain / MRA head + neck. Echo given 2 reported aortic valve replacements. PT/OT/SLP.    Hyponatremia - Follow. Last admit sodium was 127-132.      UTI - Family reports confusion with UTIs before but never presented like this. Ceftin + probiotic.    Hx HTN, HLD, hypothyroid, irregular heartbeat, sternotomy for aortic valve replacement >10 years ago then repeat aortic valve replacement 5-6 years ago (procedure sounds like TAVR), intracerebral hemorrhage after fall 2021 [facility] [place], [place] - no residual deficit per daughter), trauma admit 8/15-8/18 for left temporal and smaller rigth temporal hemorrhagic contusion + left greater than right SDH (only right SDH is along tentorial leaflet) + IVH - Noted.     DVT/GI prophylaxis - SCDs. Defer to neurosurgery when she  may start heparin for DVT prophylaxis.     Status/Rationale: Neuro-IMC. I asked the stroke team at 7:45 to follow up her MRI.     Signed by: Emeline Darling, MD, MD   cc:Pcp, Kathreen Cosier, MD

## 2021-03-10 ENCOUNTER — Observation Stay: Payer: Medicare Other

## 2021-03-10 ENCOUNTER — Encounter: Payer: Self-pay | Admitting: Hospitalist

## 2021-03-10 ENCOUNTER — Observation Stay (HOSPITAL_BASED_OUTPATIENT_CLINIC_OR_DEPARTMENT_OTHER): Payer: Medicare Other

## 2021-03-10 DIAGNOSIS — I619 Nontraumatic intracerebral hemorrhage, unspecified: Secondary | ICD-10-CM | POA: Diagnosis present

## 2021-03-10 DIAGNOSIS — S06359A Traumatic hemorrhage of left cerebrum with loss of consciousness of unspecified duration, initial encounter: Secondary | ICD-10-CM

## 2021-03-10 DIAGNOSIS — I639 Cerebral infarction, unspecified: Secondary | ICD-10-CM

## 2021-03-10 DIAGNOSIS — S065X9A Traumatic subdural hemorrhage with loss of consciousness of unspecified duration, initial encounter: Secondary | ICD-10-CM

## 2021-03-10 DIAGNOSIS — G936 Cerebral edema: Secondary | ICD-10-CM

## 2021-03-10 DIAGNOSIS — S065XAA Traumatic subdural hemorrhage with loss of consciousness status unknown, initial encounter: Secondary | ICD-10-CM | POA: Diagnosis present

## 2021-03-10 DIAGNOSIS — E871 Hypo-osmolality and hyponatremia: Secondary | ICD-10-CM

## 2021-03-10 LAB — BASIC METABOLIC PANEL
Anion Gap: 7 (ref 5.0–15.0)
BUN: 10 mg/dL (ref 7.0–19.0)
CO2: 24 mEq/L (ref 22–29)
Calcium: 9 mg/dL (ref 7.9–10.2)
Chloride: 97 mEq/L — ABNORMAL LOW (ref 100–111)
Creatinine: 0.7 mg/dL (ref 0.6–1.0)
Glucose: 131 mg/dL — ABNORMAL HIGH (ref 70–100)
Potassium: 3.5 mEq/L (ref 3.5–5.1)
Sodium: 128 mEq/L — ABNORMAL LOW (ref 136–145)

## 2021-03-10 LAB — CBC
Absolute NRBC: 0 10*3/uL (ref 0.00–0.00)
Hematocrit: 34.8 % (ref 34.7–43.7)
Hgb: 12.2 g/dL (ref 11.4–14.8)
MCH: 32.8 pg (ref 25.1–33.5)
MCHC: 35.1 g/dL (ref 31.5–35.8)
MCV: 93.5 fL (ref 78.0–96.0)
MPV: 9.2 fL (ref 8.9–12.5)
Nucleated RBC: 0 /100 WBC (ref 0.0–0.0)
Platelets: 190 10*3/uL (ref 142–346)
RBC: 3.72 10*6/uL — ABNORMAL LOW (ref 3.90–5.10)
RDW: 12 % (ref 11–15)
WBC: 8.08 10*3/uL (ref 3.10–9.50)

## 2021-03-10 LAB — ECHOCARDIOGRAM ADULT COMPLETE W CLR/ DOPP WAVEFORM
AV Area (Cont Eq VTI): 1.76
AV Area (Cont Eq VTI): 1.761
AV Mean Gradient: 13
AV Peak Velocity: 250
AV Peak Velocity: 276
Ao Root Diameter (2D): 2.3
BP Mod LV Ejection Fraction: 66.3
IVS Diastolic Thickness (2D): 1.2
LA Dimension (2D): 4.1
LA Volume Index (BP A-L): 0.049
LVID diastole (2D): 3.5
LVID systole (2D): 1.7
MV Area (PHT): 1.445
MV E/A: 0.6
MV E/A: 0.617
MV E/e' (Average): 22.05
MV Mean Gradient: 6
Prox Ascending Aorta Diameter: 2.7
Pulmonary Valve Findings: NORMAL
RV Basal Diastolic Dimension: 3
RV Function: NORMAL
Site RA Size (AS): NORMAL
Site RV Size (AS): NORMAL
TAPSE: 1.56
Tricuspid Valve Findings: NORMAL

## 2021-03-10 LAB — LIPID PANEL
Cholesterol / HDL Ratio: 3.5 Index
Cholesterol: 121 mg/dL (ref 0–199)
HDL: 35 mg/dL — ABNORMAL LOW (ref 40–9999)
LDL Calculated: 67 mg/dL (ref 0–99)
Triglycerides: 96 mg/dL (ref 34–149)
VLDL Calculated: 19 mg/dL (ref 10–40)

## 2021-03-10 LAB — PT/INR
PT INR: 1 (ref 0.9–1.1)
PT: 12.1 s (ref 10.1–12.9)

## 2021-03-10 LAB — OSMOLALITY, URINE: Urine Osmolality: 384 mosm/kg (ref 300–1094)

## 2021-03-10 LAB — HEMOGLOBIN A1C
Average Estimated Glucose: 114 mg/dL
Hemoglobin A1C: 5.6 % (ref 4.6–5.9)

## 2021-03-10 LAB — DIGOXIN LEVEL: Digoxin Level: 0.7 ng/mL (ref 0.5–2.0)

## 2021-03-10 LAB — GFR: EGFR: >60

## 2021-03-10 LAB — SODIUM, URINE, RANDOM: Urine Sodium Random: 78 mEq/L

## 2021-03-10 MED ORDER — AMLODIPINE BESYLATE 5 MG PO TABS
5.0000 mg | ORAL_TABLET | Freq: Every day | ORAL | Status: DC
Start: 2021-03-10 — End: 2021-03-12
  Administered 2021-03-10 – 2021-03-12 (×3): 5 mg via ORAL
  Filled 2021-03-10 (×3): qty 1

## 2021-03-10 MED ORDER — DIGOXIN 125 MCG PO TABS
0.1250 mg | ORAL_TABLET | ORAL | Status: DC
Start: 2021-03-11 — End: 2021-03-12
  Administered 2021-03-11: 09:00:00 0.125 mg via ORAL
  Filled 2021-03-10: qty 1

## 2021-03-10 MED ORDER — ONDANSETRON HCL 4 MG/2ML IJ SOLN
4.0000 mg | Freq: Four times a day (QID) | INTRAMUSCULAR | Status: DC | PRN
Start: 2021-03-10 — End: 2021-03-12

## 2021-03-10 MED ORDER — CEFUROXIME AXETIL 250 MG PO TABS
250.0000 mg | ORAL_TABLET | Freq: Two times a day (BID) | ORAL | Status: DC
Start: 2021-03-10 — End: 2021-03-12
  Administered 2021-03-10 – 2021-03-12 (×5): 250 mg via ORAL
  Filled 2021-03-10 (×6): qty 1

## 2021-03-10 MED ORDER — ROSUVASTATIN CALCIUM 20 MG PO TABS
20.0000 mg | ORAL_TABLET | Freq: Every evening | ORAL | Status: DC
Start: 2021-03-10 — End: 2021-03-12
  Administered 2021-03-10 – 2021-03-11 (×2): 20 mg via ORAL
  Filled 2021-03-10: qty 2
  Filled 2021-03-10: qty 1

## 2021-03-10 MED ORDER — SENNOSIDES-DOCUSATE SODIUM 8.6-50 MG PO TABS
2.0000 | ORAL_TABLET | Freq: Two times a day (BID) | ORAL | Status: DC | PRN
Start: 2021-03-10 — End: 2021-03-12

## 2021-03-10 MED ORDER — BRIVARACETAM 100 MG PO TABS
100.0000 mg | ORAL_TABLET | Freq: Two times a day (BID) | ORAL | Status: DC
Start: 2021-03-10 — End: 2021-03-12
  Administered 2021-03-10 – 2021-03-12 (×4): 100 mg via ORAL
  Filled 2021-03-10 (×4): qty 1

## 2021-03-10 MED ORDER — AMLODIPINE BESYLATE 5 MG PO TABS
2.5000 mg | ORAL_TABLET | Freq: Every evening | ORAL | Status: DC
Start: 2021-03-10 — End: 2021-03-12
  Administered 2021-03-10 – 2021-03-11 (×2): 2.5 mg via ORAL
  Filled 2021-03-10 (×2): qty 1

## 2021-03-10 MED ORDER — DIGOXIN 125 MCG PO TABS
0.2500 mg | ORAL_TABLET | ORAL | Status: DC
Start: 2021-03-10 — End: 2021-03-12
  Administered 2021-03-10 – 2021-03-12 (×2): 0.25 mg via ORAL
  Filled 2021-03-10 (×2): qty 1
  Filled 2021-03-10: qty 2

## 2021-03-10 MED ORDER — LEVOTHYROXINE SODIUM 88 MCG PO TABS
88.0000 ug | ORAL_TABLET | Freq: Every day | ORAL | Status: DC
Start: 2021-03-10 — End: 2021-03-12
  Administered 2021-03-10 – 2021-03-12 (×3): 88 ug via ORAL
  Filled 2021-03-10 (×3): qty 1

## 2021-03-10 MED ORDER — ONDANSETRON 4 MG PO TBDP
4.0000 mg | ORAL_TABLET | Freq: Four times a day (QID) | ORAL | Status: DC | PRN
Start: 2021-03-10 — End: 2021-03-12

## 2021-03-10 MED ORDER — ACETAMINOPHEN 325 MG PO TABS
650.0000 mg | ORAL_TABLET | Freq: Four times a day (QID) | ORAL | Status: DC | PRN
Start: 2021-03-10 — End: 2021-03-12

## 2021-03-10 MED ORDER — LABETALOL HCL 5 MG/ML IV SOLN (WRAP)
10.0000 mg | INTRAVENOUS | Status: DC | PRN
Start: 2021-03-10 — End: 2021-03-12

## 2021-03-10 MED ORDER — MIDAZOLAM HCL 1 MG/ML IJ SOLN (WRAP)
2.0000 mg | INTRAMUSCULAR | Status: DC | PRN
Start: 2021-03-10 — End: 2021-03-12

## 2021-03-10 MED ORDER — GADOTERATE MEGLUMINE 7.5 MMOL/15ML IV SOLN (CLARISCAN)
0.2000 mL/kg | Freq: Once | INTRAVENOUS | Status: AC | PRN
Start: 2021-03-10 — End: 2021-03-10
  Administered 2021-03-10: 11:00:00 12 mL via INTRAVENOUS

## 2021-03-10 MED ORDER — FAMOTIDINE 20 MG PO TABS
20.0000 mg | ORAL_TABLET | Freq: Every day | ORAL | Status: DC | PRN
Start: 2021-03-10 — End: 2021-03-12

## 2021-03-10 MED ORDER — RALOXIFENE HCL 60 MG PO TABS
60.0000 mg | ORAL_TABLET | Freq: Every day | ORAL | Status: DC
Start: 2021-03-10 — End: 2021-03-12
  Administered 2021-03-10 – 2021-03-12 (×3): 60 mg via ORAL
  Filled 2021-03-10 (×4): qty 1

## 2021-03-10 MED ORDER — LISINOPRIL 10 MG PO TABS
10.0000 mg | ORAL_TABLET | Freq: Every day | ORAL | Status: DC
Start: 2021-03-10 — End: 2021-03-12
  Administered 2021-03-10 – 2021-03-12 (×3): 10 mg via ORAL
  Filled 2021-03-10 (×3): qty 1

## 2021-03-10 MED ORDER — RISAQUAD PO CAPS
1.0000 | ORAL_CAPSULE | Freq: Every day | ORAL | Status: DC
Start: 2021-03-10 — End: 2021-03-12
  Administered 2021-03-10 – 2021-03-12 (×3): 1 via ORAL
  Filled 2021-03-10 (×3): qty 1

## 2021-03-10 MED ORDER — HYDRALAZINE HCL 20 MG/ML IJ SOLN
10.0000 mg | INTRAMUSCULAR | Status: DC | PRN
Start: 2021-03-10 — End: 2021-03-11
  Administered 2021-03-10: 02:00:00 10 mg via INTRAVENOUS
  Filled 2021-03-10: qty 1

## 2021-03-10 NOTE — Plan of Care (Signed)
Problem: Moderate/High Fall Risk Score >5  Goal: Patient will remain free of falls  Outcome: Progressing  Flowsheets (Taken 03/10/2021 0200)  High (Greater than 13):   HIGH-Bed alarm on at all times while patient in bed   HIGH-Initiate use of floor mats as appropriate   HIGH-Consider use of low bed     Problem: Safety  Goal: Patient will be free from injury during hospitalization  Outcome: Progressing  Flowsheets (Taken 03/10/2021 0439)  Patient will be free from injury during hospitalization:   Assess patient's risk for falls and implement fall prevention plan of care per policy   Provide and maintain safe environment   Use appropriate transfer methods   Ensure appropriate safety devices are available at the bedside   Include patient/ family/ care giver in decisions related to safety   Hourly rounding   Assess for patients risk for elopement and implement Elopement Risk Plan per policy   Provide alternative method of communication if needed (communication boards, writing)  Goal: Patient will be free from infection during hospitalization  Outcome: Progressing  Flowsheets (Taken 03/10/2021 0439)  Free from Infection during hospitalization:   Assess and monitor for signs and symptoms of infection   Monitor lab/diagnostic results   Monitor all insertion sites (i.e. indwelling lines, tubes, urinary catheters, and drains)   Encourage patient and family to use good hand hygiene technique     Problem: Pain  Goal: Pain at adequate level as identified by patient  Outcome: Progressing  Flowsheets (Taken 03/10/2021 0439)  Pain at adequate level as identified by patient:   Identify patient comfort function goal   Assess pain on admission, during daily assessment and/or before any "as needed" intervention(s)   Reassess pain within 30-60 minutes of any procedure/intervention, per Pain Assessment, Intervention, Reassessment (AIR) Cycle   Evaluate if patient comfort function goal is met   Evaluate patient's satisfaction with pain  management progress   Offer non-pharmacological pain management interventions   Consult/collaborate with Pain Service   Include patient/patient care companion in decisions related to pain management as needed     Problem: Side Effects from Pain Analgesia  Goal: Patient will experience minimal side effects of analgesic therapy  Outcome: Progressing  Flowsheets (Taken 03/10/2021 0439)  Patient will experience minimal side effects of analgesic therapy:   Monitor/assess patient's respiratory status (RR depth, effort, breath sounds)   Assess for changes in cognitive function   Prevent/manage side effects per LIP orders (i.e. nausea, vomiting, pruritus, constipation, urinary retention, etc.)     Problem: Discharge Barriers  Goal: Patient will be discharged home or other facility with appropriate resources  Outcome: Progressing  Flowsheets (Taken 03/10/2021 0439)  Discharge to home or other facility with appropriate resources: Provide appropriate patient education     Problem: Psychosocial and Spiritual Needs  Goal: Demonstrates ability to cope with hospitalization/illness  Outcome: Progressing  Flowsheets (Taken 03/10/2021 0439)  Demonstrates ability to cope with hospitalizations/illness:   Encourage verbalization of feelings/concerns/expectations   Provide quiet environment   Include patient/ patient care companion in decisions

## 2021-03-10 NOTE — Progress Notes (Signed)
NURSING PROGRESS NOTE    [name] is a 87 y.o. female  Admitted 03/09/2021  8:28 PM [facility] day 0)    Indication for Neuro [facility] Status:   Frtequent monitoring    Major Shift Events:   On cvEEG monitor    Review of Systems  Neuro:  A&Ox3-4 (confused with time or situation sometimes), MAE, full sensation, follows commands, hard of hearing, aphagia, pupil-3RB    Cardiac:  Afebrile  SBP WNL  Pulse palpable    Respiratory:  RA  Lungs sounds clear    GI/GU:  Regular diet thin liquid  Continent x2  OOB close stand by assist    Skin:  See flow sheet for details    BM this shift? N    Recent Labs   Lab 03/10/21  0353   Sodium 128*   Potassium 3.5   Chloride 97*   CO2 24   BUN 10.0   Creatinine 0.7   EGFR >60.0   Glucose 131*   Calcium 9.0       Recent Labs   Lab 03/10/21  0353   WBC 8.08   Hgb 12.2   Hematocrit 34.8   Platelets 190       LDAs  Patient Lines/Drains/Airways Status       Active Lines, Drains and Airways       Name Placement date Placement time Site Days    Peripheral IV 03/09/21 18 G Standard Left Antecubital 03/09/21  2019  Antecubital  less than 1                    Indication for Central Access and estimated target removal date?  NA    Indication for Foley and estimated target removal date?  NA    Safety Checklist    Fall Precautions Y    Avasys N    Seizure Precautions Y    Aspiration Precautions Y   Belongings Checked Y     Service Essentials  Medication Teaching Y   Purposeful Hourly Rounding Y       Interpreter Services:  Does the patient require an Interpreter? N

## 2021-03-10 NOTE — Progress Notes (Signed)
LONG TERM MONITORING    As per order, EEG electrodes CT Compatible were applied with collodion /paste in accordance to the International 10-20 system and gauze wrap placed for electrode security. The assigned nurse were informed of the recording protocol and the patient event button.

## 2021-03-10 NOTE — UM Notes (Signed)
Observation Review 10/37:    87 year old female admitted to [facility] on 8/21 for AMS.  Patient presents with aphasia.  Neurology consulted and MRI ordered.    Vitals:  Temp 98.4  HR 91  RR 23  181/88  97% on RA    Labs:  WBC 9.88    Imaging:  CT Head WO Contrast [[id number]] (Order [id number])   Previously visualized thin left cerebral convexity subdural  hematoma not definitely seen on this examination. The study is otherwise  without significant interval change as above.        Leandro Reasoner, MD   03/09/2021 9:35 PM    Ekg:  SINUS RHYTHM WITH PREMATURE ATRIAL COMPLEXES  INCOMPLETE RIGHT BUNDLE BRANCH BLOCK  NONSPECIFIC ST AND T WAVE ABNORMALITY  ABNORMAL ECG  WHEN COMPARED WITH ECG OF 04-Mar-2021 23:29,  NO SIGNIFICANT CHANGE WAS FOUND    Pmh: HTN, HLD, hypothyroid, irregular heartbeat, sternotomy for aortic valve replacement >10 years ago then repeat aortic valve replacement 5-6 years ago     Plan:    Expressive and receptive aphasia - CT head shows stable left temporal IPH with cerebral edema and left parafalcine/tentorial leaflet SDH not significantly changed from MRI 8/16. SBP<150. Neuro checks. First rule out CVA, then if no CVA consider video EEG. PT/OT/SLP. Telemetry. No aspirin given IPH/SDH. Crestor. MRI brain / MRA head + neck. Echo given 2 reported aortic valve replacements. PT/OT/SLP.     Hyponatremia - Follow. Last admit sodium was 127-132.       UTI - Family reports confusion with UTIs before but never presented like this. Ceftin + probiotic.     Hx HTN, HLD, hypothyroid, irregular heartbeat, sternotomy for aortic valve replacement >10 years ago then repeat aortic valve replacement 5-6 years ago (procedure sounds like TAVR), intracerebral hemorrhage after [facility] [place] [place] - no residual deficit per daughter), trauma admit 8/15-8/18 for left temporal and smaller rigth temporal hemorrhagic contusion + left greater than right SDH (only right SDH is along tentorial leaflet) + IVH -  Noted.      DVT/GI prophylaxis - SCDs. Defer to neurosurgery when she may start heparin for DVT prophylaxis.       Current Facility-Administered Medications   Medication Dose Route Frequency Last Rate Last Admin    acetaminophen (TYLENOL) tablet 650 mg  650 mg Oral Q6H PRN        amLODIPine (NORVASC) tablet 2.5 mg  2.5 mg Oral QHS        amLODIPine (NORVASC) tablet 5 mg  5 mg Oral Daily        cefuroxime (CEFTIN) tablet 250 mg  250 mg Oral Q12H SCH        [START ON 03/11/2021] digoxin (LANOXIN) tablet 0.125 mg  0.125 mg Oral Once per day on Sun Tue Thu Sat        digoxin (LANOXIN) tablet 0.25 mg  0.25 mg Oral Q Mon/Wed/Fri (AM)        famotidine (PEPCID) tablet 20 mg  20 mg Oral Daily PRN        hydrALAZINE (APRESOLINE) injection 10 mg  10 mg Intravenous Q2H PRN   10 mg at 03/10/21 0229    labetalol (NORMODYNE,TRANDATE) injection 10 mg  10 mg Intravenous Q2H PRN        lactobacillus/streptococcus (RISAQUAD) capsule 1 capsule  1 capsule Oral Daily        levothyroxine (SYNTHROID) tablet 88 mcg  88 mcg Oral Daily  lisinopril (ZESTRIL) tablet 10 mg  10 mg Oral Daily        midazolam (VERSED) injection 2 mg  2 mg Intravenous Q10 Min PRN        ondansetron (ZOFRAN-ODT) disintegrating tablet 4 mg  4 mg Oral Q6H PRN        Or    ondansetron (ZOFRAN) injection 4 mg  4 mg Intravenous Q6H PRN        raloxifene (EVISTA) tablet 60 mg  60 mg Oral Daily        rosuvastatin (CRESTOR) tablet 20 mg  20 mg Oral QHS        senna-docusate (PERICOLACE) 8.6-50 MG per tablet 2 tablet  2 tablet Oral BID PRN         UTILIZATION REVIEW CONTACT: Name: Diona Browner, RN, CM  Clinical Case Manager  - Utilization Review  [facility]  Address:  [address] [place], [place]  [address]  NPI:   [id number]  Tax ID:  [id number]  Direct: [phone] (Voicemail only)  Main: [phone]  Fax: [phone]  Email: [email or web address]     Please use fax number [phone] to provide authorization for hospital services or to  request additional information.

## 2021-03-10 NOTE — Consults (Signed)
[facility] Consultation Note                                       Date Time: 03/10/21 11:18 AM  Patient Name: [name]  Requesting Physician: Gae Dry, MD  Date of Admission: 03/09/2021    CC / Reason for Consultation  aphasia          Assessment:   Expressive > receptive aphasia in a 87 y/o female with a PMH significant for HTN, HLD, hypothyroisism, prior fall c/b ICH (2020). MRI this admission reveals decreased size of L temporal lobe hematoma and smaller hemorrhagic contusion in the R  parietal lobe; stable L SDH; mild global volume loss. Etiology for her aphasia concerning for seizure with prolonged post ictal phenomena. Suspect the L temporal/SDH contributing as well         Plan:   -MRI brain reviewed   -Recommend 24 hour vEEG (ordered)  -Will start Briviact 100 mg BID given high suspicion for seizure  -No AC/antiplatelets/NSAIDs  -Seizure precautions to prevent injury during a convulsive episode:   - Patient bed in the lowest position with side rails up and padded  - Suction and oxygen equipment must be available at the patient's bedside  - Ensure IV access established  - Avoid situations that could precipitate a seizure (blinking lights, fatigue).  - Ativan 1-2 mg IV prn seizure lasting 5 minutes or 2 within 30 minutes without interval return to baseline mental status   -Supportive therapies  -Will follow     Neurology Attending Note:    Total time of the encounter was 80 minutes, of which >50% was spent reviewing the chart, updating orders, coordinating care, and at the bedside counseling. I performed the substantive portion of the visit by providing more than 50% of the total encounter time, and spent . Patient was also seen by  APP  who spent .  I reviewed the chart, relevant neuro-imaging and labs on the above patient.  I personally examined the patient.  I also conferred with the midlevel and agree with the findings and plan outlined above.           Kendrick Fries,  M.D.  Board Certified Neurology, ABPN  Board Certified NeuroImaging, UCNS   [facility]       HPI   [name] is a 87 y.o. female in a 87 y/o female with a PMH significant for HTN, HLD, hypothyroisism, prior fall c/b ICH (2020) who presents to the hospital with aphasia. Per family, 1 week ago pt had an unwitnessed mechanical fall. She was taken to [facility] where HCT revealed small left frontal/parietal subdural hematoma; left parietotemporal. NSGY was consulted who deemed no acute neurosurgical intervention. Pt's exam was apparently non focal at that time, susbsquently discharged home with no needs. 2 days ago, pt had an additional fall for which she was not taken to the hospital. Yesterday, pt was noted to have difficulty word finding and appeared confused, prompting ED evaluation. At baseline, pt able to converse normally, oriented x3. She does require assistance with ADLs, usesa a walker for ambulation, lives with daugther. Hx of pAF, not on AC d/t fall risk. Hx obtained from son at bedside and chart as pt is poor historian. Neuro ROS limited 2/2 pt's aphasia.       Past Medical Hx     Past Medical History:  Diagnosis Date    Bleeding in head following injury with loss of consciousness     a year ago    Hyperlipidemia     Hypertension     Hypothyroidism     Irregular heartbeat           Past Surgical Hx:     Past Surgical History:   Procedure Laterality Date    AORTIC VALVE REPLACEMENT      first AVR required sternotomy, >10 years ago per patient    BACK SURGERY      HYSTERECTOMY      REPLACEMENT TOTAL KNEE Bilateral     left total, right "half knee" replacement    TRANSCATHETER AORTIC VALVE REPLACEMENT  2016    5-6 years ago per patient    WRIST SURGERY Left 2017        Family Medical History:      Family History   Problem Relation Age of Onset    Stroke Neg Hx     Intracerebral hemorrhage Neg Hx        Social Hx     Social History     Socioeconomic History    Marital status: Unknown   Tobacco Use     Smoking status: Never    Smokeless tobacco: Never   Vaping Use    Vaping Use: Never used   Substance and Sexual Activity    Alcohol use: Yes     Alcohol/week: 3.0 standard drinks     Types: 3 Glasses of wine per week     Comment: 1-2 a week    Drug use: Never     Social Determinants of Health     Financial Resource Strain: Low Risk     Difficulty of Paying Living Expenses: Not hard at all   Food Insecurity: No Food Insecurity    Worried About Programme researcher, broadcasting/film/video in the Last Year: Never true    Barista in the Last Year: Never true   Transportation Needs: No Transportation Needs    Lack of Transportation (Medical): No    Lack of Transportation (Non-Medical): No   Stress: No Stress Concern Present    Feeling of Stress : Not at all   Social Connections: Moderately Integrated    Frequency of Communication with Friends and Family: More than three times a week    Frequency of Social Gatherings with Friends and Family: More than three times a week    Attends Religious Services: More than 4 times per year    Active Member of [employer] or Organizations: Yes    Attends Banker Meetings: More than 4 times per year    Marital Status: Widowed   Catering manager Violence: Not At Risk    Fear of Current or Ex-Partner: No    Emotionally Abused: No    Physically Abused: No    Sexually Abused: No   Housing Stability: Unknown    Unable to Pay for Housing in the Last Year: No    Unstable Housing in the Last Year: No       Meds     Home :   Prior to Admission medications    Medication Sig Start Date End Date Taking? Authorizing Provider   amLODIPine (NORVASC) 5 MG tablet 5 mg Takes 0.5 tab of 10mg  tab. Total of 5mg  taken per night. 09/25/20  Yes [provider]   digoxin (LANOXIN) 0.125 MG tablet Take 125 mcg by mouth Once daily every  Sunday, Tuesday, Thursday and Saturday evening 12/10/20  Yes [provider]   digoxin (LANOXIN) 0.25 MG tablet Take 250 mcg by mouth Once every Monday, Wednesday and Friday  morning 12/17/20  Yes [provider]   levothyroxine (SYNTHROID) 88 MCG tablet Take 88 mcg by mouth every morning 02/07/21  Yes [provider]   lisinopril (ZESTRIL) 10 MG tablet TAKE 1 TABLET ORALLY ONCE A DAY EVERY MORNING FOR 30 DAY(S) 12/17/20  Yes [provider]   raloxifene (EVISTA) 60 MG tablet Take 60 mg by mouth daily 02/12/21  Yes [provider]   rosuvastatin (CRESTOR) 20 MG tablet Take 20 mg by mouth nightly 12/18/20  Yes [provider]   acetaminophen (TYLENOL) 325 MG tablet Take 2 tablets (650 mg total) by mouth every 6 (six) hours 03/06/21   Pincus Badder, FNP   Fungoid Tincture 2 % Solution APPLY TWICE A DAY TO TOE NAIL 12/26/20   [provider]   meloxicam (MOBIC) 7.5 MG tablet Take 7.5 mg by mouth daily 04/10/20   [provider]   urea (CARMOL) 40 % cream APPLY 1 GRAM ONCE A DAY TO NAILS AND CALLUSES 07/02/20   [provider]   diclofenac Sodium (VOLTAREN) 1 % Gel topical gel APPLY 1 GRAM EVERY 8 HOURS 12/28/20 03/10/21  [provider]      Inpatient :   Current Facility-Administered Medications   Medication Dose Route Frequency    amLODIPine  2.5 mg Oral QHS    amLODIPine  5 mg Oral Daily    cefuroxime  250 mg Oral Q12H [facility]    [START ON 03/11/2021] digoxin  0.125 mg Oral Once per day on Sun Tue Thu Sat    digoxin  0.25 mg Oral Q Mon/Wed/Fri (AM)    lactobacillus/streptococcus  1 capsule Oral Daily    levothyroxine  88 mcg Oral Daily    lisinopril  10 mg Oral Daily    raloxifene  60 mg Oral Daily    rosuvastatin  20 mg Oral QHS         Allergies    Codeine      Review of Systems   Pertinent items are noted in HPI.  All other systems were reviewed and are negative except for that mentioned in the HPI    Physical Exam:   Temp:  [98.3 F (36.8 C)-98.6 F (37 C)] 98.6 F (37 C)  Heart Rate:  [79-91] 89  Resp Rate:  [14-23] 21  BP: (91-181)/(50-88) 129/59     Vital Signs:  Reviewed    General: Well developed and well  nourished. No acute distress. Cooperative with the exam  ENT: Normal oral mucosa, no ear or nose discharge  Neck: Symmetric, no deformities  CV: RRR  Resp: No audible wheezing, normal work of breathing  Abd: Soft, nondistended  Skin: Intact, extremities normal in color  Psych: Affect is normal, good insight    Mental Status: The patient is awake, alert and oriented to person, place. Orientation limited 2/2 aphasia.  Expressive > receptive aphasia. Able to follow simple commands, difficulty with 2 step commands   Fund of knowledge, memory could not be formally evaluated   Attention span and concentration appear normal.      Cranial nerves:   -CN II: Visual fields full to bedside confrontation   -CN III, IV, VI: Pupils equal, round, and reactive to light; extraocular movements intact; no ptosis              -  CN V: Facial sensation intact in V1 through V3 distributions   -CN VII: Face symmetric   -CN VIII: Hearing intact to conversational speech   -CN IX, X:  normal phonation   -CN XI: Symmetric full strength of sternocleidomastoid and trapezius muscles   -CN XII: Tongue protrudes midline    Motor: Muscle tone normal without spasticity or flaccidity. No atrophy.  No pronator drift.     UEs:   Deltoid Bicep Tricep WE WF Grip IO   Right 5 5 5   5     Left 5 5 5   5       LEs   HF HE KF KE PF DF   Right 5  5 5 5 5    Left 5  5 5 5 5      Sensory:   Light touch intact.  Temperature intact.    Reflexes:      B T BR P A   Right 2 2 2 2 1    Left 2 2 2 2 1      Plantars: down going bilaterally     Coordination: FTN difficult to test. No tremors    Gait: deferred         Labs:     Results       Procedure Component Value Units Date/Time    Lipid panel [[id number]]  (Abnormal) Collected: 03/10/21 0353    Specimen: Blood Updated: 03/10/21 0543     Cholesterol 121 mg/dL      Triglycerides 96 mg/dL      HDL 35 mg/dL      LDL Calculated 67 mg/dL      VLDL Calculated 19 mg/dL      Cholesterol / HDL Ratio 3.5 Index     Narrative:       Fasting  Last Dose Known?->Yes    Hemoglobin A1C [[id number]] Collected: 03/10/21 0353    Specimen: Blood Updated: 03/10/21 0533     Hemoglobin A1C 5.6 %      Average Estimated Glucose 114.0 mg/dL     Narrative:      Fasting  Last Dose Known?->Yes    Digoxin level [[id number]] Collected: 03/10/21 0353    Specimen: Blood Updated: 03/10/21 0449     Digoxin Level 0.7 ng/mL      Digoxin Date of Last Dose //unk     Digoxin Time of Last Dose unk    Narrative:      Fasting  Last Dose Known?->Yes    Prothrombin time/INR [[id number]] Collected: 03/10/21 0353    Specimen: Blood Updated: 03/10/21 0433     PT 12.1 sec      PT INR 1.0    Narrative:      Fasting  Last Dose Known?->Yes    Basic Metabolic Panel [[id number]]  (Abnormal) Collected: 03/10/21 0353    Specimen: Blood Updated: 03/10/21 0433     Glucose 131 mg/dL      BUN 95.6 mg/dL      Creatinine 0.7 mg/dL      Calcium 9.0 mg/dL      Sodium 387 mEq/L      Potassium 3.5 mEq/L      Chloride 97 mEq/L      CO2 24 mEq/L      Anion Gap 7.0    Narrative:      Fasting  Last Dose Known?->Yes    GFR [[id number]] Collected: 03/10/21 0353     Updated: 03/10/21 0433     EGFR >60.0  Narrative:      Fasting  Last Dose Known?->Yes    CBC without differential [[id number]]  (Abnormal) Collected: 03/10/21 0353    Specimen: Blood Updated: 03/10/21 0415     WBC 8.08 x10 3/uL      Hgb 12.2 g/dL      Hematocrit 40.9 %      Platelets 190 x10 3/uL      RBC 3.72 x10 6/uL      MCV 93.5 fL      MCH 32.8 pg      MCHC 35.1 g/dL      RDW 12 %      MPV 9.2 fL      Nucleated RBC 0.0 /100 WBC      Absolute NRBC 0.00 x10 3/uL     Narrative:      Fasting  Last Dose Known?->Yes    Urinalysis Reflex to Microscopic Exam- Reflex to Culture [[id number]]  (Abnormal) Collected: 03/09/21 2229     Updated: 03/09/21 2248     Urine Type Urine, Clean Ca     Color, UA Yellow     Clarity, UA Clear     Specific Gravity UA 1.009     Urine pH 6.5     Leukocyte Esterase, UA Large     Nitrite, UA Negative     Protein, UR  Negative     Glucose, UA Negative     Ketones UA Negative     Urobilinogen, UA Normal mg/dL      Bilirubin, UA Negative     Blood, UA Negative     RBC, UA 0-2 /hpf      WBC, UA 11-25 /hpf      Squamous Epithelial Cells, Urine 0-5 /hpf     COVID-19 (SARS-CoV-2) and Influenza A/B, NAA ([facility] Rapid)- Admission [[id number]] Collected: 03/09/21 2033    Specimen: Culturette from Nasopharyngeal Updated: 03/09/21 2132     Purpose of COVID testing Diagnostic -[facility]     SARS-CoV-2 Specimen Source Nasal Swab     SARS CoV 2 Overall Result Not Detected     Influenza A Not Detected     Influenza B Not Detected    Narrative:      o Collect and clearly label specimen type:  o PREFERRED-Upper respiratory specimen: One Nasal Swab in  Transport Media.  o Hand deliver to laboratory ASAP  Diagnostic -[facility]    Troponin I [[id number]] Collected: 03/09/21 2033    Specimen: Blood Updated: 03/09/21 2126     Troponin I 0.02 ng/mL     Basic Metabolic Panel [[id number]]  (Abnormal) Collected: 03/09/21 2033    Specimen: Blood Updated: 03/09/21 2125     Glucose 106 mg/dL      BUN 01.0 mg/dL      Creatinine 0.8 mg/dL      Calcium 9.4 mg/dL      Sodium 272 mEq/L      Potassium 3.8 mEq/L      Chloride 94 mEq/L      CO2 25 mEq/L      Anion Gap 8.0    Lipase [[id number]] Collected: 03/09/21 2033    Specimen: Blood Updated: 03/09/21 2125     Lipase 38 U/L     Hepatic function panel (LFT) [[id number]] Collected: 03/09/21 2033    Specimen: Blood Updated: 03/09/21 2125     Bilirubin, Total 0.9 mg/dL      Bilirubin Direct 0.4 mg/dL      Bilirubin Indirect 0.5 mg/dL  AST (SGOT) 29 U/L      ALT 21 U/L      Alkaline Phosphatase 40 U/L      Protein, Total 7.5 g/dL      Albumin 4.3 g/dL      Globulin 3.2 g/dL      Albumin/Globulin Ratio 1.3    GFR [[id number]] Collected: 03/09/21 2033     Updated: 03/09/21 2125     EGFR >60.0       Lactic Acid [[id number]] Collected: 03/09/21 2033    Specimen: Blood Updated: 03/09/21 2103     Lactic Acid 1.0 mmol/L     CBC and  differential [[id number]]  (Abnormal) Collected: 03/09/21 2033    Specimen: Blood Updated: 03/09/21 2102     WBC 9.88 x10 3/uL      Hgb 12.5 g/dL      Hematocrit 08.6 %      Platelets 214 x10 3/uL      RBC 3.84 x10 6/uL      MCV 93.0 fL      MCH 32.6 pg      MCHC 35.0 g/dL      RDW 13 %      MPV 9.5 fL      Neutrophils 75.8 %      Lymphocytes Automated 14.6 %      Monocytes 8.0 %      Eosinophils Automated 1.0 %      Basophils Automated 0.3 %      Immature Granulocytes 0.3 %      Nucleated RBC 0.0 /100 WBC      Neutrophils Absolute 7.49 x10 3/uL      Lymphocytes Absolute Automated 1.44 x10 3/uL      Monocytes Absolute Automated 0.79 x10 3/uL      Eosinophils Absolute Automated 0.10 x10 3/uL      Basophils Absolute Automated 0.03 x10 3/uL      Immature Granulocytes Absolute 0.03 x10 3/uL      Absolute NRBC 0.00 x10 3/uL     Glucose Whole Blood - POCT [[id number]]  (Abnormal) Collected: 03/09/21 2021     Updated: 03/09/21 2032     Whole Blood Glucose POCT 104 mg/dL             Rads:     Results for orders placed or performed during the hospital encounter of 03/09/21   MR Angiogram Head WO Contrast    Narrative    HISTORY: Neuro deficit, acute, stroke suspected. Prior admission  03/03/2021 for left subdural hematoma and left temporal and parenchymal  hemorrhage. Now with expressive and receptive aphasia.    COMPARISON: 03/09/2021 CT head and 03/04/2021 MRI brain exams.    TECHNIQUE: MR angiogram of the head performed on a 1.5 Tesla scanner  without intravenous contrast.  3D maximum intensity projection images  were created and reviewed.     FINDINGS:  The major branches of the anterior circulation including the  intracranial carotids, middle cerebral arteries and anterior cerebral  arteries are patent.    The major branches of the posterior circulation including the  intracranial vertebral arteries, basilar artery and posterior cerebral  arteries are patent.    No aneurysm, dissection or stenosis of the circle of Willis  vasculature.      Impression    Normal MRA brain exam.    Gaylyn Rong, MD   03/10/2021 11:08 AM   CT Head WO Contrast    Narrative    HISTORY: Difficulty with word finding.  COMPARISON: Brain MRI 03/04/2021.    TECHNIQUE: Axial noncontrast imaging through the head was performed.    This CT study was performed using radiation dose reduction techniques  including one or more of the following: automated exposure control,  adjustment of the mA and/or kV according to patient size, and the use of  iterative reconstruction technique.    FINDINGS: Again seen is a left temporal parenchymal hematoma with  moderate surrounding vasogenic edema, not significantly changed.  Subdural hemorrhage extends along the cerebral falx and left tentorial  leaflet. The very thin left cerebral convexity subdural hematoma seen on  the comparison MRI is difficult to appreciate on this examination.  Ventricular size and chronic ischemic changes appear stable. Chronic  appearing nasal bone deformities are identified.      Impression     Previously visualized thin left cerebral convexity subdural  hematoma not definitely seen on this examination. The study is otherwise  without significant interval change as above.       Leandro Reasoner, MD   03/09/2021 9:35 PM   Results for orders placed or performed during the hospital encounter of 03/03/21   MRI Brain W WO Contrast    Narrative    HISTORY: Left parietotemporal mass or lesion..    COMPARISON: 03/03/2021 CT head.    TECHNIQUE: MRI of the brain performed on a 3.0 Tesla scanner without and  with 12 mL of Clariscan intravenous contrast. Examination performed per  routine brain imaging protocol.     FINDINGS:  Acute appearing parenchymal hematoma in the lateral left temporal lobe  measuring approximately 2.4 x 2.3 cm (AP by TR), likely reflecting  hemorrhagic contusion. Perilesional edema with mass effect and partial  effacement of the temporal horn of the left lateral ventricle.     Small  cortical/subcortical based foci of susceptibility in the inferior  right parietal lobe consistent with small microhemorrhages. Mild  surrounding edema.    Scattered areas of nonsuppression of sulcal FLAIR signal and sulcal  susceptibility most prominent in the parietal sulci and also in the left  frontal sulci consistent with sequela of subarachnoid hemorrhage, stable  compared to the prior CT.    Subdural hematoma along left cerebral convexity measuring maximally up  to 3 mm in thickness. Localized mass effect on the left cerebrum without  midline shift. Subdural blood products extending along the left falx and  left greater than right tentorial leaflets. There are also trace  layering blood products in the occipital horns of the lateral  ventricles.    The ventricles are within normal size limits for the degree of  background mild volume loss. Scattered periventricular and subcortical  T2 FLAIR hyperintensities in the supratentorial white matter consistent  with chronic small vessel ischemic changes. Chronic infarct with  hemosiderin staining in the bilateral cerebellar hemispheres. No  diffusion restriction to suggest recent infarct. The major intracranial  flow voids are maintained. No suspicious areas of increased  susceptibility.    Nonspecific smooth mild diffuse pachymeningeal enhancement.    Bilateral lens extractions. Otherwise, orbits are unremarkable. Trace  mucosal thickening in ethmoid air cells. Mild polypoid mucosal  thickening in the left maxillary antrum. The remaining paranasal sinuses  and the right mastoid air cells are clear. Partial left mastoid  effusion.      Impression    1. Hemorrhagic contusion with hematoma in the lateral left temporal lobe  and smaller hemorrhagic contusion in the right parietal lobe, with  surrounding edema as detailed above.  2. Additional multifocal left greater than right subdural, left  subarachnoid and intraventricular hemorrhage as detailed. No  midline  shift.    3. Mild smooth diffuse pachymeningeal enhancement. This finding could be  seen in the setting of intracranial hypotension, versus other meningeal  process. Correlate clinically, with CSF sampling if indicated.    4. Background mild global volume loss, chronic small vessel ischemic  changes and multiple chronic cerebellar infarcts.    Gaylyn Rong, MD   03/04/2021 9:55 PM       Signed by:  Ladoris Gene, FNP-BC  Nurse Practitioner  [facility]  Spectra: [facility] [phone]  [facility] [phone]    *Final recommendations per attending neurologist who will also see patient today and record notes.

## 2021-03-10 NOTE — SLP Eval Note (Signed)
[facility]   Speech and Language Therapy Evaluation     Patient: [name]    MRN#: [id number]   Unit:  [facility] TOWER 7    Assessment:   [name] is a 87 y.o. female admitted 03/09/2021 for Aphasia [R47.01] presenting with moderate receptive-expressive aphasia c/b deficits with following directions, answering yes/no questions, word finding errors/semantic paraphasias, and increased use of circumlocutory/filler speech. Deficits exacerbated by hearing loss (hearing aids not available). Relative strengths in reading comprehension/writing at the phrase/sentence level. Patient will benefit from ongoing therapy during this admission and upon d/c.     Therapy Diagnosis: Aphasia    Discharge Recommendations:   Expected disposition: Recommendations: Home speech;Home with supervision/assistance;Defer to PT/OT recommendation     Plan:   Plan:     SLP Frequency Recommended: 2-3x/wk     History of Present Illness:   Medical Diagnosis: Aphasia [R47.01]    History of Present Illness:   [name] is a 87 y.o. female visiting from [place] hx HTN, HLD, hypothyroid, irregular heartbeat, sternotomy for aortic valve replacement >10 years ago then repeat aortic valve replacement 5-6 years ago (procedure sounds like TAVR), intracerebral hemorrhage after fall 2021 [facility] [place], [place] - no residual deficit per daughter), trauma admit 8/15-8/18 for left temporal and smaller rigth temporal hemorrhagic contusion + left greater than right SDH (only right SDH is along tentorial leaflet) + IVH who presents to the hospital with expressive and receptive aphasia. She was doing well after the trauma admit until ~10:30 today when at brunch it was difficult to get her attention even when calling out her name. Later she was found sitting with her head down in her hands and again it was difficult to get her attention. When she spoke there was word finding difficulty and some  word salad. She appeared to have expressive and receptive aphasia for me. She denies headache. These symptoms are sudden onset, moderate intensity, without alleviating factors.     Her daughter also noted one episode of right hand shaking last admission making it difficult for her to use a spoon. Her daughter has noted infrequent right facial twitching which started prior to the trauma admission.     Imaging:   CT Head WO Contrast    Result Date: 03/09/2021   Previously visualized thin left cerebral convexity subdural hematoma not definitely seen on this examination. The study is otherwise without significant interval change as above.  Susan Reasoner, MD  03/09/2021 9:35 PM    CT Head without Contrast    Result Date: 03/03/2021  1. Small acute left subdural hematoma overlying the left frontal parietal lobe. 2. Ovoid hyperdense masslike nodule in the left parietotemporal lobe. Differential considerations include mass versus focal evolving intrapedicle hemorrhage. Recommend further evaluation with contrast-enhanced MRI. Findings were discussed with [name] unit on 03/03/2021 at 7:40 PM Susan Portela, MD  03/03/2021 7:59 PM    CT Cervical Spine without Contrast    Result Date: 03/03/2021  1. No acute bony abnormality of the cervical spine. 2. Straightening cervical spine may relate to position or spasm. 3. Degenerative changes of the lower cervical spine. Susan Portela, MD  03/03/2021 7:39 PM    MR Angiogram Head WO Contrast    Result Date: 03/10/2021  Normal MRA brain exam. Susan Rong, MD  03/10/2021 11:08 AM    MR Angiogram Neck W WO Contrast    Result Date: 03/10/2021  1. Patent cervical vasculature without  aneurysm or dissection. Mild narrowing in the proximal right cervical vertebral artery. 2. There is no stenosis in the proximal right cervical internal carotid artery by NASCET criteria. 3. There is no stenosis in the proximal left cervical internal carotid artery by NASCET criteria. Susan Rong, MD  03/10/2021  11:42 AM    MRI Brain WO Contrast    Result Date: 03/10/2021  1. No acute infarct. 2. Evolution and decreased size of hematoma in the lateral left temporal lobe and smaller hemorrhagic contusion in the right parietal lobe with surrounding edema as detailed. No interval hemorrhage compared to the prior 03/04/2021 MRI 3. Stable left subdural hematoma and sequela of prior subarachnoid hemorrhage as detailed above. 4. Mild global volume loss, chronic small vessel ischemic changes and multiple chronic cerebellar infarcts. Susan Rong, MD  03/10/2021 11:24 AM    MRI Brain W WO Contrast    Result Date: 03/04/2021  1. Hemorrhagic contusion with hematoma in the lateral left temporal lobe and smaller hemorrhagic contusion in the right parietal lobe, with surrounding edema as detailed above. 2. Additional multifocal left greater than right subdural, left subarachnoid and intraventricular hemorrhage as detailed. No midline shift. 3. Mild smooth diffuse pachymeningeal enhancement. This finding could be seen in the setting of intracranial hypotension, versus other meningeal process. Correlate clinically, with CSF sampling if indicated. 4. Background mild global volume loss, chronic small vessel ischemic changes and multiple chronic cerebellar infarcts. Susan Rong, MD  03/04/2021 9:55 PM       Patient Active Problem List   Diagnosis    Nontraumatic acute subdural hemorrhage    Aphasia    Cerebral edema    Left temporal and right parietal hemorrhagic contusions on MRI 03/04/21    Subdural hematoma    Hyponatremia       Past Medical/Surgical History:  Past Medical History:   Diagnosis Date    Bleeding in head following injury with loss of consciousness     a year ago    Hyperlipidemia     Hypertension     Hypothyroidism     Irregular heartbeat       Past Surgical History:   Procedure Laterality Date    AORTIC VALVE REPLACEMENT      first AVR required sternotomy, >10 years ago per patient    BACK SURGERY      HYSTERECTOMY       REPLACEMENT TOTAL KNEE Bilateral     left total, right "half knee" replacement    TRANSCATHETER AORTIC VALVE REPLACEMENT  2016    5-6 years ago per patient    WRIST SURGERY Left 2017        Social History:  Home Living Arrangements  Living Arrangements: Alone, Children    Subjective:   Patient is agreeable to participation in the therapy session. Family (son and daughter) are agreeable to patient's participation in the therapy session. Nursing clears patient for therapy. Patient's medical condition is appropriate for Speech therapy intervention at this time.    PAIN: Denies    Objective:   Observation of Patient/Vital Signs:  Patient is seated in a bedside chair with dressings in place.  Interpreter services required: N/A  Patient was wearing a mask: No    Precautions: aspiration, fall, seizure    Cognitive Status and Neuro Exam:   Orientation   -Person (+)  -Place (+)  -Situation (+)      Oral Motor Assessment: Speech is clear/fluent, 100% intelligible to unfamiliar listener  Auditory Comprehension:   -Simple y/n questions (3/5 questions with repetition)  -Simple wh questions (2/5 questions with repetition)  -Open-ended wh questions(1/3 with repetition and visual cues)  -Spontaneous speech content (inconsistently on topic, unable to complete procedural narrative task, ? Hearing loss component)  -One-step directions (1/5 with verbal/visual cues)      Reading Comprehension:   -Answered yes/no questions appropriately (3/3 trials) when presented in written form   -Followed 1-step directions in 2/2 trials when presented in written form  -Answered simple wh questions in 3/3 trials when presented in written form    Verbal Expression:   -Expression of basic needs/wants: verbal  -Spontaneous speech/response to open-ended questions: fluent, frequent semantic paraphasias for nouns, pronouns, and verbs.   -Object naming: 3/10 items independently    Written Expression:   -Able to write name and home address accurately  and legibly    Pragmatics: WFL      Behavior: alert, cooperative, follows directions with written cues        Educated the patient to role of speech therapy, plan of care, goals of therapy and discharge recommendations. Needs reinforcement. Family verbalized understanding.     Patient left with call bell within reach, all needs met, fall mat in place and all questions answered. RN notified of session outcome and patient response.    Goals:   Patient will verbally name objects in 8/10 trials with written cues over the course of 1-2 sessions.   Patient will follow 1-step directions in 4/5 trials with maximum cues over the course of 1-2 sessions.   Patient will answer simple yes/no questions in 8/10 trials with maximum cues over the course of 1-2 sessions.     Sonda Rumble, M.A., CCC-SLP  PPE Worn by Provider: gloves and face mask       Time of treatment:   SLP Received On: 03/10/21  Start Time: 1450  Stop Time: 1510  Time Calculation (min): 20 min

## 2021-03-10 NOTE — Plan of Care (Signed)
Problem: Moderate/High Fall Risk Score >5  Goal: Patient will remain free of falls  Outcome: Progressing  Flowsheets (Taken 03/10/2021 1426)  Moderate Risk (6-13): MOD-Apply bed exit alarm if patient is confused  High (Greater than 13): MOD-Remain with patient during toileting  VH Moderate Risk (6-13): YELLOW "FALL RISK" ARM BAND  VH High Risk (Greater than 13): ALL REQUIRED MODERATE INTERVENTIONS     Problem: Safety  Goal: Patient will be free from injury during hospitalization  Outcome: Progressing  Flowsheets (Taken 03/10/2021 1426)  Patient will be free from injury during hospitalization:   Assess patient's risk for falls and implement fall prevention plan of care per policy   Provide and maintain safe environment   Use appropriate transfer methods   Ensure appropriate safety devices are available at the bedside   Include patient/ family/ care giver in decisions related to safety   Hourly rounding   Assess for patients risk for elopement and implement Elopement Risk Plan per policy   Provide alternative method of communication if needed (communication boards, writing)  Goal: Patient will be free from infection during hospitalization  Outcome: Progressing     Problem: Pain  Goal: Pain at adequate level as identified by patient  Outcome: Progressing     Problem: Side Effects from Pain Analgesia  Goal: Patient will experience minimal side effects of analgesic therapy  Outcome: Progressing     Problem: Discharge Barriers  Goal: Patient will be discharged home or other facility with appropriate resources  Outcome: Progressing     Problem: Psychosocial and Spiritual Needs  Goal: Demonstrates ability to cope with hospitalization/illness  Outcome: Progressing     Problem: Compromised Tissue integrity  Goal: Damaged tissue is healing and protected  Outcome: Progressing  Flowsheets (Taken 03/10/2021 1426)  Damaged tissue is healing and protected:   Provide wound care per wound care algorithm   Monitor/assess Braden scale  every shift  Goal: Nutritional status is improving  Outcome: Progressing

## 2021-03-10 NOTE — Progress Notes (Addendum)
NURSING PROGRESS NOTE    [name] is a 87 y.o. female  Admitted 03/09/2021  8:28 PM [facility] day 0)    Indication for Neuro [facility] Status:   Neuro check q 4 hrs  NIHSS q shift    Major Shift Events:   Pt's SBP was in 170s after the admission, which was resolved with PRN Hydralazine x 1.    Pt's daughter, [name], stayed overnight at bedside. Per daughter, the pt is from [place] but has been staying with her over the summer.    Review of Systems  Neuro:  A&O x 3, disoriented to situation. However, pt knows she is here for "issue with the brain"  Slurred speech, expressive aphasia  Follow commands  Pupils 3 mm, R & B  Refer to flowsheet for details    Cardiac:  SR  Pulses palpable  Per instruction for PRN Hydralazine, SBP < 160    Respiratory:  RA  Lung sounds clear    GI/GU:  Regular diet  Walks to the bathroom with assist    Skin:  Refer to flowsheet for details    BM this shift? N    Recent Labs   Lab 03/10/21  0353   Sodium 128*   Potassium 3.5   Chloride 97*   CO2 24   BUN 10.0   Creatinine 0.7   EGFR >60.0   Glucose 131*   Calcium 9.0       Recent Labs   Lab 03/10/21  0353   WBC 8.08   Hgb 12.2   Hematocrit 34.8   Platelets 190       LDAs  Patient Lines/Drains/Airways Status       Active Lines, Drains and Airways       Name Placement date Placement time Site Days    Peripheral IV 03/09/21 18 G Standard Left Antecubital 03/09/21  2019  Antecubital  less than 1                Safety Checklist    Fall Precautions Y    Avasys N    Seizure Precautions N    Aspiration Precautions Y   Belongings Checked Y     Service Essentials  Medication Teaching Y   Purposeful Hourly Rounding Y

## 2021-03-10 NOTE — PT Eval Note (Signed)
[facility]   Physical Therapy Evaluation   Patient:  [name] MRN#:  [id number]  Unit: [facility] TOWER 7  Bed: F725/F725.01    Post Acute Care Therapy Recommendations:   Discharge Recommendations: Home with supervision (initial hands on assist with mobiltiy)     Milestones to be reached to achieve recommendation: none  Anticipate achievement in n/a sessions    DME Recommended for Discharge: Shower chair    If Home with supervision (initial hands on assist with mobiltiy) recommended discharge disposition is not available, patient will need rehab.     Therapy discharge recommendations may change with patient status.  Please refer to most recent note for up-to-date recommendations.    Assessment:   Significant Findings: none     [name] is a 87 y.o. female admitted 03/09/2021 with expressive > receptive language limitations - recent fall. Patient presents with functional limitations below impairing ease and independence with mobility - ongoing expressive > receptive language limitations. Pt currently requires CGA/SBA for transfers and ambulation in the room, min increased instability with directional changes with pt ambulating at a rapid cadence throughout. VC for decreased gait sequence primarily with turns with pt verbalizing understanding - intermittent repetition required.  No c/o increased pain / malaise throughout. Pt will continue to benefit from acute PT services to maximize safety and independence with mobility prior to discharge.     Functional Limitations: Assessment: Gait impairment, Decreased balance, Decreased functional mobility  Prognosis: Good  Risks/Benefits/POC Discussed with Pt/Family: With patient/family    Rehab tech present: none, PPE worn: n/a    Therapy Diagnosis: Decreased functional mobility.     Rehabilitation Potential: Good    Treatment Activities:   PT Evaluation  and Gait training     Educated the patient to role of physical therapy,  plan of care, goals of therapy and safety with mobility and ADLs.    Plan:   Treatment/Interventions: Gait training, Neuromuscular re-education, Functional transfer training, Patient/family training, Equipment eval/education, Continued evaluation, Exercise    PT Frequency: 3-4x/wk    Continue plan of care.       Precautions and Contraindications:   Falls  Seizures   HOH    Consult received for [name] for PT Evaluation and Treatment.  Patient's medical condition is appropriate for Physical Therapy intervention at this time.    Risks and benefits of PT discussed with: pt / son     Patient Goal: Get better    History of Present Illness:   [name] is a 87 y.o. female admitted on 03/09/2021 with "hx HTN, HLD, hypothyroid, irregular heartbeat, sternotomy for aortic valve replacement >10 years ago then repeat aortic valve replacement 5-6 years ago (procedure sounds like TAVR), intracerebral hemorrhage after fall 2021 [facility] [place], [place] - no residual deficit per daughter), trauma admit 8/15-8/18 for left temporal and smaller rigth temporal hemorrhagic contusion + left greater than right SDH (only right SDH is along tentorial leaflet) + IVH who presents to the hospital with expressive and receptive aphasia. She was doing well after the trauma admit until ~10:30 today when at brunch it was difficult to get her attention even when calling out her name. Later she was found sitting with her head down in her hands and again it was difficult to get her attention. When she spoke there was word finding difficulty and some word salad. She appeared to have expressive and receptive aphasia for me. She denies  headache. These symptoms are sudden onset, moderate intensity, without alleviating factors.     Her daughter also noted one episode of right hand shaking last admission making it difficult for her to use a spoon. Her daughter has noted infrequent right facial twitching which started  prior to the trauma admission. " per H&P    Medical Diagnosis: Aphasia [R47.01]    Past Medical/Surgical History:  Past Medical History:   Diagnosis Date    Bleeding in head following injury with loss of consciousness     a year ago    Hyperlipidemia     Hypertension     Hypothyroidism     Irregular heartbeat      Past Surgical History:   Procedure Laterality Date    AORTIC VALVE REPLACEMENT      first AVR required sternotomy, >10 years ago per patient    BACK SURGERY      HYSTERECTOMY      REPLACEMENT TOTAL KNEE Bilateral     left total, right "half knee" replacement    TRANSCATHETER AORTIC VALVE REPLACEMENT  2016    5-6 years ago per patient    WRIST SURGERY Left 2017       Imaging/Tests/Labs:  CT Head WO Contrast    Result Date: 03/09/2021   Previously visualized thin left cerebral convexity subdural hematoma not definitely seen on this examination. The study is otherwise without significant interval change as above.  Leandro Reasoner, MD  03/09/2021 9:35 PM    CT Head without Contrast    Result Date: 03/03/2021  1. Small acute left subdural hematoma overlying the left frontal parietal lobe. 2. Ovoid hyperdense masslike nodule in the left parietotemporal lobe. Differential considerations include mass versus focal evolving intrapedicle hemorrhage. Recommend further evaluation with contrast-enhanced MRI. Findings were discussed with Delorise Shiner unit on 03/03/2021 at 7:40 PM Wyatt Portela, MD  03/03/2021 7:59 PM    CT Cervical Spine without Contrast    Result Date: 03/03/2021  1. No acute bony abnormality of the cervical spine. 2. Straightening cervical spine may relate to position or spasm. 3. Degenerative changes of the lower cervical spine. Wyatt Portela, MD  03/03/2021 7:39 PM    MRI Brain W WO Contrast    Result Date: 03/04/2021  1. Hemorrhagic contusion with hematoma in the lateral left temporal lobe and smaller hemorrhagic contusion in the right parietal lobe, with surrounding edema as detailed above. 2. Additional multifocal  left greater than right subdural, left subarachnoid and intraventricular hemorrhage as detailed. No midline shift. 3. Mild smooth diffuse pachymeningeal enhancement. This finding could be seen in the setting of intracranial hypotension, versus other meningeal process. Correlate clinically, with CSF sampling if indicated. 4. Background mild global volume loss, chronic small vessel ischemic changes and multiple chronic cerebellar infarcts. Gaylyn Rong, MD  03/04/2021 9:55 PM     Social History:   Prior Level of Function:  Prior Level of Function: Ambulates Independently and Assistance with ADLs  Baseline Activity: Special educational needs teacher   DME Currently at Home: Single point cane , Rolling walker , Wheelchair - manual , and Grab bars    Home Living Arrangements:  Home Living Arrangements: Daughter   Type of Home: House  Home Layout: 1 level set up with, no stairs to enter   Home Living Notes / Comments: Daughter able to assist on PRN basis per pt and son     Subjective:    "You're very good"  Patient's medical condition is appropriate for Physical Therapy  intervention at this time.  Patient is agreeable to participation in the therapy session. Nursing clears patient for therapy.    Pain Assessment: no/denies pain     Objective:   Patient is in bed with PIV access in place.    Cognition  Arousal/alertness: Appropriate response to stimuli  Attention span: Intermittently distracted   Orientation level: Oriented x 3  Following commands: Follows all commands and directions with repetition   Safety awareness: Minimal verbal instruction  Insight: Educated in safety awareness  Behavior: Calm  and Cooperative   Coordination: Grossly intact   Vision / oculomotor: intact.   - Expressive > receptive language limitations     Sensory  BLE light touch: Intact, denies paresthesias      Musculoskeletal Examination  Gross ROM  Right Upper Extremity ROM: WFL  Left Upper Extremity ROM: WFL  Right Lower Extremity ROM: WFL  Left  Lower Extremity ROM: WFL     Gross Strength   Right Upper extremity: Min proximal weakness, WFL  Left Upper Extremity: WFL  Right Lower Extremity: WFL  Left Lower Extremity: [facility]    Functional Mobility  Sit to supine: Stand by Assist   Sit to stand: Stand by Assist with No AD   Stand to sit: Stand by Assist with No AD        PMP - Progressive Mobility Protocol   PMP Activity: Step 6 - Walks in Room  Distance Walked (ft) (Step 6,7): 40 Feet     Ambulation  Distance: 40 ft   Device: No AD   Assist level: Stand by Assist / Contact Guard   Pattern: step through  and rapid sequence with min instability with directional changes - no LOB  Stair management: NT  Cueing required: VC/TC for sequencing     Balance  Static sitting: Good  Dynamic sitting: Good  Static Standing: Good  Dynamic standing: Good-      Participation and Activity Tolerance  Participation effort: Good  Patient endurance: Good    Patient left in bed with son and RN present with call bell within reach, all needs met and all questions answered.  SCDs off as found, fall mat in place, bed alarm not activating - RN informed, chair alarm n/a   RN notified of session outcome and patient response.     PPE worn by therapist: gloves, surgical mask, goggles   PPE worn by pt: surgical mask       Goals  Goal Formulation: With patient/family  Time for Goal Acheivement: 5 visits  Goals: Select goal  Pt Will Go Supine To Sit: with supervision, to maximize functional mobility and independence  Pt Will Perform Sit to Stand: with supervision, to maximize functional mobility and independence  Pt Will Ambulate: 101-150 feet, with stand by assist, to maximize functional mobility and independence    Time of Treatment:  PT Received On: 03/10/21  Start Time: 1000  Stop Time: 1030  Time Calculation (min): 30 min    Torrie Mayers, DPT, [phone]

## 2021-03-10 NOTE — OT Eval Note (Signed)
[facility]   Occupational Therapy Evaluation   Patient: [name]    MRN#: [id number]   Unit: [facility] TOWER 7  Bed: F725/F725.01                                   Post Acute Care Therapy Recommendations:   Discharge Recommendations: Home with supervision     Milestones to be reached to achieve recommendation: none  Anticipate achievement in n/a sessions    DME Recommended for Discharge: Patient already has needed equipment      Therapy discharge recommendations may change with patient status.  Please refer to most recent note for up-to-date recommendations.    Assessment:   Significant Findings: None    [name] is a 87 y.o. female admitted 03/09/2021. Patient presents with decreased safety awareness, balance deficits, aphasia (expressive>receptive) and decreased ADL independence. Pt currently requires SBA/CGA for functional transfers and mobility without use of AD, noted to be slightly impulsive and requiring verbal cues for redirection/reorientation to task. Pt with some impaired command following as well.  Pt will benefit from ongoing skilled OT to maximize functional independence and address above deficits.      Therapy Diagnosis: decreased ADL independence    Rehabilitation Potential: good    Treatment Activities: Evaluation, education, ADL, functional mobility  Educated the patient to role of occupational therapy, plan of care, goals of therapy and safety with mobility and ADLs, home safety.    Plan:   OT Frequency Recommended: 2-3x/wk     Treatment/Interventions: ADL retraining, functional transfers/mobility, strengthening, balance training, activity tolerance, education, safety training    Risks/benefits/POC discussed with patient and children        Precautions and Contraindications:   Falls    Consult received for [name] for OT Evaluation and Treatment.  Patients medical condition is appropriate for Occupational Therapy intervention at  this time.      History of Present Illness:    [name] is a 87 y.o. female admitted on 03/09/2021 with aphasia.    Admitting Diagnosis: Aphasia [R47.01]    Past Medical/Surgical History:  Past Medical History:   Diagnosis Date    Bleeding in head following injury with loss of consciousness     a year ago    Hyperlipidemia     Hypertension     Hypothyroidism     Irregular heartbeat      Past Surgical History:   Procedure Laterality Date    AORTIC VALVE REPLACEMENT      first AVR required sternotomy, >10 years ago per patient    BACK SURGERY      HYSTERECTOMY      REPLACEMENT TOTAL KNEE Bilateral     left total, right "half knee" replacement    TRANSCATHETER AORTIC VALVE REPLACEMENT  2016    5-6 years ago per patient    WRIST SURGERY Left 2017       Imaging/Tests/Labs:  CT Head WO Contrast    Result Date: 03/09/2021   Previously visualized thin left cerebral convexity subdural hematoma not definitely seen on this examination. The study is otherwise without significant interval change as above.  Leandro Reasoner, MD  03/09/2021 9:35 PM    CT Head without Contrast    Result Date: 03/03/2021  1. Small acute left subdural hematoma overlying the left frontal parietal lobe. 2. Ovoid hyperdense masslike nodule in the left  parietotemporal lobe. Differential considerations include mass versus focal evolving intrapedicle hemorrhage. Recommend further evaluation with contrast-enhanced MRI. Findings were discussed with Delorise Shiner unit on 03/03/2021 at 7:40 PM Wyatt Portela, MD  03/03/2021 7:59 PM    CT Cervical Spine without Contrast    Result Date: 03/03/2021  1. No acute bony abnormality of the cervical spine. 2. Straightening cervical spine may relate to position or spasm. 3. Degenerative changes of the lower cervical spine. Wyatt Portela, MD  03/03/2021 7:39 PM    MR Angiogram Head WO Contrast    Result Date: 03/10/2021  Normal MRA brain exam. Gaylyn Rong, MD  03/10/2021 11:08 AM    MR Angiogram Neck W WO Contrast    Result  Date: 03/10/2021  1. Patent cervical vasculature without aneurysm or dissection. Mild narrowing in the proximal right cervical vertebral artery. 2. There is no stenosis in the proximal right cervical internal carotid artery by NASCET criteria. 3. There is no stenosis in the proximal left cervical internal carotid artery by NASCET criteria. Gaylyn Rong, MD  03/10/2021 11:42 AM    MRI Brain WO Contrast    Result Date: 03/10/2021  1. No acute infarct. 2. Evolution and decreased size of hematoma in the lateral left temporal lobe and smaller hemorrhagic contusion in the right parietal lobe with surrounding edema as detailed. No interval hemorrhage compared to the prior 03/04/2021 MRI 3. Stable left subdural hematoma and sequela of prior subarachnoid hemorrhage as detailed above. 4. Mild global volume loss, chronic small vessel ischemic changes and multiple chronic cerebellar infarcts. Gaylyn Rong, MD  03/10/2021 11:24 AM    MRI Brain W WO Contrast    Result Date: 03/04/2021  1. Hemorrhagic contusion with hematoma in the lateral left temporal lobe and smaller hemorrhagic contusion in the right parietal lobe, with surrounding edema as detailed above. 2. Additional multifocal left greater than right subdural, left subarachnoid and intraventricular hemorrhage as detailed. No midline shift. 3. Mild smooth diffuse pachymeningeal enhancement. This finding could be seen in the setting of intracranial hypotension, versus other meningeal process. Correlate clinically, with CSF sampling if indicated. 4. Background mild global volume loss, chronic small vessel ischemic changes and multiple chronic cerebellar infarcts. Gaylyn Rong, MD  03/04/2021 9:55 PM     Lab Results   Component Value Date/Time    HGB 12.2 03/10/2021 03:53 AM    HCT 34.8 03/10/2021 03:53 AM    K 3.5 03/10/2021 03:53 AM    NA 128 (L) 03/10/2021 03:53 AM    INR 1.0 03/10/2021 03:53 AM    TROPI 0.02 03/09/2021 08:33 PM    TROPI <0.01 03/03/2021 07:03  PM       Social History:   Prior Level of Function: independent ADLs, ambulates with no AD, though family states they have been trying to get her to use a walker more consistently  Assistive Devices: RW, shower chair, elevated toilet seat   Baseline Activity: household/limited community distances  DME Currently at Home: as above   Home Living Arrangements: with daughter   Type of Home: house  Home Layout: no STE, stays on one level     Subjective: "Alright, that's very good"    Patient is agreeable to participation in the therapy session. Family and/or guardian are agreeable to patient's participation in the therapy session. Nursing clears patient for therapy.     Patient Goal: to get better   Pain:   Denies     Objective:   Patient is seated  in a bedside chair with telemetry in place.  Pt wore mask during therapy session:No      Cognitive Status and Neuro Exam:  A/Ox3  Answers questions appropriately: ~75% of the time   Follows commands      Musculoskeletal Examination  RUE ROM: WFL  LUE ROM: WFL  RLE ROM: WFL  LLE ROM: WFL    RUE Strength: grossly 4/5  LUE Strength: grossly 4/5  RLE Strength: WFL  LLE Strength: WFL    Coordination  Digit opposition: intact  FTN: intact  RAM: intact    Sensory  Auditory: HOH, wears hearing aids   Tactile: intact to light touch BUE/LE; denies paraesthesias     Vision  Tracking: intact; +nystagmus at end range tracking to L   EOM: intact  Visual fields: intact  Saccades: intact  Other visual impairment: wears glasses     Activities of Daily Living  Eating: independent  Grooming: supervision at sink  Bathing: SBA from seated   UE Dressing: independent  LE Dressing: SBA  Toileting: supervision    Functional Mobility:  Supine to Sit: supervision  Sit to Stand: SBA  Transfers: SBA/CGA  Functional Mobility: SBA/CGA without AD     PMP Activity: Step 6 - Walks in Room      Balance  Static Sitting: good  Dynamic Sitting: good  Static Standing: good-  Dynamic Standing: fair+    Participation  and Activity Tolerance  Participation Effort: good  Endurance: good    Patient left with call bell within reach, all needs met, SCDs off as found, fall mat in place, bed alarm n/a, chair alarm on and all questions answered. RN notified of session outcome and patient response.           PPE worn during session: procedural mask and gloves    Tech present: n/a  PPE worn by tech: N/A        Enid Derry, OTR/L  Pager [phone]         Time of treatment:   OT Received On: 03/10/21  Start Time: 1415  Stop Time: 1445  Time Calculation (min): 30 min

## 2021-03-10 NOTE — Procedures (Signed)
Continuous Video-EEG Report    Patients name: [name]   Patients date of birth: 1928/07/20     Date of study: 03/10/21 1717 - 03/10/21 1814    REASON FOR STUDY: eval for seizure; aphasia    HISTORY: Patient is a 87 y.o. female with history of HTN, HLD, prior fall c/b ICH (2020), trauma admit 8/15-8/18 for left temporal and smaller rigth temporal hemorrhagic contusion + left greater than right SDH (only right SDH is along tentorial leaflet) + IVH who now presents to the hospital with expressive and receptive aphasia concerning for seizure.    INTRODUCTION: A Video EEG was performed using the standard international 10/20 Electrode Placement system with the following additional electrode(s): EKG    STATE: awake, asleep  MEDICATIONS: brivaracetam  TECHNICAL PROBLEMS: Excessive muscle artifact    DESCRIPTION OF THE RECORD:    Background:  Predominant frequencies: alpha, beta in the right hemisphere; alpha/beta with intermixed theta/delta on the left as below  Posterior dominant rhythm: 9 Hz   Symmetry: asymmetric due to F7/T3 maximal focal delta slowing  Continuity: continuous  Voltage: normal  Variability: present  Reactivity: present  State changes: present  Sleep: stage 1, 2    Periodic Discharges or Rhythmic patterns: Occasional, 1-1.5 Hz, left temporal intermittent rhythmic delta activity (TIRDA), F7/T3 maximal    Sporadic Epileptiform Discharges: none    Seizures: none    IMPRESSION:   Abnormal EEG due to:   1. Occasional left temporal intermittent rhythmic delta activity (TIRDA)     CLINICAL CORRELATION:  This EEG shows focal cerebral dysfunction in the left anterior to midtemporal region, which is consistent with the known underlying structural abnormality. 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 No electrographic seizures were seen.    L slower:    L TIRDA        Asleep    Diannia Ruder,  MD  [facility]  Board Certified in Adult Neurology, Epilepsy, and Clinical Neurophysiology by the ABPN

## 2021-03-10 NOTE — Progress Notes (Signed)
Per pt's daughter, [name], pt had her first syncopal episode back in 2017, where she "passed out on top of the stairs" and broke her wrist. Pt has no recollection of the event. This past Friday, pt was reports falling and hitting her head, but denies syncope, just "losing balance". Yesterday evening, pt was getting up from the den of the house to walk to the bathroom, when daughter heard a thud, hitting her R head and hip. Pt has no recollection of this event. Also, as per daughter, pt has been using wrong pronouns (using "she" instead of "he") and calling by the wrong name. Pt also has had couple staring spells and "absent-ness" not responding to verbal stimuli of family members and not following commands. Pt has no recollection of these events of "zoning out" and denies it happening.

## 2021-03-11 DIAGNOSIS — R9401 Abnormal electroencephalogram [EEG]: Secondary | ICD-10-CM

## 2021-03-11 LAB — CBC
Absolute NRBC: 0 10*3/uL (ref 0.00–0.00)
Hematocrit: 32.3 % — ABNORMAL LOW (ref 34.7–43.7)
Hgb: 11.2 g/dL — ABNORMAL LOW (ref 11.4–14.8)
MCH: 33.3 pg (ref 25.1–33.5)
MCHC: 34.7 g/dL (ref 31.5–35.8)
MCV: 96.1 fL — ABNORMAL HIGH (ref 78.0–96.0)
MPV: 9.4 fL (ref 8.9–12.5)
Nucleated RBC: 0 /100 WBC (ref 0.0–0.0)
Platelets: 186 10*3/uL (ref 142–346)
RBC: 3.36 10*6/uL — ABNORMAL LOW (ref 3.90–5.10)
RDW: 13 % (ref 11–15)
WBC: 7.5 10*3/uL (ref 3.10–9.50)

## 2021-03-11 LAB — BASIC METABOLIC PANEL
Anion Gap: 6 (ref 5.0–15.0)
BUN: 13 mg/dL (ref 7.0–19.0)
CO2: 22 mEq/L (ref 22–29)
Calcium: 8.5 mg/dL (ref 7.9–10.2)
Chloride: 100 mEq/L (ref 100–111)
Creatinine: 0.7 mg/dL (ref 0.6–1.0)
Glucose: 88 mg/dL (ref 70–100)
Potassium: 3.9 mEq/L (ref 3.5–5.1)
Sodium: 128 mEq/L — ABNORMAL LOW (ref 136–145)

## 2021-03-11 LAB — ECG 12-LEAD
Atrial Rate: 85 {beats}/min
P Axis: 86 degrees
P-R Interval: 136 ms
Q-T Interval: 404 ms
QRS Duration: 106 ms
QTC Calculation (Bezet): 480 ms
R Axis: 2 degrees
T Axis: 33 degrees
Ventricular Rate: 85 {beats}/min

## 2021-03-11 LAB — GFR: EGFR: >60

## 2021-03-11 LAB — TSH: TSH: 1.08 u[IU]/mL (ref 0.35–4.94)

## 2021-03-11 LAB — OSMOLALITY: Osmolality: 275 mosm/kg — ABNORMAL LOW (ref 280–300)

## 2021-03-11 LAB — URIC ACID: Uric acid: 3.7 mg/dL (ref 2.6–6.0)

## 2021-03-11 LAB — LACTIC ACID, PLASMA: Lactic Acid: 1 mmol/L (ref 0.2–2.0)

## 2021-03-11 MED ORDER — SODIUM CHLORIDE 0.9 % IV BOLUS
500.0000 mL | INTRAVENOUS | Status: DC | PRN
Start: 2021-03-11 — End: 2021-03-12

## 2021-03-11 MED ORDER — SODIUM CHLORIDE 1 G PO TABS
1.0000 g | ORAL_TABLET | Freq: Three times a day (TID) | ORAL | Status: DC
Start: 2021-03-12 — End: 2021-03-12
  Administered 2021-03-12 (×2): 1 g via ORAL
  Filled 2021-03-11 (×2): qty 1

## 2021-03-11 MED ORDER — SODIUM CHLORIDE 0.9 % IV BOLUS
500.0000 mL | INTRAVENOUS | Status: AC | PRN
Start: 2021-03-11 — End: 2021-03-11
  Administered 2021-03-11 (×2): 500 mL via INTRAVENOUS

## 2021-03-11 MED ORDER — SODIUM CHLORIDE 0.9 % IV BOLUS
500.0000 mL | Freq: Once | INTRAVENOUS | Status: DC
Start: 2021-03-11 — End: 2021-03-11

## 2021-03-11 MED ORDER — HYDRALAZINE HCL 20 MG/ML IJ SOLN
5.0000 mg | INTRAMUSCULAR | Status: DC | PRN
Start: 2021-03-11 — End: 2021-03-12

## 2021-03-11 MED ORDER — SODIUM CHLORIDE 1 G PO TABS
2.0000 g | ORAL_TABLET | Freq: Three times a day (TID) | ORAL | Status: AC
Start: 2021-03-11 — End: 2021-03-11
  Administered 2021-03-11 (×2): 2 g via ORAL
  Filled 2021-03-11 (×2): qty 2

## 2021-03-11 NOTE — Plan of Care (Signed)
Problem: Moderate/High Fall Risk Score >5  Goal: Patient will remain free of falls  Outcome: Progressing  Flowsheets  Taken 03/11/2021 0703  Moderate Risk (6-13):   MOD-Floor mat at bedside (where available) if appropriate   MOD-Apply bed exit alarm if patient is confused  Taken 03/10/2021 1426  VH Moderate Risk (6-13): YELLOW "FALL RISK" ARM BAND     Problem: Safety  Goal: Patient will be free from injury during hospitalization  Outcome: Progressing  Flowsheets (Taken 03/10/2021 1426)  Patient will be free from injury during hospitalization:   Assess patient's risk for falls and implement fall prevention plan of care per policy   Provide and maintain safe environment   Use appropriate transfer methods   Ensure appropriate safety devices are available at the bedside   Include patient/ family/ care giver in decisions related to safety   Hourly rounding   Assess for patients risk for elopement and implement Elopement Risk Plan per policy   Provide alternative method of communication if needed (communication boards, writing)  Goal: Patient will be free from infection during hospitalization  Outcome: Progressing     Problem: Pain  Goal: Pain at adequate level as identified by patient  Outcome: Progressing     Problem: Side Effects from Pain Analgesia  Goal: Patient will experience minimal side effects of analgesic therapy  Outcome: Progressing     Problem: Discharge Barriers  Goal: Patient will be discharged home or other facility with appropriate resources  Outcome: Progressing     Problem: Psychosocial and Spiritual Needs  Goal: Demonstrates ability to cope with hospitalization/illness  Outcome: Progressing     Problem: Compromised Tissue integrity  Goal: Damaged tissue is healing and protected  Outcome: Progressing  Goal: Nutritional status is improving  Outcome: Progressing

## 2021-03-11 NOTE — UM Notes (Signed)
03/11/21 1401  Admit to Inpatient  Once        Diagnosis: Aphasia    Level of Care: Acute    Patient Class: Inpatient            Upgrade to inpatient status on 8/23 from observation services for cont mgmt of care.    CC: Aphasia  Assessment:   Expressive > receptive aphasia in a 87 y/o female with a PMH significant for HTN, HLD, hypothyroisism, prior fall c/b ICH (2020). MRI this admission reveals decreased size of L temporal lobe hematoma and smaller hemorrhagic contusion in the R  parietal lobe; stable L SDH; mild global volume loss. 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 Etiology for her aphasia concerning for seizures. Aphasia improved since starting AEDs         Patient Active Problem List   Diagnosis    Nontraumatic acute subdural hemorrhage    Aphasia    Cerebral edema    Left temporal and right parietal hemorrhagic contusions on MRI 03/04/21    Subdural hematoma    Hyponatremia     Plan:   -vEEG report reviewed. Ok to d/c today   -Continue briviact 100 mg BID. Continue this upon discharge, can determine duration as outpatient   -No AC/antiplatelets/NSAIDs  -Seizure precautions to prevent injury during a convulsive episode:   - Patient bed in the lowest position with side rails up and padded  - Suction and oxygen equipment must be available at the patient's bedside  - Ensure IV access established  - Avoid situations that could precipitate a seizure (blinking lights, fatigue).  - Ativan 1-2 mg IV prn seizure lasting 5 minutes or 2 within 30 minutes without interval return to baseline mental status   -PT/OT/Speech         UTILIZATION REVIEW CONTACT: Donnamarie Poag RN BSN  Utilization Review  [facility]  [address]  [address], [place], [place] [address]  NPI: [id number]  Tax ID: [id number]  [place]: [phone]  Fax: [phone]      NOTES TO REVIEWER:      This clinical review is based on/compiled from documentation provided by the treatment team within the patient's medical record.

## 2021-03-11 NOTE — Consults (Signed)
Medicine Student Note --[facility] CONSULT  [id number]      Date Time: 03/11/21 11:21 AM  Patient Name: [name]  Requesting Physician: Andrew Au, MD  Consulting Physician: Gertie Exon, MD    Primary Care Physician: Patsy Lager, MD    Reason for Consultation: hyponatremia       Assessment:     Patient Active Problem List   Diagnosis    Nontraumatic acute subdural hemorrhage    Aphasia    Cerebral edema    Left temporal and right parietal hemorrhagic contusions on MRI 03/04/21    Subdural hematoma    Hyponatremia       [name] is a 87 YOF,presenting with acute onset aphasia in setting of recent trauma and left temportal/right parietal hemorrhagic contusions 8/16, found to have moderate, symptomatic hyponatremia.     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 female with pmhx significant for HTN, HLD, recent admission for trauma 8/15-8/18 with L temporal and R parietal hemorrhagic contusions after a fall, who presented to the hospital 8/23 with new, sudden onset aphasia. Patient states she does not remember event, or dizziness before event. Per family she was unable to speak and was not able to comprehend them starting at 10:30 AM 8/23.    States  she has been told by cardiologist in past to have no salt in diet and has been avoiding it for many years. Denies LE swelling, SOB, CP, dizziness, WL.       ED course: patient found to have LE and WBC on UA, hyponatremia to 127, CTH without contrast negative for new hemorrhage.    Past Medical History:     Past Medical History:   Diagnosis Date    Bleeding in head following injury with loss of consciousness     a year ago    Hyperlipidemia     Hypertension     Hypothyroidism     Irregular heartbeat        Available old records reviewed, including:      Past Surgical History:     Past Surgical History:   Procedure Laterality Date    AORTIC VALVE REPLACEMENT      first AVR required sternotomy, >10 years ago per patient    BACK SURGERY      HYSTERECTOMY      REPLACEMENT TOTAL KNEE Bilateral     left total, right "half knee" replacement    TRANSCATHETER AORTIC VALVE REPLACEMENT  2016    5-6 years ago per patient    WRIST SURGERY Left 2017       Family History:     Family History   Problem  Relation Age of Onset    Stroke Neg Hx     Intracerebral hemorrhage Neg Hx        Social History:     Social History     Socioeconomic History    Marital status: Unknown     Spouse name: Not on file    Number of children: Not on file    Years of education: Not on file    Highest education level: Not on file   Occupational History    Not on file   Tobacco Use    Smoking status: Never    Smokeless tobacco: Never   Vaping Use    Vaping Use: Never used   Substance and Sexual Activity    Alcohol use: Yes     Alcohol/week: 3.0 standard drinks     Types: 3 Glasses of wine per week     Comment: 1-2 a week    Drug use: Never    Sexual activity: Not on file   Other Topics Concern    Not on file   Social History Narrative    Not on file     Social Determinants of Health     Financial Resource Strain: Low Risk     Difficulty of Paying Living Expenses: Not hard at all   Food Insecurity: No Food Insecurity    Worried About Programme researcher, broadcasting/film/video in the  Last Year: Never true    Ran Out of Food in the Last Year: Never true   Transportation Needs: No Transportation Needs    Lack of Transportation (Medical): No    Lack of Transportation (Non-Medical): No   Physical Activity: Not on file   Stress: No Stress Concern Present    Feeling of Stress : Not at all   Social Connections: Moderately Integrated    Frequency of Communication with Friends and Family: More than three times a week    Frequency of Social Gatherings with Friends and Family: More than three times a week    Attends Religious Services: More than 4 times per year    Active Member of [employer] or Organizations: Yes    Attends Banker Meetings: More than 4 times per year    Marital Status: Widowed   Catering manager Violence: Not At Risk    Fear of Current or Ex-Partner: No    Emotionally Abused: No    Physically Abused: No    Sexually Abused: No   Housing Stability: Unknown    Unable to Pay for Housing in the Last Year: No    Number of Places Lived in the Last Year: Not on file    Unstable Housing in the Last Year: No       Allergies:     Allergies   Allergen Reactions    Codeine Nausea And Vomiting       Medications:     Medications Prior to Admission   Medication Sig    amLODIPine (NORVASC) 5 MG tablet 5 mg Takes 0.5 tab of 10mg  tab. Total of 5mg  taken per night.    digoxin (LANOXIN) 0.125 MG tablet Take 125 mcg by mouth Once daily every Sunday, Tuesday, Thursday and Saturday evening    digoxin (LANOXIN) 0.25 MG tablet Take 250 mcg by mouth Once every Monday, Wednesday and Friday morning    levothyroxine (SYNTHROID) 88 MCG tablet Take 88 mcg by mouth every morning    lisinopril (ZESTRIL) 10 MG tablet TAKE 1 TABLET ORALLY ONCE A DAY  EVERY MORNING FOR 30 DAY(S)    raloxifene (EVISTA) 60 MG tablet Take 60 mg by mouth daily    rosuvastatin (CRESTOR) 20 MG tablet Take 20 mg by mouth nightly    acetaminophen (TYLENOL) 325 MG tablet Take 2 tablets (650 mg total) by mouth every 6 (six) hours    Fungoid  Tincture 2 % Solution APPLY TWICE A DAY TO TOE NAIL    meloxicam (MOBIC) 7.5 MG tablet Take 7.5 mg by mouth daily    urea (CARMOL) 40 % cream APPLY 1 GRAM ONCE A DAY TO NAILS AND CALLUSES        Scheduled Meds: PRN Meds:    amLODIPine, 2.5 mg, Oral, QHS  amLODIPine, 5 mg, Oral, Daily  brivaracetam, 100 mg, Oral, Q12H SCH  cefuroxime, 250 mg, Oral, Q12H SCH  digoxin, 0.125 mg, Oral, Once per day on Sun Tue Thu Sat  digoxin, 0.25 mg, Oral, Q Mon/Wed/Fri (AM)  lactobacillus/streptococcus, 1 capsule, Oral, Daily  levothyroxine, 88 mcg, Oral, Daily  lisinopril, 10 mg, Oral, Daily  raloxifene, 60 mg, Oral, Daily  rosuvastatin, 20 mg, Oral, QHS  [START ON 03/12/2021] sodium chloride, 1 g, Oral, TID MEALS  sodium chloride, 2 g, Oral, TID MEALS          Continuous Infusions:   acetaminophen, 650 mg, Q6H PRN  famotidine, 20 mg, Daily PRN  hydrALAZINE, 10 mg, Q2H PRN  labetalol, 10 mg, Q2H PRN  midazolam, 2 mg, Q10 Min PRN  ondansetron, 4 mg, Q6H PRN   Or  ondansetron, 4 mg, Q6H PRN  senna-docusate, 2 tablet, BID PRN  sodium chloride, 500 mL, Q30 Min PRN            Review of Systems:   A comprehensive review of systems was per the HPI      Physical Exam:     Vitals:    03/11/21 0700 03/11/21 0800 03/11/21 1000 03/11/21 1023   BP: 117/54 124/57 144/63 144/63   Pulse: (!) 59 62 80    Resp: 15 16 (!) 24    Temp:  97.8 F (36.6 C)     TempSrc:  Oral     SpO2: 99% 99% 98%    Weight:       Height:           Intake and Output Summary (Last 24 hours) at Date Time    Intake/Output Summary (Last 24 hours) at 03/11/2021 1121  Last data filed at 03/11/2021 1610  Gross per 24 hour   Intake 1149 ml   Output --   Net 1149 ml       Recent weights:  Weight Monitoring 03/03/2021 03/03/2021 03/04/2021 03/09/2021 03/10/2021   Height 160 cm 160 cm 160 cm 160 cm 160 cm   Height Method Stated Stated - Stated Stated   Weight 58.968 kg 59 kg 60 kg 60 kg 61.9 kg   Weight Method Stated Stated - Stated Bed Scale   BMI (calculated) 23.1 kg/m2 23.1 kg/m2 23.5  kg/m2 23.5 kg/m2 24.2 kg/m2       General: awake, alert, oriented x person, place, not to time. no acute distress.  HEENT: sclera anicteric, mucous membranes moist  Neck: supple, no lymphadenopathy, no thyromegaly, JVD, no carotid bruits  Cardiovascular: regular rate and rhythm, no murmurs, rubs or gallops  Lungs: clear to auscultation bilaterally, without wheezing, rhonchi, or rales  Abdomen: soft, non-tender, non-distended, normoactive bowel sounds, no palpable masses, no hepatosplenomegaly, no rebound or guarding  Extremities: no  clubbing, cyanosis, or edema  Neuro: no gross motor/sensory deficits  Skin: no rashes or lesions noted        Labs:     Recent Labs   Lab 03/11/21  0348 03/10/21  0353 03/09/21  2033   WBC 7.50 8.08 9.88*   Hgb 11.2* 12.2 12.5   Hematocrit 32.3* 34.8 35.7   Platelets 186 190 214     Recent Labs   Lab 03/11/21  0348 03/10/21  0353 03/09/21  2033 03/06/21  0138 03/05/21  0503 03/04/21  2335 03/04/21  1720   Sodium 128* 128* 127*  More results in Results Review 131* 129* 132*   Potassium 3.9 3.5 3.8  More results in Results Review 4.0 3.9 4.1   Chloride 100 97* 94*  More results in Results Review 100 100 101   CO2 22 24 25   More results in Results Review 27 24 25    BUN 13.0 10.0 13.0  More results in Results Review 11.0 11.0 12.0   Creatinine 0.7 0.7 0.8  More results in Results Review 0.7 0.7 0.7   Calcium 8.5 9.0 9.4  More results in Results Review 8.7 9.0 8.7   Albumin  --   --  4.3  --   --   --   --    Phosphorus  --   --   --   --  2.7  --   --    Magnesium  --   --   --   --  2.0 2.2 2.3   Glucose 88 131* 106*  More results in Results Review 96 102* 105*   EGFR >60.0 >60.0 >60.0  More results in Results Review >60.0 >60.0 >60.0   More results in Results Review = values in this interval not displayed.       Recent Labs   Lab 03/09/21  2229   Urine Type Urine, Clean Ca   Color, UA Yellow   Clarity, UA Clear   Specific Gravity UA 1.009   Urine pH 6.5   Nitrite, UA Negative   Ketones UA  Negative   Urobilinogen, UA Normal   Bilirubin, UA Negative   Blood, UA Negative   RBC, UA 0-2   WBC, UA 11-25*     Recent Labs   Lab 03/10/21  2048   Urine Sodium Random 78   Urine Osmolality 384       Imaging personally reviewed, including:       cc: Andrew Au, MD  Pcp, Kathreen Cosier, MD

## 2021-03-11 NOTE — Progress Notes (Signed)
[facility] HOSPITALIST PROGRESS NOTE    Date Time: 03/11/21 8:49 AM  Patient Name: [name]  Attending Physician: Andrew Au, MD        History of Presenting Illness and Interval History/24 hour Events:   [name] is a 87 y.o. female visiting from [place] hx HTN, HLD, hypothyroid, irregular heartbeat, sternotomy for aortic valve replacement >10 years ago then repeat aortic valve replacement 5-6 years ago (procedure sounds like TAVR), intracerebral hemorrhage after fall 2021 [facility] [place], [place] - no residual deficit per daughter), trauma admit 8/15-8/18 for left temporal and smaller rigth temporal hemorrhagic contusion + left greater than right SDH (only right SDH is along tentorial leaflet) + IVH who presents to the hospital with expressive and receptive aphasia. She was doing well after the trauma admit until ~10:30 today when at brunch it was difficult to get her attention even when calling out her name. Later she was found sitting with her head down in her hands and again it was difficult to get her attention. When she spoke there was word finding difficulty and some word salad. She appeared to have expressive and receptive aphasia for me. She denies headache. These symptoms are sudden onset, moderate intensity, without alleviating factors.     Her daughter also noted one episode of right hand shaking last admission making it difficult for her to use a spoon. Her daughter has noted infrequent right facial twitching which started prior to the trauma admission.      PMD: Dr. Arletta Bale [phone] [facility] [place], [place])  Cardiologist: Dr. Lady Saucier [phone] [facility] [place], [place])  8/23: Patient denies any new complaint.  No new numbness or tingling sensation. BP dropped to 77 last night-after IV hydralazine was given.  Blood pressure improved with bolus.  Much better today.    Physical Exam:     Vitals:    03/11/21 0800   BP: 124/57   Pulse: 62   Resp: 16   Temp: 97.8 F  (36.6 C)   SpO2: 99%       Intake and Output Summary (Last 24 hours) at Date Time    Intake/Output Summary (Last 24 hours) at 03/11/2021 0849  Last data filed at 03/11/2021 2956  Gross per 24 hour   Intake 1149 ml   Output --   Net 1149 ml       General: awake, alert, oriented x 3; no acute distress.  HEENT: Anicteric sclera   neck: supple, no lymphadenopathy, no thyromegaly, no JVD, no carotid bruits  Cardiovascular: regular rate and rhythm, no murmurs, rubs or gallops  Lungs: clear to auscultation bilaterally, without wheezing, rhonchi, or rales  Abdomen: soft, non-tender, non-distended; no palpable masses, no hepatosplenomegaly, normoactive bowel sounds, no rebound or guarding  Extremities: no clubbing, cyanosis, or edema  Neuro: cranial nerves grossly intact, strength 5/5 in upper and lower extremities, sensation intact,   Skin: no rashes or lesions noted      Medications:     Current Facility-Administered Medications   Medication Dose Route Frequency    amLODIPine  2.5 mg Oral QHS    amLODIPine  5 mg Oral Daily    brivaracetam  100 mg Oral Q12H SCH    cefuroxime  250 mg Oral Q12H [facility]    digoxin  0.125 mg Oral Once per day on Sun Tue Thu Sat    digoxin  0.25 mg Oral Q Mon/Wed/Fri (AM)    lactobacillus/streptococcus  1 capsule Oral Daily  levothyroxine  88 mcg Oral Daily    lisinopril  10 mg Oral Daily    raloxifene  60 mg Oral Daily    rosuvastatin  20 mg Oral QHS         Labs:     Results       Procedure Component Value Units Date/Time    TSH [[id number]] Collected: 03/11/21 0348    Specimen: Blood Updated: 03/11/21 0513     TSH 1.08 uIU/mL     Basic Metabolic Panel [[id number]]  (Abnormal) Collected: 03/11/21 0348    Specimen: Blood Updated: 03/11/21 0511     Glucose 88 mg/dL      BUN 78.2 mg/dL      Creatinine 0.7 mg/dL      Calcium 8.5 mg/dL      Sodium 956 mEq/L      Potassium 3.9 mEq/L      Chloride 100 mEq/L      CO2 22 mEq/L      Anion Gap 6.0    GFR [[id number]] Collected: 03/11/21 0348     Updated:  03/11/21 0511     EGFR >60.0       Osmolality [[id number]] Collected: 03/11/21 0348    Specimen: Blood Updated: 03/11/21 0504    CBC without differential [[id number]]  (Abnormal) Collected: 03/11/21 0348    Specimen: Blood Updated: 03/11/21 0418     WBC 7.50 x10 3/uL      Hgb 11.2 g/dL      Hematocrit 27.2 %      Platelets 186 x10 3/uL      RBC 3.36 x10 6/uL      MCV 96.1 fL      MCH 33.3 pg      MCHC 34.7 g/dL      RDW 13 %      MPV 9.4 fL      Nucleated RBC 0.0 /100 WBC      Absolute NRBC 0.00 x10 3/uL     Lactic Acid [[id number]] Collected: 03/11/21 0359    Specimen: Blood Updated: 03/11/21 0410     Lactic Acid 1.0 mmol/L     Narrative:      [place] [id number] at 03/11/2021 03:48 Reason: Printed by   mistake/Printing Issues.    Uric acid [[id number]] Collected: 03/11/21 0003    Specimen: Blood Updated: 03/11/21 0031     Uric acid 3.7 mg/dL     Urine Osmolality [[id number]] Collected: 03/10/21 2048    Specimen: Urine Updated: 03/10/21 2329     Urine Osmolality 384 mosm/kg     Urine Sodium Random [[id number]] Collected: 03/10/21 2048    Specimen: Urine Updated: 03/10/21 2312     Urine Sodium Random 78 mEq/L               Radiology:     Radiology Results (24 Hour)       Procedure Component Value Units Date/Time    MR Angiogram Neck W WO Contrast [[id number]] Collected: 03/10/21 1124    Order Status: Completed Updated: 03/10/21 1144    Narrative:      HISTORY: Neuro deficit, acute, stroke suspected. Prior admission  03/03/2021 for left subdural hematoma and left temporal and parenchymal  hemorrhage. Now with expressive and receptive aphasia.    COMPARISON: None available.    TECHNIQUE: MR angiogram of the neck performed on a 1.5 Tesla scanner  without and with 12 mL of Clariscan intravenous contrast. 3D maximum  intensity projection images were created and reviewed. Internal carotid  stenosis was determined based on NASCET criteria.    FINDINGS:     Three-vessel aortic arch. The cervical carotids and vertebral  arteries  are patent bilaterally. Mild narrowing in the proximal V1 segment of the  right cervical vertebral artery. No aneurysm or dissection of the  cervical vasculature.    There is no stenosis in the proximal right cervical internal carotid  artery by NASCET criteria. There is no stenosis in the proximal left  cervical internal carotid artery by NASCET criteria.      Impression:          1. Patent cervical vasculature without aneurysm or dissection. Mild  narrowing in the proximal right cervical vertebral artery.    2. There is no stenosis in the proximal right cervical internal carotid  artery by NASCET criteria.    3. There is no stenosis in the proximal left cervical internal carotid  artery by NASCET criteria.    Gaylyn Rong, MD   03/10/2021 11:42 AM    MRI Brain WO Contrast [[id number]] Collected: 03/10/21 1108    Order Status: Completed Updated: 03/10/21 1126    Narrative:      HISTORY: Neuro deficit, acute, stroke suspected. Prior admission  03/03/2021 for left subdural hematoma and left temporal and parenchymal  hemorrhage. Now with expressive and receptive aphasia.    COMPARISON: 03/09/2021 CT head and 03/04/2021 MRI brain exams.     TECHNIQUE: MRI of the brain performed on a 1.5 Tesla scanner without  intravenous contrast. Examination performed per routine brain imaging  protocol.     FINDINGS:  Evolution and decreased size of the parenchymal hematoma in the  lateral left temporal lobe measuring approximately 1.6 x 2.0 cm (AP by  TR) compared to previous 2.4 x 2.3 cm (AP by TR). Perilesional edema  with mass effect and partial effacement of the temporal horn of the left  lateral ventricle.    Small cortical/subcortical based foci of susceptibility in the inferior  right parietal lobe consistent with small microhemorrhages foreign  change. Mild surrounding edema.    Scattered areas of nonsuppression of sulcal FLAIR signal and sulcal  susceptibility in the left parietotemporal and left frontal  sulci  consistent with sequela of subarachnoid hemorrhage, stable.    Subdural hematoma along left cerebral convexity measuring maximally up  to 3 mm in thickness. Localized mass effect on the left cerebrum without  midline shift. Subdural blood products extending along the left falx and  left tentorial leaflet.     The ventricles are within normal size limits for the degree of  background mild volume loss. Scattered periventricular and subcortical  T2 FLAIR hyperintensities in the supratentorial white matter consistent  with chronic small vessel ischemic changes. Chronic infarcts with  hemosiderin staining in the bilateral cerebellar hemispheres. No  diffusion restriction to suggest recent infarct. The major intracranial  flow voids are maintained.     Bilateral lens extractions. Otherwise, orbits are unremarkable. Trace  mucosal thickening in the ethmoid air cells. Mild polypoid mucosal  thickening in the maxillary antra. The remaining paranasal sinuses and  the right mastoid air cells are clear. Partial left mastoid effusion.      Impression:        1. No acute infarct.    2. Evolution and decreased size of hematoma in the lateral left temporal  lobe and smaller hemorrhagic contusion in the right parietal lobe with  surrounding edema as detailed. No interval hemorrhage compared to the  prior 03/04/2021 MRI  3. Stable left subdural hematoma and sequela of prior subarachnoid  hemorrhage as detailed above.    4. Mild global volume loss, chronic small vessel ischemic changes and  multiple chronic cerebellar infarcts.    Gaylyn Rong, MD   03/10/2021 11:24 AM    MR Angiogram Head WO Contrast [[id number]] Collected: 03/10/21 1104    Order Status: Completed Updated: 03/10/21 1111    Narrative:      HISTORY: Neuro deficit, acute, stroke suspected. Prior admission  03/03/2021 for left subdural hematoma and left temporal and parenchymal  hemorrhage. Now with expressive and receptive aphasia.    COMPARISON: 03/09/2021  CT head and 03/04/2021 MRI brain exams.    TECHNIQUE: MR angiogram of the head performed on a 1.5 Tesla scanner  without intravenous contrast.  3D maximum intensity projection images  were created and reviewed.     FINDINGS:  The major branches of the anterior circulation including the  intracranial carotids, middle cerebral arteries and anterior cerebral  arteries are patent.    The major branches of the posterior circulation including the  intracranial vertebral arteries, basilar artery and posterior cerebral  arteries are patent.    No aneurysm, dissection or stenosis of the circle of Willis vasculature.      Impression:          Normal MRA brain exam.    Gaylyn Rong, MD   03/10/2021 11:08 AM              Assessment/plan:   #Expressive and receptive aphasia :  - CT head shows stable left temporal IPH with cerebral edema and left parafalcine/tentorial leaflet SDH not significantly changed from MRI 8/16.   -MRI this admission reveals decreased size of L temporal lobe hematoma and smaller hemorrhagic contusion in the R  parietal lobe; stable L SDH; mild global volume loss  - v EEG>> no seizure.Dced EEG   -C/w Briviact 100 mg BID-started by neurology  given high suspicion for seizure  -Seizure precaution   -Echo given 2 reported aortic valve replacements>> Noted.  - PT/OT/SLP.     #Hyponatremia :  #SIADH  -Follow. Last admit sodium was 127-132.    -Nephrology consulted-input noted.  -Fluid restriction to 1.5 L a day  -Sodium tablets 2 g 3 times daily with meals.     #UTI - Family reports confusion with UTIs before but never presented like this. Ceftin + probiotic.  -Culture is growing more than 100,000 E. coli.     #Hx HTN  #HLD:  -Amlodipine  -Continue with lisinopril.    # H/o hypothyroid:  -Continue with Synthroid      # H/o irregular heartbeat, sternotomy for aortic valve replacement >10 years ago then repeat aortic valve replacement 5-6 years ago (procedure sounds like TAVR), intracerebral hemorrhage after  fall 2021 [facility] [place], [place] - no residual deficit per daughter), trauma admit 8/15-8/18 for left temporal and smaller rigth temporal hemorrhagic contusion + left greater than right SDH (only right SDH is along tentorial leaflet) + IVH - Noted.      DVT/GI prophylaxis - SCDs. Defer to neurosurgery when she may start heparin for DVT prophylaxis.      Status/Rationale: inpatient     Anticipated discharge disposition and date:    Signed by: Andrew Au, MD, MD   cc:Pcp, Kathreen Cosier, MD

## 2021-03-11 NOTE — Plan of Care (Signed)
Problem: Moderate/High Fall Risk Score >5  Goal: Patient will remain free of falls  Outcome: Progressing  Flowsheets  Taken 03/10/2021 2000 by Quitman Livings, RN  High (Greater than 13):   HIGH-Bed alarm on at all times while patient in bed   HIGH-Initiate use of floor mats as appropriate   HIGH-Consider use of low bed  Taken 03/10/2021 1426 by Gwenyth Bender, Greta Doom, RN  Moderate Risk (6-13): MOD-Apply bed exit alarm if patient is confused  VH Moderate Risk (6-13): YELLOW "FALL RISK" ARM BAND  VH High Risk (Greater than 13): ALL REQUIRED MODERATE INTERVENTIONS     Problem: Safety  Goal: Patient will be free from injury during hospitalization  Outcome: Progressing  Flowsheets (Taken 03/10/2021 1426 by Hedda Slade, RN)  Patient will be free from injury during hospitalization:   Assess patient's risk for falls and implement fall prevention plan of care per policy   Provide and maintain safe environment   Use appropriate transfer methods   Ensure appropriate safety devices are available at the bedside   Include patient/ family/ care giver in decisions related to safety   Hourly rounding   Assess for patients risk for elopement and implement Elopement Risk Plan per policy   Provide alternative method of communication if needed (communication boards, writing)  Goal: Patient will be free from infection during hospitalization  Outcome: Progressing  Flowsheets (Taken 03/10/2021 0439)  Free from Infection during hospitalization:   Assess and monitor for signs and symptoms of infection   Monitor lab/diagnostic results   Monitor all insertion sites (i.e. indwelling lines, tubes, urinary catheters, and drains)   Encourage patient and family to use good hand hygiene technique     Problem: Pain  Goal: Pain at adequate level as identified by patient  Outcome: Progressing  Flowsheets (Taken 03/10/2021 0439)  Pain at adequate level as identified by patient:   Identify patient comfort function goal   Assess pain on admission, during  daily assessment and/or before any "as needed" intervention(s)   Reassess pain within 30-60 minutes of any procedure/intervention, per Pain Assessment, Intervention, Reassessment (AIR) Cycle   Evaluate if patient comfort function goal is met   Evaluate patient's satisfaction with pain management progress   Offer non-pharmacological pain management interventions   Consult/collaborate with Pain Service   Include patient/patient care companion in decisions related to pain management as needed     Problem: Side Effects from Pain Analgesia  Goal: Patient will experience minimal side effects of analgesic therapy  Outcome: Progressing  Flowsheets (Taken 03/10/2021 0439)  Patient will experience minimal side effects of analgesic therapy:   Monitor/assess patient's respiratory status (RR depth, effort, breath sounds)   Assess for changes in cognitive function   Prevent/manage side effects per LIP orders (i.e. nausea, vomiting, pruritus, constipation, urinary retention, etc.)     Problem: Discharge Barriers  Goal: Patient will be discharged home or other facility with appropriate resources  Outcome: Progressing     Problem: Psychosocial and Spiritual Needs  Goal: Demonstrates ability to cope with hospitalization/illness  Outcome: Progressing  Flowsheets (Taken 03/10/2021 0439)  Demonstrates ability to cope with hospitalizations/illness:   Encourage verbalization of feelings/concerns/expectations   Provide quiet environment   Include patient/ patient care companion in decisions     Problem: Compromised Tissue integrity  Goal: Damaged tissue is healing and protected  Outcome: Progressing  Flowsheets (Taken 03/10/2021 1426 by Hedda Slade, RN)  Damaged tissue is healing and protected:   Provide wound care per  wound care algorithm   Monitor/assess Braden scale every shift  Goal: Nutritional status is improving  Outcome: Progressing  Flowsheets (Taken 03/11/2021 0155)  Nutritional status is improving:   Assist patient with  eating   Allow adequate time for meals   Encourage patient to take dietary supplement(s) as ordered   Collaborate with Clinical Nutritionist   Include patient/patient care companion in decisions related to nutrition

## 2021-03-11 NOTE — Plan of Care (Signed)
Problem: Safety  Goal: Patient will be free from injury during hospitalization  Outcome: Progressing  Flowsheets (Taken 03/10/2021 1426 by Hedda Slade, RN)  Patient will be free from injury during hospitalization:   Assess patient's risk for falls and implement fall prevention plan of care per policy   Provide and maintain safe environment   Use appropriate transfer methods   Ensure appropriate safety devices are available at the bedside   Include patient/ family/ care giver in decisions related to safety   Hourly rounding   Assess for patients risk for elopement and implement Elopement Risk Plan per policy   Provide alternative method of communication if needed (communication boards, writing)  Goal: Patient will be free from infection during hospitalization  Outcome: Progressing  Flowsheets (Taken 03/10/2021 0439 by Quitman Livings, RN)  Free from Infection during hospitalization:   Assess and monitor for signs and symptoms of infection   Monitor lab/diagnostic results   Monitor all insertion sites (i.e. indwelling lines, tubes, urinary catheters, and drains)   Encourage patient and family to use good hand hygiene technique     Problem: Pain  Goal: Pain at adequate level as identified by patient  Outcome: Progressing  Flowsheets (Taken 03/10/2021 0439 by Quitman Livings, RN)  Pain at adequate level as identified by patient:   Identify patient comfort function goal   Assess pain on admission, during daily assessment and/or before any "as needed" intervention(s)   Reassess pain within 30-60 minutes of any procedure/intervention, per Pain Assessment, Intervention, Reassessment (AIR) Cycle   Evaluate if patient comfort function goal is met   Evaluate patient's satisfaction with pain management progress   Offer non-pharmacological pain management interventions   Consult/collaborate with Pain Service   Include patient/patient care companion in decisions related to pain management as needed     Problem: Compromised Tissue  integrity  Goal: Damaged tissue is healing and protected  Outcome: Progressing  Flowsheets (Taken 03/10/2021 1426 by Hedda Slade, RN)  Damaged tissue is healing and protected:   Provide wound care per wound care algorithm   Monitor/assess Braden scale every shift     Problem: Neurological Deficit  Goal: Neurological status is stable or improving  Outcome: Progressing  Flowsheets (Taken 03/11/2021 1828 by Bradly Bienenstock, RN)  Neurological status is stable or improving:   Monitor/assess/document neurological assessment (Stroke: every 4 hours)   Monitor/assess NIH Stroke Scale   Re-assess NIH Stroke Scale for any change in status   Observe for seizure activity and initiate seizure precautions if indicated   Perform CAM Assessment     Problem: Compromised Hemodynamic Status  Goal: Vital signs and fluid balance maintained/improved  Outcome: Progressing  Flowsheets (Taken 03/11/2021 2313)  Vital signs and fluid balance are maintained/improved:   Position patient for maximum circulation/cardiac output   Monitor/assess vitals and hemodynamic parameters with position changes   Monitor intake and output. Notify LIP if urine output is less than 30 mL/hour.   Monitor and compare daily weight   Monitor/assess lab values and report abnormal values     Problem: Communication Impairment  Goal: Will be able to express needs and understand communication  Outcome: Progressing  Flowsheets (Taken 03/11/2021 2313)  Able to express needs and understand communication:   Provide alternative method of communication if needed   Consult/collaborate with Speech Language Pathology (SLP)   Include patient care companion in decisions related to communication   Consult/collaborate with Case Management/Social Work   Patient/patient care companion demonstrates understanding on  disease process, treatment plan, medications and discharge plan     Problem: Nutrition  Goal: Nutritional intake is adequate  Outcome: Progressing  Flowsheets (Taken  03/11/2021 2313)  Nutritional intake is adequate:   Assist patient with meals/food selection   Monitor daily weights   Allow adequate time for meals   Encourage/perform oral hygiene as appropriate   Encourage/administer dietary supplements as ordered (i.e. tube feed, TPN, oral, OGT/NGT, supplements)   Consult/collaborate with Clinical Nutritionist   Include patient/patient care companion in decisions related to nutrition   Assess anorexia, appetite, and amount of meal/food tolerated   Consult/collaborate with Speech Therapy (swallow evaluations)   NURSING PROGRESS NOTE: STROKE UNIT    Patient Name: [name] (87 y.o. female)  Admission Date: 03/09/2021 [facility] Day 0)      Recent Labs   Lab 03/11/21  0348   Sodium 128*   Potassium 3.9   Chloride 100   CO2 22   BUN 13.0   Creatinine 0.7   EGFR >60.0   Glucose 88   Calcium 8.5       Recent Labs   Lab 03/11/21  0348   WBC 7.50   Hgb 11.2*   Hematocrit 32.3*   Platelets 186         Patient Lines/Drains/Airways Status       Active Lines, Drains and Airways       Name Placement date Placement time Site Days    Peripheral IV 03/09/21 18 G Standard Left Antecubital 03/09/21  2019  Antecubital  2                       Braden Scale Score: 18 (03/11/21 2000)      Skin Integrity: Bruising  Bruising Skin Location: scattered     ASSESSMENT/PLAN:    Last BM: 03/11/21    Pending Orders: neuro check q4h, monitor BP, monitor sodium    Discharge Plan: home with supervision    POC / Family Update: daughter and son    Shift Note: Pt is A/O x4, FC, slurred speech. MAE-moderate strength throughout. Assist x1 OOB.   VSS. On RA-lungs clear/diminished. On tele in NSR-HR in 60's-70's.     Falls precautions in place: bed alarm, fall mat, bed low, call bell within reach.    PPE worn in patient's room: gloves, face mask

## 2021-03-11 NOTE — Plan of Care (Signed)
NURSING PROGRESS NOTE: STROKE UNIT    Patient Name: [name] (87 y.o. female)  Admission Date: 03/09/2021 [facility] Day 0)      Recent Labs   Lab 03/11/21  0348   Sodium 128*   Potassium 3.9   Chloride 100   CO2 22   BUN 13.0   Creatinine 0.7   EGFR >60.0   Glucose 88   Calcium 8.5       Recent Labs   Lab 03/11/21  0348   WBC 7.50   Hgb 11.2*   Hematocrit 32.3*   Platelets 186         Patient Lines/Drains/Airways Status       Active Lines, Drains and Airways       Name Placement date Placement time Site Days    Peripheral IV 03/09/21 18 G Standard Left Antecubital 03/09/21  2019  Antecubital  1                       Braden Scale Score: 18 (03/11/21 0800)      Skin Integrity: Bruising, Laceration  Bruising Skin Location: scattered         Safety Checklist   1:1 Sitter N    Avasys N     ASSESSMENT/PLAN:    Last BM: 8/23    Pending Orders:  Monitor BP    Discharge Plan:  Home     POC / Family Update:  Pt's son at bedside    Shift Note:  HARD OF HEARING  Pt A/Ox4, slurred speech, FC, MAE- mod strength throughout.  Cont x2.   OOB 1 person assist. Sensation intact.  Tolerating reg diet, thins, pills whole.  Denies pain.  Afib on tele.  HR 70s.      Fall/safety/aspiration/seizure precautions in place.  Will continue to monitor via purposeful rounding.   PPE worn in patient's room:  gloves, mask  Problem: Neurological Deficit  Goal: Neurological status is stable or improving  Outcome: Progressing  Flowsheets (Taken 03/11/2021 1828)  Neurological status is stable or improving:   Monitor/assess/document neurological assessment (Stroke: every 4 hours)   Monitor/assess NIH Stroke Scale   Re-assess NIH Stroke Scale for any change in status   Observe for seizure activity and initiate seizure precautions if indicated   Perform CAM Assessment     Problem: Potential for Aspiration  Goal: Risk of aspiration will be minimized  Outcome: Progressing  Flowsheets (Taken 03/11/2021 1828)  Risk of aspiration will be minimized:    Assess and monitor ability to swallow   Monitor/assess for signs of aspiration (tachypnea, cough, wheezing, clearing throat, hoarseness after eating, decrease in SaO2)     Problem: Peripheral Neurovascular Impairment  Goal: Extremity color, movement, sensation are maintained or improved  Outcome: Progressing  Flowsheets (Taken 03/11/2021 1828)  Extremity color, movement, sensation are maintained or improved:   Increase mobility as tolerated/progressive mobility   Assess and monitor application of corrective devices (cast, brace, splint), check skin integrity   Teach/review/reinforce ankle pump exercises   Assess extremity for proper alignment   VTE Prevention: Administer anticoagulant(s) and/or apply anti-embolism stockings/devices as ordered

## 2021-03-11 NOTE — Nursing Progress Note (Addendum)
This RN received report from Sunni RN w/ Neuro [facility].     1800: Pt arrived on unit via bed.  Pt's son also at bedside.  Oriented to unit.  Fall/safety/seizure precautions in place.  Will continue to monitor

## 2021-03-11 NOTE — Progress Notes (Signed)
NURSING PROGRESS NOTE    [name] is a 87 y.o. female  Admitted 03/09/2021  8:28 PM [facility] day 0)    Indication for Neuro [facility] Status:   Neuro check q 4 hrs  NIHSS q shift    Major Shift Events:   @0200 , pt's  BP was in 70s/30s. CNS notified, 500 ml of NS bolus given.    @0215 , pt had neuro change where she was A&O to self only. CNS contacted.    @ 0300, pt's neuro improved. A&O x 2 to 3, disoriented to situation and got place right after giving choices.    @0600 , pt's BP hypotensive again. 500 mL of NS bolus given. CNS notified.    Whenever pt receives bolus, she goes to the bathroom right away and urinates.    Pt's daughter, [name], stayed overnight at bedside.    Review of Systems  Neuro:  A&O x 4  Clear speech, receptive and expressive aphasia  Follow commands  Pupils 3 mm, R & B  Refer to flowsheet for details    Cardiac:  SR  Pulses palpable  Per instruction for PRN Hydralazine, SBP < 160    Respiratory:  RA  Lung sounds clear, diminished    GI/GU:  Regular diet  Walks to the bathroom with stand-by assist    Skin:  Refer to flowsheet for details    BM this shift? N    Recent Labs   Lab 03/11/21  0348   Sodium 128*   Potassium 3.9   Chloride 100   CO2 22   BUN 13.0   Creatinine 0.7   EGFR >60.0   Glucose 88   Calcium 8.5       Recent Labs   Lab 03/11/21  0348   WBC 7.50   Hgb 11.2*   Hematocrit 32.3*   Platelets 186       LDAs  Patient Lines/Drains/Airways Status       Active Lines, Drains and Airways       Name Placement date Placement time Site Days    Peripheral IV 03/09/21 18 G Standard Left Antecubital 03/09/21  2019  Antecubital  1                Safety Checklist    Fall Precautions Y    Avasys N    Seizure Precautions Y    Aspiration Precautions Y   Belongings Checked Y     Service Essentials  Medication Teaching Y   Purposeful Hourly Rounding Y

## 2021-03-11 NOTE — Progress Notes (Signed)
NURSING PROGRESS NOTE    [name] is a 87 y.o. female  Admitted 03/09/2021  8:28 PM [facility] day 0)    Indication for Neuro [facility] Status:   Frtequent monitoring    Major Shift Events:   EEG Benson'ed    Review of Systems  Neuro:  A&Ox2-4 (confused with time or situation sometimes), MAE, full sensation, follows commands, hard of hearing, aphagia, pupil-3RB    Cardiac:  Afebrile  SBP WNL  Pulse palpable    Respiratory:  RA  Lungs sounds clear    GI/GU:  Regular diet thin liquid  Continent x2  OOB close stand by assist    Skin:  See flow sheet for details    BM this shift? N    Recent Labs   Lab 03/11/21  0348   Sodium 128*   Potassium 3.9   Chloride 100   CO2 22   BUN 13.0   Creatinine 0.7   EGFR >60.0   Glucose 88   Calcium 8.5         Recent Labs   Lab 03/11/21  0348   WBC 7.50   Hgb 11.2*   Hematocrit 32.3*   Platelets 186         LDAs  Patient Lines/Drains/Airways Status       Active Lines, Drains and Airways       Name Placement date Placement time Site Days    Peripheral IV 03/09/21 18 G Standard Left Antecubital 03/09/21  2019  Antecubital  1                    Indication for Central Access and estimated target removal date?  NA    Indication for Foley and estimated target removal date?  NA    Safety Checklist    Fall Precautions Y    Avasys N    Seizure Precautions Y    Aspiration Precautions Y   Belongings Checked Y     Service Essentials  Medication Teaching Y   Purposeful Hourly Rounding Y       Interpreter Services:  Does the patient require an Interpreter? N

## 2021-03-11 NOTE — PT Progress Note (Signed)
Physical Therapy Note    [facility]   Physical Therapy Treatment  Patient:  [name] MRN#:  [id number]  Unit: [facility] TOWER 7  Bed: F725/F725.01    Post Acute Care Therapy Recommendations:   Discharge Recommendations: Home with supervision (initial hands on assist with mobility)     Milestones to be reached to achieve recommendation: none  Anticipate achievement in n/a sessions    DME Recommended for Discharge: Shower chair    If Home with supervision (initial hands on assist with mobiltiy) recommended discharge disposition is not available, patient will need rehab.     Therapy discharge recommendations may change with patient status.  Please refer to most recent note for up-to-date recommendations.    Assessment:   Significant Findings: none    Pt received supine in bed and agreeable to participation with PT session. Min increased confusion with pt forgetful. Emphasis placed on ambulation with decreased cadence and appropriate pacing with directional changes. VC/TC and demo for appropriate sequencing with intermittent reinforcement. Pt will continue to benefit from acute PT services to maximize safety and independence with mobility prior to discharge.     Assessment: Decreased cognition, Decreased endurance/activity tolerance, Decreased balance, Gait impairment, Decreased functional mobility  Progress: Progressing toward goals  Prognosis: Good, With continued PT status post acute discharge  Risks/Benefits/POC Discussed with Pt/Family: With patient/family    Rehab tech present: Lianne. PPE worn: gloves, mask     Treatment Activities: Gait training     Educated the patient to role of physical therapy, plan of care, goals of therapy and safety with mobility and ADLs.    Plan:   Treatment/Interventions: Gait training, Neuromuscular re-education, Functional transfer training, Patient/family training, Equipment eval/education, Continued evaluation, Exercise    PT Frequency:  3-4x/wk    Continue plan of care.       Precautions and Contraindications:   Falls  Seizures   HOH    Updated Medical Status/Imaging/Labs: Reviewed     Subjective:    "This is my life, I'm not use to walking this slow"   Patient's medical condition is appropriate for Physical Therapy intervention at this time.  Patient is agreeable to participation in the therapy session. Nursing clears patient for therapy.    Pain Assessment: no/denies pain     Objective:   Patient is in bed with PIV access, vEEG and female catheter in place.    Cognition  Arousal/alertness: Appropriate response to stimuli - with intermittent reinforcement.   Attention span: Distractible   Behavior: Calm , Cooperative , Distractible , and Forgetful     Functional Mobility  Supine to sit: Contact Guard  Sit to stand: Stand by Assist with No AD   Stand to sit: Stand by Assist with No AD   Cueing required: VC/TC for upright posture, increased step length , increased foot clearance , sequencing , and LE activation    PMP - Progressive Mobility Protocol   PMP Activity: Step 6 - Walks in Room  Distance Walked (ft) (Step 6,7): 60 Feet     Ambulation  Distance: 60 ft   Device: No AD   Assist level: Stand by Assist  Pattern: decreased cadence, decreased step length , decreased foot clearance, decreased heel-toe gait, and step through   Cueing required: VC/TC for upright posture, increased step length , increased foot clearance , sequencing , LE activation, and hand placement  - Multidirectional steps with return to midline emphasis on midline.  Balance  Static sitting: Good  Dynamic sitting: Good  Static Standing: Good  Dynamic standing: Good-    Participation and Activity Tolerance  Participation effort: Good  Patient endurance: Good    Patient left in bed with call bell within reach, all needs met and all questions answered.  SCDs off as found, fall mat in place, bed alarm on, chair alarm n/a   RN notified of session outcome and patient response.      PPE worn by therapist: gloves, surgical mask, goggles   PPE worn by patient: none     Goals  Goal Formulation: With patient/family  Time for Goal Acheivement: 5 visits  Goals: Select goal  Pt Will Go Supine To Sit: with supervision, to maximize functional mobility and independence  Pt Will Perform Sit to Stand: with supervision, to maximize functional mobility and independence  Pt Will Ambulate: 101-150 feet, with stand by assist, to maximize functional mobility and independence    Time of Treatment:  PT Received On: 03/11/21  Start Time: 1045  Stop Time: 1115  Time Calculation (min): 30 min  Treatment # 1 out of 5 visits    Torrie Mayers, DPT, [phone]

## 2021-03-11 NOTE — Progress Notes (Signed)
TOWER TRANSFER NOTE      Patient: [name],[name] (87 y.o., female,01-02-1928)  Admission Date: 03/09/2021(Hospital Day 0)    Report Given to: Carlyn    Nurse giving report: Hedda Slade, RN [phone]    Transportation Method: Bed  Meds transported with patient: Y  Chart with patient: Y  Patient Belongings transferred with patient: Y    Family notified?: Y    Patient Lines/Drains/Airways Status       Active Lines, Drains and Airways       Name Placement date Placement time Site Days    Peripheral IV 03/09/21 18 G Standard Left Antecubital 03/09/21  2019  Antecubital  1                     Skin Integrity: Bruising, Laceration  Bruising Skin Location: scattered    Braden Scale Score: 18 (03/11/21 0800)           PENDING LABS NA    PENDING ORDERS NA    UPDATED PLAN OF CARE NA    LAST BM N     Recent Labs   Lab 03/11/21  0348   Sodium 128*   Potassium 3.9   Chloride 100   CO2 22   BUN 13.0   Creatinine 0.7   EGFR >60.0   Glucose 88   Calcium 8.5       Recent Labs   Lab 03/11/21  0348   WBC 7.50   Hgb 11.2*   Hematocrit 32.3*   Platelets 186         SAFETY CHECKLIST  FALL PRECAUTIONS Y M   1:1 SITTER N    AVASYS N    ASPIRATION PRECAUTIONS Y    ORAL CARE N    RESTRAINTS N

## 2021-03-11 NOTE — Progress Notes (Signed)
[facility] Consultation Progress Note    Date Time: 03/11/21 11:06 AM  Patient Name: [name]  Outpatient Neurologist :    CC: aphasia       Assessment:   Expressive > receptive aphasia in a 87 y/o female with a PMH significant for HTN, HLD, hypothyroisism, prior fall c/b ICH (2020). MRI this admission reveals decreased size of L temporal lobe hematoma and smaller hemorrhagic contusion in the R  parietal lobe; stable L SDH; mild global volume loss. 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 Etiology for her aphasia concerning for seizures. Aphasia improved since starting AEDs        Patient Active Problem List   Diagnosis    Nontraumatic acute subdural hemorrhage    Aphasia    Cerebral edema    Left temporal and right parietal hemorrhagic contusions on MRI 03/04/21    Subdural hematoma    Hyponatremia       Plan:   -vEEG report reviewed. Ok to d/c today   -Continue briviact 100 mg BID. Continue this upon discharge, can determine duration as outpatient   -No AC/antiplatelets/NSAIDs  -Seizure precautions to prevent injury during a convulsive episode:   - Patient bed in the lowest position with side rails up and padded  - Suction and oxygen equipment must be available at the patient's bedside  - Ensure IV access established  - Avoid situations that could precipitate a seizure (blinking lights, fatigue).  - Ativan 1-2 mg IV prn seizure lasting 5 minutes or 2 within 30 minutes without interval return to baseline mental status   -PT/OT/Speech   -No further inpatient neurological recommendations. Follow up with Dr Becky Sax or Dr Delane Ginger in 3-4     Neurology Attending Note:    Total time of the encounter was 35 minutes, of which >50% was spent reviewing the chart, updating orders, coordinating care, and at the bedside counseling. I performed the substantive portion of the visit by providing more  than 50% of the total encounter time, and spent 20 minutes. Patient was also seen by  APP  who spent .  I reviewed the chart, relevant neuro-imaging and labs on the above patient.  I personally examined the patient.  I also conferred with the midlevel and agree with the findings and plan outlined above.           Kendrick Fries, M.D.  Board Certified Neurology, ABPN  Board Certified NeuroImaging, UCNS   [facility]       Interval History/Subjective:   No acute neurological events documented or reported overnight  Aphasia improved today  Denies HA, paresthesias, weakness, visual disturbances, dizziness.      Medications:     Current Facility-Administered Medications   Medication Dose Route Frequency    amLODIPine  2.5 mg Oral QHS    amLODIPine  5 mg Oral Daily    brivaracetam  100 mg Oral Q12H [facility]    cefuroxime  250 mg Oral Q12H [facility]    digoxin  0.125 mg Oral Once per day on Sun Tue Thu Sat    digoxin  0.25 mg Oral Q Mon/Wed/Fri (AM)    lactobacillus/streptococcus  1 capsule Oral Daily    levothyroxine  88 mcg Oral Daily    lisinopril  10 mg Oral Daily    raloxifene  60 mg Oral Daily    rosuvastatin  20 mg Oral QHS    [  START ON 03/12/2021] sodium chloride  1 g Oral TID MEALS    sodium chloride  2 g Oral TID MEALS       Review of Systems:   Negative except for what is mentioned in the interval/subjective section       Physical Exam:   Temp:  [97.7 F (36.5 C)-98.4 F (36.9 C)] 97.8 F (36.6 C)  Heart Rate:  [59-83] 80  Resp Rate:  [12-25] 24  BP: (74-144)/(37-70) 144/63    Vital Signs:  Reviewed     General: Well developed and well nourished. No acute distress. Cooperative with the exam  ENT: Normal oral mucosa, no ear or nose discharge  Neck: Symmetric, no deformities  CV: RRR  Resp: No audible wheezing, normal work of breathing  Abd: Soft, nondistended  Skin: Intact, extremities normal in color  Psych: Affect is normal, good insight     Mental Status: The patient is awake, alert and oriented to person,  place. Orientation limited 2/2 aphasia.  Expressive > receptive aphasia. Able to name most objects Able to follow simple commands, difficulty with 2 step commands   Fund of knowledge, memory not be formally evaluated           Cranial nerves:   -CN II: Visual fields full to bedside confrontation   -CN III, IV, VI: Pupils equal, round, and reactive to light; extraocular movements intact; no ptosis                                 -CN VII: Face symmetric   -CN VIII: Hearing intact to conversational speech   -CN IX, X:  normal phonation        Motor: Muscle tone normal without spasticity or flaccidity. No atrophy. Equal, symmetric throughout   Sensory:   Light touch intact.     Reflexes:      Plantars: down going bilaterally      Coordination: No tremors     Gait: deferred          Labs:     Results       Procedure Component Value Units Date/Time    Urine culture [[id number]] Collected: 03/09/21 2229    Specimen: Bladder Updated: 03/11/21 1016    Narrative:      ORDER#: [id number]                                    ORDERED BY: ZODDA, DAVID  SOURCE: Urine                                        COLLECTED:  03/09/21 22:29  ANTIBIOTICS AT COLL.:                                RECEIVED :  03/09/21 22:33  Culture Urine                              PRELIM      03/11/21 10:16   +  03/11/21   >100,000 CFU/ML Escherichia coli               Further workup to follow including  susceptibility testing        Osmolality [[id number]]  (Abnormal) Collected: 03/11/21 0348    Specimen: Blood Updated: 03/11/21 0856     Osmolality 275 mosm/kg     TSH [[id number]] Collected: 03/11/21 0348    Specimen: Blood Updated: 03/11/21 0513     TSH 1.08 uIU/mL     Basic Metabolic Panel [[id number]]  (Abnormal) Collected: 03/11/21 0348    Specimen: Blood Updated: 03/11/21 0511     Glucose 88 mg/dL      BUN 08.6 mg/dL      Creatinine 0.7 mg/dL      Calcium 8.5 mg/dL      Sodium 578 mEq/L      Potassium 3.9 mEq/L      Chloride 100 mEq/L      CO2 22 mEq/L       Anion Gap 6.0    GFR [[id number]] Collected: 03/11/21 0348     Updated: 03/11/21 0511     EGFR >60.0       CBC without differential [[id number]]  (Abnormal) Collected: 03/11/21 0348    Specimen: Blood Updated: 03/11/21 0418     WBC 7.50 x10 3/uL      Hgb 11.2 g/dL      Hematocrit 27.2 %      Platelets 186 x10 3/uL      RBC 3.36 x10 6/uL      MCV 96.1 fL      MCH 33.3 pg      MCHC 34.7 g/dL      RDW 13 %      MPV 9.4 fL      Nucleated RBC 0.0 /100 WBC      Absolute NRBC 0.00 x10 3/uL     Lactic Acid [[id number]] Collected: 03/11/21 0359    Specimen: Blood Updated: 03/11/21 0410     Lactic Acid 1.0 mmol/L     Narrative:      [place]uled [place] [id number] at 03/11/2021 03:48 Reason: Printed by   mistake/Printing Issues.    Uric acid [[id number]] Collected: 03/11/21 0003    Specimen: Blood Updated: 03/11/21 0031     Uric acid 3.7 mg/dL     Urine Osmolality [[id number]] Collected: 03/10/21 2048    Specimen: Urine Updated: 03/10/21 2329     Urine Osmolality 384 mosm/kg     Urine Sodium Random [[id number]] Collected: 03/10/21 2048    Specimen: Urine Updated: 03/10/21 2312     Urine Sodium Random 78 mEq/L             Rads:   CT Head WO Contrast    Result Date: 03/09/2021   Previously visualized thin left cerebral convexity subdural hematoma not definitely seen on this examination. The study is otherwise without significant interval change as above.  Leandro Reasoner, MD  03/09/2021 9:35 PM    MR Angiogram Head WO Contrast    Result Date: 03/10/2021  Normal MRA brain exam. Gaylyn Rong, MD  03/10/2021 11:08 AM    MR Angiogram Neck W WO Contrast    Result Date: 03/10/2021  1. Patent cervical vasculature without aneurysm or dissection. Mild narrowing in the proximal right cervical vertebral artery. 2. There is no stenosis in the proximal right cervical internal carotid artery by NASCET criteria. 3. There is no stenosis in the proximal left cervical internal carotid artery by NASCET criteria. Gaylyn Rong, MD  03/10/2021 11:42 AM    MRI  Brain WO Contrast    Result Date: 03/10/2021  1. No acute infarct.  2. Evolution and decreased size of hematoma in the lateral left temporal lobe and smaller hemorrhagic contusion in the right parietal lobe with surrounding edema as detailed. No interval hemorrhage compared to the prior 03/04/2021 MRI 3. Stable left subdural hematoma and sequela of prior subarachnoid hemorrhage as detailed above. 4. Mild global volume loss, chronic small vessel ischemic changes and multiple chronic cerebellar infarcts. Gaylyn Rong, MD  03/10/2021 11:24 AM    MRI Brain W WO Contrast    Result Date: 03/04/2021  1. Hemorrhagic contusion with hematoma in the lateral left temporal lobe and smaller hemorrhagic contusion in the right parietal lobe, with surrounding edema as detailed above. 2. Additional multifocal left greater than right subdural, left subarachnoid and intraventricular hemorrhage as detailed. No midline shift. 3. Mild smooth diffuse pachymeningeal enhancement. This finding could be seen in the setting of intracranial hypotension, versus other meningeal process. Correlate clinically, with CSF sampling if indicated. 4. Background mild global volume loss, chronic small vessel ischemic changes and multiple chronic cerebellar infarcts. Gaylyn Rong, MD  03/04/2021 9:55 PM     Signed by:  Ladoris Gene, FNP-BC  Nurse Practitioner  [place]  Spectra: IAH [phone]  [facility] [phone]    *Final recommendations per attending neurologist who will also see patient today and record notes.

## 2021-03-11 NOTE — Progress Notes (Signed)
The following test LTEM has been discontinued as per order. The EEG electrodes were removed with Mavidon and skin break down was not present. If present the following areas were affected No visible skin break-down and the assigned nurse was informed. The report will follow.

## 2021-03-11 NOTE — Procedures (Signed)
Continuous Video-EEG Report    Patients name: [name]   Patients date of birth: 1928-02-26     Date of study: 03/10/21 1717 - 03/11/21 0800    REASON FOR STUDY: eval for seizure; aphasia    HISTORY: Patient is a 87 y.o. female with history of HTN, HLD, prior fall c/b ICH (2020), trauma admit 8/15-8/18 for left temporal and smaller rigth temporal hemorrhagic contusion + left greater than right SDH (only right SDH is along tentorial leaflet) + IVH who now presents to the hospital with expressive and receptive aphasia concerning for seizure.    INTRODUCTION: A Video EEG was performed using the standard international 10/20 Electrode Placement system with the following additional electrode(s): EKG    STATE: awake, asleep  MEDICATIONS: brivaracetam  TECHNICAL PROBLEMS: Excessive muscle artifact at times    DESCRIPTION OF THE RECORD:    Background:  Predominant frequencies: alpha, beta in the right hemisphere; alpha/beta with intermixed theta/delta on the left as below  Posterior dominant rhythm: 9 Hz   Symmetry: asymmetric due to F7/T3 maximal focal delta slowing  Continuity: continuous  Voltage: normal  Variability: present  Reactivity: present  State changes: present  Sleep: stage 1, 2, 3    Periodic Discharges or Rhythmic patterns: Occasional, 1-1.5 Hz, left temporal intermittent rhythmic delta activity (TIRDA), F7/T3 maximal    Sporadic Epileptiform Discharges: none    Seizures: none    IMPRESSION:   Abnormal EEG due to:   1. Occasional left temporal intermittent rhythmic delta activity (TIRDA)   2. Left anterior to midtemporal focal slowing    CLINICAL CORRELATION:  This EEG shows focal cerebral dysfunction in the left anterior to midtemporal region, which is consistent with the known underlying structural abnormality. 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 No electrographic seizures were  seen.    L slower:    L TIRDA        Asleep          Diannia Ruder, MD  [facility]  Board Certified in Adult Neurology, Epilepsy, and Clinical Neurophysiology by the ABPN

## 2021-03-12 ENCOUNTER — Ambulatory Visit (INDEPENDENT_AMBULATORY_CARE_PROVIDER_SITE_OTHER): Payer: Medicare Other | Admitting: Cardiology

## 2021-03-12 ENCOUNTER — Ambulatory Visit: Payer: Medicare Other

## 2021-03-12 DIAGNOSIS — I6201 Nontraumatic acute subdural hemorrhage: Secondary | ICD-10-CM

## 2021-03-12 LAB — BASIC METABOLIC PANEL
Anion Gap: 6 (ref 5.0–15.0)
BUN: 10 mg/dL (ref 7.0–19.0)
CO2: 23 mEq/L (ref 22–29)
Calcium: 8.4 mg/dL (ref 7.9–10.2)
Chloride: 103 mEq/L (ref 100–111)
Creatinine: 0.6 mg/dL (ref 0.6–1.0)
Glucose: 96 mg/dL (ref 70–100)
Potassium: 4.1 mEq/L (ref 3.5–5.1)
Sodium: 132 mEq/L — ABNORMAL LOW (ref 136–145)

## 2021-03-12 LAB — GFR: EGFR: >60

## 2021-03-12 MED ORDER — SODIUM CHLORIDE 1 G PO TABS
1.0000 g | ORAL_TABLET | Freq: Three times a day (TID) | ORAL | 0 refills | Status: DC
Start: 2021-03-12 — End: 2021-04-10

## 2021-03-12 MED ORDER — SODIUM CHLORIDE 1 G PO TABS
1.0000 g | ORAL_TABLET | Freq: Three times a day (TID) | ORAL | 0 refills | Status: DC
Start: 2021-03-12 — End: 2021-03-12

## 2021-03-12 MED ORDER — RISAQUAD PO CAPS
1.0000 | ORAL_CAPSULE | Freq: Every day | ORAL | 0 refills | Status: AC
Start: 2021-03-13 — End: 2021-03-18

## 2021-03-12 MED ORDER — CEFUROXIME AXETIL 250 MG PO TABS
250.0000 mg | ORAL_TABLET | Freq: Two times a day (BID) | ORAL | 0 refills | Status: AC
Start: 2021-03-12 — End: 2021-03-16

## 2021-03-12 MED ORDER — CEFUROXIME AXETIL 250 MG PO TABS
250.0000 mg | ORAL_TABLET | Freq: Two times a day (BID) | ORAL | 0 refills | Status: DC
Start: 2021-03-12 — End: 2021-03-12

## 2021-03-12 MED ORDER — RISAQUAD PO CAPS
1.0000 | ORAL_CAPSULE | Freq: Every day | ORAL | 0 refills | Status: DC
Start: 2021-03-13 — End: 2021-03-12

## 2021-03-12 MED ORDER — BRIVARACETAM 100 MG PO TABS
100.0000 mg | ORAL_TABLET | Freq: Two times a day (BID) | ORAL | 0 refills | Status: DC
Start: 2021-03-12 — End: 2021-03-12

## 2021-03-12 MED ORDER — BRIVARACETAM 100 MG PO TABS
100.0000 mg | ORAL_TABLET | Freq: Two times a day (BID) | ORAL | 0 refills | Status: DC
Start: 2021-03-12 — End: 2021-04-11

## 2021-03-12 NOTE — OT Progress Note (Signed)
[facility]   Occupational Therapy Treatment     Patient: [name]    MRN#: [id number]   Unit: [facility] TOWER [address] WEST  Bed: F612/F612.01      Post Acute Care Therapy Recommendations:   Discharge Recommendations: Home with supervision     Milestones to be reached to achieve recommendation: none  Anticipate achievement in n/a sessions    DME Recommended for Discharge: Patient already has needed equipment    Therapy discharge recommendations may change with patient status.  Please refer to most recent note for up-to-date recommendations.    Assessment:   Significant Findings: None    Pt received in the bedside chair, agreeable to OT treatment session. Pt making good progress towards goals, completed shower from seated level today with SBA-minA needed for safety awareness and LE/backside washing tasks. Pt able to complete dressing tasks with supervision following. Good carryover of safety cues throughout. Left in the chair without needs, all questions answered. Pt will continue to benefit from skilled OT to maximize functional independence and address deficits.     Treatment Activities: ADL, functional mobility, education    Educated the patient to role of occupational therapy, plan of care, goals of therapy and safety with mobility and ADLs, home safety.    Plan:    OT Frequency Recommended: 2-3x/wk     Continue plan of care.       Precautions and Contraindications:   Falls     Updated Medical Status/Imaging/Labs:  No results found.  Lab Results   Component Value Date/Time    HGB 11.2 (L) 03/11/2021 03:48 AM    HCT 32.3 (L) 03/11/2021 03:48 AM    K 4.1 03/12/2021 08:48 AM    NA 132 (L) 03/12/2021 08:48 AM    INR 1.0 03/10/2021 03:53 AM    TROPI 0.02 03/09/2021 08:33 PM    TROPI <0.01 03/03/2021 07:03 PM       Subjective: "you are very good. Very good!"   Patient's medical condition is appropriate for Occupational Therapy intervention at this time.  Patient is agreeable to  participation in the therapy session. Nursing clears patient for therapy.    Pain:   Denies     Objective:   Patient is seated in a bedside chair with telemetry in place.  Pt wore mask during therapy session:No      Cognition  A/Ox3  Follows commands   Verbal cues for safety awareness    Functional Mobility  Supine to Sit: NT, received in bedside chair  Sit to Stand: SBA  Transfers: SBA  Functional Mobility: SBA without AD    PMP Activity: Step 6 - Walks in Room    Balance  Static Sitting: good  Dynamic Sitting: good  Static Standing: good  Dynamic Standing: fair+    Self Care and Home Management  Eating: independent  Grooming: SBA at sink  Bathing: SBA-minA for safety, from seated level   UE Dressing: independent  LE Dressing: supervision/SBA from seated   Toileting: supervision    Therapeutic Exercises  With activity    Participation: good  Endurance: good    Patient left with call bell within reach, all needs met, SCDs off, fall mat in place, bed alarm n/a, chair alarm on and all questions answered. RN notified of session outcome and patient response.     Goals:  Time For Goal Achievement: 2 visits  ADL Goals  Patient will groom self: Supervision, at sinkside, 2 visits  Patient will dress lower body: Modified Independent, 2 visits  Pt will complete bathing: Supervision, Stand by Assist, 2 visits     Neuro Re-Ed Goals  Pt will perform dynamic standing balance: Supervision, for 10 minutes, to complete standing ADLs safely, 2 visits     Executive Fucntion Goals  Pt will demonstrate safety: with supervision, to increase ability to complete ADLs, 2 visits                  PPE worn during session: procedural mask and gloves    Tech present: n/a  PPE worn by tech: N/A    Enid Derry, OTR/L  Pager [phone]      Time of Treatment  OT Received On: 03/12/21  Start Time: 1100  Stop Time: 1140  Time Calculation (min): 40 min    Treatment # 1 of 2 visits

## 2021-03-12 NOTE — Discharge Instructions (Signed)
Seizure: New Onset with Unknown Cause (Adult)  You have had a seizure. A seizure happens when a surge of random, uncontrolled electrical activity occurs in the brain. A seizure can have many causes. Often it's not possible to figure out the exact cause of a seizure from a single exam. You might need other tests. Having 1 seizure doesn't mean that you will continue to have seizures. It doesn't mean that you have epilepsy. But until your doctor knows the cause of your seizure, you are at risk for another seizure. Having 1 seizure without a known cause puts you athigher risk of having another seizure, especially in the next 2 years.  Home care  Follow these tips when caring for yourself at home:  Seizures aren't predictable. Don't do anything that might cause danger to you or other people if you have another seizure. Don't drive, ride a bike, climb ladders, or operate dangerous equipment.  Don't take a bath alone. Take a shower instead.  Don't swim alone until your healthcare provider says that you are no longer in danger of having another seizure.  Tell your close friends and relatives about your seizure. Teach them what to do for you if it happens again.  If medicine was prescribed to prevent seizures, take it exactly as directed. It does not work when taken "as needed." Missing doses will increase your risk of having another seizure.  Follow a regular sleep schedule such that you get at least 6 to 8 hours of restful sleep every night. This is especially important when you are sick and have a cold, flu, or another type of infection.  Don't drink alcoholic beverages until your doctor says it's OK. Do not ever use recreational drugs.  Each state has different laws about driving if you have had a seizure. Ask your doctor if you can drive.  For future seizures, if you are alone:  If you feel a seizure coming on, lie down on a bed or on the floor with something soft under your head. This will keep you from falling.  Lie on your left side, not on your back. This will let fluid drain out of your mouth and prevent choking. Be sure you are not near any objects that might injure youduring the seizure. Call 911 if you can.  For future seizures, if someone is with you:  The person should help you get into a safe position. Then they should call 911. The person shouldn't try to force anything in your mouth once the seizure begins. This could harm your teeth or jaw. They should not put their fingers near your mouth. You mayaccidentally bite them.  After a seizure, you may be drowsy or confused. The person should stay with you until you are fully awake. The person shouldn't offer you anything to eat or drink during that time. Call 911 or go to theemergency department.  Follow-up care  Follow up with your healthcare provider as advised.  You may need other tests to help find out what caused your seizure. These tests may include brain wave tests (EEG) or brain scans (MRI or CT scans).  Keep a seizure calendar to record how often you have a seizure.  If you are on anti-seizure medicine, make sure that you use more than 1 type of birth control. Seizure medicine can affect how well birth control pills work, and you could become pregnant.  Let your doctor know if you plan to get pregnant or if you become pregnant.    Don't drink alcohol until your doctor tells you it's OK.  Don't use recreational drugs.  For the safety of yourself and others on the road, some states require that doctors tell the Public Health Department about any adult who is treated for a seizure and is at risk of more seizures. Then the Department of Motor Vehicles will be told. A restriction will be put on your driver's license. This stays until a doctor gives you medical clearance to driveagain. Contact your doctor to find out if your state requires this process.     Important  Don't drive until you have followed up with your healthcare provider and youhave been cleared to  drive.      When to get medical advice  Call your healthcare provider right away if you have any of these:  Another seizure  Fever of 100.4F (38C) or higher, or as directed by your healthcare provider  Abnormal irritability, drowsiness, or confusion  Headache or neck pain that gets worse    [employer] last reviewed this educational content on2/07/2018     2000-2022 [employer]. All rights reserved. This information is not intended as a substitute for professional medical care. Always follow yourhealthcare professional's instructions.

## 2021-03-12 NOTE — Progress Notes (Signed)
Home Health Referral          Referral from Cory Roughen (Case Manager) for home health care upon discharge.    By [employer], the patient has the right to freely choose a home care provider.  Arrangements have been made with:    A company of the patients choosing. We have supplied the patient with a listing of providers in your area who asked to be included and participate in Medicare.         [facility], a home care agency that provides both adult home care       services which is a wholly owned and operated by [employer] and participates in [employer]    The preferred provider of your insurance company. Choosing a home care provider other than your insurance company's preferred provider may affect your insurance coverage.    The Home Health Care Referral Form acknowledging the voluntary selection of the home care company has been completed, signed, and is on file.      Home Health Discharge Information     Your doctor has ordered Physical Therapy in-home service(s) for you while you recuperate at home, to assist you in the transition from hospital to home.      The agency that you or your representative chose to provide the service:  Name of Home Health Agency Placement: [facility]]  [phone]      The above services were set up by:  Romualdo Bolk, RN  [facility] Liaison)   Phone      [phone]                                 Additional comments:   If you have not heard from your home health agency within 24-48 hours after discharge please call your agency to arrange a time for your first visit.  For any scheduling concerns or questions related to home health, such as time or date please contact your home health agency at the number listed above.     HOME HEALTH REFERRAL    PATIENT DEMOGRAPHICS:        Name: [name]    Discharge Address:note address change  [address] [place] , [place] [address]      Primary Telephone Number:  daughter [phone]  Secondary  Telephone Number:   Emergency Contact and Number: Extended Emergency Contact Information  Primary Emergency Contact: Shute,[name]  Address: [address]           [place], [place] [address] Darden [place] of [place] Phone: [phone]  Relation: Daughter  Preferred language: English  Interpreter needed? No  Secondary Emergency Contact: Smith,Zonia  Address: [address]           [place], [place] [address] Darden Amber of [place]  Mobile Phone: [phone]  Relation: Daughter      Ordering Physician:Phayal, Deanna Artis, MD    PCP: PCP Not on File, MD, None    Following Physician: TCM clinic/John Arnoldo Morale  Agreeable to Follow:   Date/Time of Call:   Spoke With:    Language/Communication Barrier:         No    Primary Diagnosis and Reason for Services:  PT  Diagnosis    Nontraumatic acute subdural hemorrhage    Aphasia    Cerebral edema    Left temporal and right parietal hemorrhagic contusions on MRI 03/04/21  Subdural hematoma    Hyponatremia       [employer] (Labs, Wounds, Infusions, etc.):none      Additional Comments:  NO SOC CALL IS NEEDED    COVID-19 Screening:  SARS CoV 2 Overall Result  COVID-19 (SARS-CoV-2) and Influenza A/B, NAA ([facility])- Admission  Collected: 03/09/21 2033   Result status: Final   Resulting lab: [facility]   Value: Not Detected           Discharge Date: 03/12/21  [facility] 03/13/21  Referral Source (PACC/Hospital/Unit): Romualdo Bolk, [phone]  Referral Date: 03/12/21     Home Health face-to-face (FTF) Encounter (Order [id number])  Consult  Date: 03/12/2021 Department: [facility] [place] [address] [place] Ordering/Authorizing: Andrew Au, MD     Order Information    Order Date/Time Release Date/Time Start Date/Time End Date/Time   03/12/21 02:26 PM None 03/12/21 02:25 PM 03/12/21 02:25 PM     Order Details    Frequency Duration Priority Order Class   Once 1  occurrence Routine Hospital Performed     Standing Order Information    Remaining Occurrences Interval  Last Released     0/1 Once 03/12/2021              Provider Information    Ordering User Ordering Provider Authorizing Provider   Romualdo Bolk, RN Phayal, Deanna Artis, MD Andrew Au, MD   Attending Provider(s) Admitting Provider PCP   Marko Stai, MD; Emeline Darling, MD; Gae Dry, MD; Andrew Au, MD Andrew Au, MD Patsy Lager, MD     Verbal Order Info    Action Created on Order Mode Entered by Responsible Provider Signed by Signed on   Ordering 03/12/21 1426 Telephone with Francoise Ceo, RN Andrew Au, MD             Comments    Diagnosis    Nontraumatic acute subdural hemorrhage    Aphasia    Cerebral edema    Left temporal and right parietal hemorrhagic contusions on MRI 03/04/21    Subdural hematoma    Hyponatremia        Home PT required for gait and balance training, strengthening, mobility, fall prevention, and ADL training.                Home Health face-to-face (FTF) Encounter: Patient Communication     Not Released  Not seen         Order Questions    Question Answer   Date I saw the patient face-to-face: 03/12/2021   Evidence this patient is homebound because: B.  Profound weakness, poor balance/unsteady gait d/t illness/treatment/procedure    F.  Deconditioned due to advance disease process requiring assistance to leave home    N.  Impaired mobility d/t pain, arthritis, weakness that compromises patient safety   Medical conditions that necessitate Home Health care: B.  Functional impairment due to recent hospitalization/procedure/treatment    C.  Risk for complication/infection/pain requiring follow up and monitoring    F.  New diagnosis & treatment requiring follow up monitoring and management   Per clinical findings, following services are medically necessary: PT   Clinical findings that support the need for Physical Therapy. PT will A.  Evaluate and treat functional impairment and improve mobility    C.  Educate on weight bearing status, stair/gait  training, balance & coordination    D.  Provide services to help restore function, mobility, and releive pain    E.  Educate  on functional mobility; bed, chair, sit, stand and transfer activities    F.  Perform home safety assessment & develop safe in home exercise program    G.  Implement activities to improve stance time, cadence & step length    H.  Educate on the safe use of assistive device/ durable medical equipment    I.  Instruct on restorative activities to restore ability to perform ADL   Other (please specify) see comments section for additional information; Dr. Ailene Ards. [place]/ CM clinic will follow patient in the community                    Process Instructions    Please select Home Care Services medically necessary.     Based on the above findings, I certify that this patient is confined to the home and needs intermittent skilled nursing care, physical therapry and / or speech therapy or continues to need occupational therapy. The patient is under my care, and I have initiated the establishment of the plan of care. This patient will be followed by a physician who will periodically review the plan of care.      Collection Information            Consult Order Info    ID Description Priority Start Date Start Time   [id number] Home Health face-to-face (FTF) Encounter Routine 03/12/2021  2:25 PM   Provider Specialty Referred to   ______________________________________ _____________________________________         Acknowledgement Info    For At Acknowledged By Acknowledged On   Placing Order 03/12/21 1426 Bradly Bienenstock, RN 03/12/21 1433                     Verbal Order Info    Action Created on Order Mode Entered by Responsible Provider Signed by Signed on   Ordering 03/12/21 1426 Telephone with Francoise Ceo, RN Andrew Au, MD             Patient Information    Patient Name   Tarissa, Kerin [place] [facility] Legal Sex   Female DOB   07-21-1927       Reprint Order Requisition    Home Health  face-to-face (FTF) Encounter (Order #[id number]) on 03/12/21       Additional Information    Associated Reports External References   Priority and Order Details [employer]          HEALTH SYSTEM  [facility]        Patient Name: AVYN, ADEN     MRN: [id number]      CSN: [id number]         Account Information     Hosp Acct #   [id number] Patient Class   Inpatient Service  Neurology Accommodation Code  Telemetry      Admission Information     Admitting Physician:  Attending Physician: Andrew Au, MD  Andrew Au, MD Unit  [facility]* L&D Status      Admitting Diagnosis: Aphasia Room / Bed  F612/F612.01 L&D - Last Menstrual Cycle      Chief Complaint: Aphasia     Admit Type:  Admit Date/Time:  Discharge Date/Time: Emergency  03/09/2021 / 2028   /  Length of Stay: 1 Days    L&D EDD   Estimated Date of Delivery: None noted.      Patient Information         note address change  [address] [place] ,  [place] [address]     Home Address: [address]  [place] [address] Employer:  Employer Address:       ,     Main Phone: [phone] Employer Phone:     SSN: [id number]       DOB: 04-29-1928 (89 yrs)       Sex: Female Primary Care Physician: Patsy Lager, MD   Marital Status: Unknown Referring Physician:       No ref. provider found   Race: Another Race       Ethnicity: Not of Hispanic/Latino/S*       Emergency Contacts  Name Home Phone Work Phone Mobile Phone Relationship Ivette Loyal     [phone] Daughter No   Joylene Draft     [phone] Daughter           Guarantor Information     Guarantor Name: SHEREENA, BERQUIST Guarantor ID: [id number]   Guarantor Relationship to Pt: Self Guarantor Type: Personal/Family   Guarantor DOB:    1928-06-28       Guarantor Address: [address]   [place], [place] [address]          Guarantor Home Phone: [phone] Guarantor Employer:        Guarantor Work Phone:   Pensions consultant Emp Phone:                     Chief Strategy Officer Name:  [employer] Subscriber Name: [employer]   Insurance Address:    [address]  [place], [place]  [address] Subscriber DOB: 1928-02-12      Subscriber ID: [id number]   Insurance Phone:   Pt Relationship to Sub:   Self   Insurance ID:         Group Name:   Preauthorization #:     Group #:   Preauthorization Days:        Tourist information centre manager Name: OUT OF Conservation officer, nature OUT OF STATE PPO Subscriber Name: [facility]   Insurance Address:    [address] [address]  [place], [place] [address] Subscriber DOB: [birth year]      Subscriber ID: [id number]   Insurance Phone:   Pt Relationship to Sub:   Self   Insurance ID:         Group Name:   Preauthorization #:     Group #: [id number] Preauthorization Days:

## 2021-03-12 NOTE — Discharge Instr - AVS First Page (Addendum)
Reason for your Hospital Admission:  Suspected seizure   Hyponatremia   UTI       Instructions for after your discharge:  -Limit water intake to 1.5 liters a day   -BMP in 1 week -with nephrology/PCP  -Follow up with doctors as recommended   -Ask neurosurgery if she will need a follow up CT head.      Home Health Discharge Information     Your doctor has ordered Physical Therapy in-home service(s) for you while you recuperate at home, to assist you in the transition from hospital to home.      The agency that you or your representative chose to provide the service:  Name of Home Health Agency Placement: [facility]]  [phone]      The above services were set up by:  Romualdo Bolk, RN  [facility] Liaison)   Phone      [phone]                                 Additional comments:   If you have not heard from your home health agency within 24-48 hours after discharge please call your agency to arrange a time for your first visit.  For any scheduling concerns or questions related to home health, such as time or date please contact your home health agency at the number listed above.

## 2021-03-12 NOTE — Plan of Care (Addendum)
NURSING PROGRESS NOTE: STROKE UNIT    Patient Name: [name] (87 y.o. female)  Admission Date: 03/09/2021 [facility] Day 1)      Recent Labs   Lab 03/12/21  0848   Sodium 132*   Potassium 4.1   Chloride 103   CO2 23   BUN 10.0   Creatinine 0.6   EGFR >60.0   Glucose 96   Calcium 8.4       Recent Labs   Lab 03/11/21  0348   WBC 7.50   Hgb 11.2*   Hematocrit 32.3*   Platelets 186         Patient Lines/Drains/Airways Status       Active Lines, Drains and Airways       Name Placement date Placement time Site Days    Peripheral IV 03/09/21 18 G Standard Left Antecubital 03/09/21  2019  Antecubital  2                       Braden Scale Score: 18 (03/12/21 0720)      Skin Integrity: Bruising, Scars  Bruising Skin Location: scattered         Safety Checklist   1:1 Sitter N    Avasys N     ASSESSMENT/PLAN:    Last BM: 8/24    Pending Orders:      Discharge Plan:  Home    POC / Family Update:  Pt's daughter at bedside    Shift Note:  HARD OF HEARING  Pt A/Ox4, slurred speech, FC, MAE- mod strength throughout.  Cont x2.   OOB 1 person assist. Sensation intact.  Tolerating reg diet, thins, pills whole.  Denies pain.  NSR on tele.  HR 70s.     Intermittent R eye twitch noted. Per daughter, twitch has been occurring for a few years but think it's gotten better since starting briviact.   Fall/safety/aspiration/seizure precautions in place.  Will continue to monitor via purposeful rounding.     PPE worn in patient's room:  gloves, mask.   Problem: Neurological Deficit  Goal: Neurological status is stable or improving  Outcome: Progressing  Flowsheets (Taken 03/11/2021 1828)  Neurological status is stable or improving:   Monitor/assess/document neurological assessment (Stroke: every 4 hours)   Monitor/assess NIH Stroke Scale   Re-assess NIH Stroke Scale for any change in status   Observe for seizure activity and initiate seizure precautions if indicated   Perform CAM Assessment     Problem: Potential for Aspiration  Goal:  Risk of aspiration will be minimized  Outcome: Progressing  Flowsheets (Taken 03/11/2021 1828)  Risk of aspiration will be minimized:   Assess and monitor ability to swallow   Monitor/assess for signs of aspiration (tachypnea, cough, wheezing, clearing throat, hoarseness after eating, decrease in SaO2)     Problem: Peripheral Neurovascular Impairment  Goal: Extremity color, movement, sensation are maintained or improved  Outcome: Progressing  Flowsheets (Taken 03/11/2021 1828)  Extremity color, movement, sensation are maintained or improved:   Increase mobility as tolerated/progressive mobility   Assess and monitor application of corrective devices (cast, brace, splint), check skin integrity   Teach/review/reinforce ankle pump exercises   Assess extremity for proper alignment   VTE Prevention: Administer anticoagulant(s) and/or apply anti-embolism stockings/devices as ordered

## 2021-03-12 NOTE — Discharge Summary (Addendum)
[facility] HOSPITALIST DISCHARGE SUMMARY    Date Time: 03/12/21 3:06 PM  Patient Name: [name]  Attending Physician: Andrew Au, MDMD    Date of Admission:   03/09/2021    Date of Discharge:   03/12/2021    Reason for Admission:   Aphasia [R47.01]    Problems:   Lists the present on admission hospital problems  Present on Admission:   Aphasia   Cerebral edema   Left temporal and right parietal hemorrhagic contusions on MRI 03/04/21   Subdural hematoma   Hyponatremia      Problem Lists:  Patient Active Problem List   Diagnosis    Nontraumatic acute subdural hemorrhage    Aphasia    Cerebral edema    Left temporal and right parietal hemorrhagic contusions on MRI 03/04/21    Subdural hematoma    Hyponatremia       Discharge Dx:   Aphasia [R47.01]    Consultations:   Treatment Team: Attending Provider: Andrew Au, MD; Scribe: Dolores Frame; Physical Therapist: Torrie Mayers, PT; Registered Nurse: Bradly Bienenstock, RN; Charge Nurse: Donzetta Sprung, RN; Case Manager: Alverda Skeans, RN    Procedures performed:     Echocardiogram Adult Complete W Clr/ Dopp Waveform   Final Result      MR Angiogram Head WO Contrast   Final Result         Normal MRA brain exam.      Gaylyn Rong, MD    03/10/2021 11:08 AM      MR Angiogram Neck W WO Contrast   Final Result         1. Patent cervical vasculature without aneurysm or dissection. Mild   narrowing in the proximal right cervical vertebral artery.      2. There is no stenosis in the proximal right cervical internal carotid   artery by NASCET criteria.      3. There is no stenosis in the proximal left cervical internal carotid   artery by NASCET criteria.      Gaylyn Rong, MD    03/10/2021 11:42 AM      MRI Brain WO Contrast   Final Result      1. No acute infarct.      2. Evolution and decreased size of hematoma in the lateral left temporal   lobe and smaller hemorrhagic contusion in the right parietal lobe with   surrounding edema as detailed. No interval  hemorrhage compared to the   prior 03/04/2021 MRI      3. Stable left subdural hematoma and sequela of prior subarachnoid   hemorrhage as detailed above.      4. Mild global volume loss, chronic small vessel ischemic changes and   multiple chronic cerebellar infarcts.      Gaylyn Rong, MD    03/10/2021 11:24 AM      CT Head WO Contrast   Final Result    Previously visualized thin left cerebral convexity subdural   hematoma not definitely seen on this examination. The study is otherwise   without significant interval change as above.          Leandro Reasoner, MD    03/09/2021 9:35 PM          Presenting history and hospital Course:   [name] is a 87 y.o. female visiting from [place] hx HTN, HLD, hypothyroid, irregular heartbeat, sternotomy for aortic valve replacement >10 years ago then repeat aortic valve replacement 5-6 years ago (  procedure sounds like TAVR), intracerebral hemorrhage after fall 2021 [facility] [place] [place] - no residual deficit per daughter), trauma admit 8/15-8/18 for left temporal and smaller rigth temporal hemorrhagic contusion + left greater than right SDH (only right SDH is along tentorial leaflet) + IVH who presents to the hospital with expressive and receptive aphasia. She was doing well after the trauma admit until ~10:30 today when at brunch it was difficult to get her attention even when calling out her name. Later she was found sitting with her head down in her hands and again it was difficult to get her attention. When she spoke there was word finding difficulty and some word salad. She appeared to have expressive and receptive aphasia for me. She denies headache. These symptoms are sudden onset, moderate intensity, without alleviating factors.     Her daughter also noted one episode of right hand shaking last admission making it difficult for her to use a spoon. Her daughter has noted infrequent right facial twitching which started prior to the trauma  admission.    #Expressive and receptive aphasia :suspect new onset seizure due to previous brain trauma:  - CT head shows stable left temporal IPH with cerebral edema and left parafalcine/tentorial leaflet SDH not significantly changed from MRI 8/16.   -MRI this admission reveals decreased size of L temporal lobe hematoma and smaller hemorrhagic contusion in the R  parietal lobe; stable L SDH; mild global volume loss  - v EEG>> no seizure.Dced EEG   -C/w Briviact 100 mg BID-started by neurology  given high suspicion for seizure  -Seizure precaution   -Echo given 2 reported aortic valve replacements>> showed severely LA, EF 66% , severely dilated left atrium, mitral valve calcification with mild mitral stenosis s/p TAVR.  -Discussed with nsy -outpatient follow up.  - PT/OT/SLP.     #Hyponatremia :  #SIADH  -Follow. Last admit sodium was 127-132.    -Nephrology consulted-input noted.  -Fluid restriction to 1.5 L a day  -Sodium tablets 1 g 3 times daily with meals.  -Follow up BMP in 1 week      #UTI - Family reports confusion with UTIs before but never presented like this.  - Ceftin + probiotic>> complete 7 day course   -Culture is growing more than 100,000 E. Coli-sensitivity noted      #Hx HTN  #HLD:  -Amlodipine  -Continue with lisinopril.     # H/o hypothyroid:  -Continue with Synthroid        # H/o irregular heartbeat, sternotomy for aortic valve replacement >10 years ago then repeat aortic valve replacement 5-6 years ago (procedure sounds like TAVR)  -Follow up with cardiology outpatient.    # H/o  intracerebral hemorrhage after fall 2021 [facility] [place] - no residual deficit per daughter), trauma admit 8/15-8/18 for left temporal and smaller rigth temporal hemorrhagic contusion + left greater than right SDH (only right SDH is along tentorial leaflet) + IVH - Noted.     Discussed discharge instruction with daughter at the bedside.    Physical exam at discharge:  Vitals:    03/12/21 1150    BP: 123/60   Pulse:    Resp:    Temp:    SpO2:      General: awake, alert, oriented x 3; no acute distress.  HEENT: anicteric sclera   Neck: supple, no lymphadenopathy, no thyromegaly, no JVD, no carotid bruits  Cardiovascular: regular rate and rhythm, no murmurs, rubs or  gallops  Lungs: clear to auscultation bilaterally, without wheezing, rhonchi, or rales  Abdomen: soft, non-tender, non-distended; no palpable masses, no hepatosplenomegaly, normoactive bowel sounds, no rebound or guarding  Extremities: no clubbing, cyanosis, or edema  Neuro: cranial nerves grossly intact, strength 5/5 in upper and lower extremities, sensation intact  Skin: no rashes or lesions noted    Discharge Medications:        Medication List        START taking these medications      brivaracetam 100 MG Tabs tablet  Commonly known as: BRIVIACT  Take 1 tablet (100 mg total) by mouth every 12 (twelve) hours     cefuroxime 250 MG tablet  Commonly known as: CEFTIN  Take 1 tablet (250 mg total) by mouth every 12 (twelve) hours for 4 days     lactobacillus/streptococcus Caps  Take 1 capsule by mouth daily for 5 days  Start taking on: March 13, 2021     sodium chloride 1 g tablet  Take 1 tablet (1 g total) by mouth 3 (three) times daily with meals            CONTINUE taking these medications      acetaminophen 325 MG tablet  Commonly known as: TYLENOL  Take 2 tablets (650 mg total) by mouth every 6 (six) hours     amLODIPine 5 MG tablet  Commonly known as: NORVASC     * digoxin 0.125 MG tablet  Commonly known as: LANOXIN     * digoxin 0.25 MG tablet  Commonly known as: LANOXIN     Fungoid Tincture 2 % Soln  Generic drug: Miconazole Nitrate     levothyroxine 88 MCG tablet  Commonly known as: SYNTHROID     lisinopril 10 MG tablet  Commonly known as: ZESTRIL     raloxifene 60 MG tablet  Commonly known as: EVISTA     rosuvastatin 20 MG tablet  Commonly known as: CRESTOR     urea 40 % cream  Commonly known as: CARMOL           * This list has 2  medication(s) that are the same as other medications prescribed for you. Read the directions carefully, and ask your doctor or other care provider to review them with you.                STOP taking these medications      meloxicam 7.5 MG tablet  Commonly known as: MOBIC               Where to Get Your Medications        These medications were sent to [facility]  [address], [place] [address]      Hours: Monday - Friday 8AM to 8PM, Saturday - Sunday 8AM to 8PM Phone: [phone]   brivaracetam 100 MG Tabs tablet  cefuroxime 250 MG tablet  lactobacillus/streptococcus Caps  sodium chloride 1 g tablet            Discharge Instructions:   Rosalio Macadamia, MD  [address]  [place] [address]  [phone]    Follow up in 3 week(s)      [facility]  [address]  [address]  [place] [address]  [phone]  Follow up      [facility]  [address]  [place] [address]  [phone]  Follow up  Home PT  [name], [place] NP  [address]  [place] [address]  [phone]    Follow up in 1 week(s)      Ernestina Patches, MD  [address]  [place] [address]  [phone]    Follow up in 1 week(s)        TIME SPENT:   Total time taken to go over the discharge instruction to arrange for outpatient medication to discuss the case with consultants was 35 minutes      Signed by: Andrew Au, MD, MD    CC to: Patsy Lager, MD

## 2021-03-12 NOTE — Progress Notes (Signed)
03/12/21 1436   Discharge Disposition   Patient preference/choice provided? N/A   Physical Discharge Disposition Home   Name of Home Health Agency Placement Other (comment box)  (TBD-PACc setting up)   Name of Hospice Agency   (n/a)   Name of DME Agency   (n/a)   Name of Infusion Company Placement   (n/a)   Receiving facility, unit and room number:   (n/a)   Nursing report phone number:   (n/a)   Facility fax number:   (n/a)   Mode of Engineer, water  (son to transport  pt home)   Patient/Family/POA notified of transfer plan Yes;Patient informed only  (pt and son informed)   Patient agreeable to discharge plan/expected d/c date? Yes   Family/POA agreeable to discharge plan/expected d/c date? Yes   Bedside nurse notified of transport plan? Yes   Special requirements for patient during transport:   (n/a)   Outpatient Services   Home Health Home PT/OT/ST   Services   (n/a)   CM Interventions   Follow up appointment scheduled? Yes   Is this a Medicare focused or COVID patient? No   Is this appointment within 48-72 hours? No   Referral made for home health RN visit? No, Other (comment)   Multidisciplinary rounds/family meeting before d/c? Yes   Medicare Checklist   Is this a Medicare patient? Yes   Patient received 1st IMM Letter? Yes   3 midnight inpatient qualifying stay (SNF only) No   If LOS 3 days or greater, did patient received 2nd IMM Letter? n/a

## 2021-03-12 NOTE — Progress Notes (Addendum)
Case Management Initial Assessment and Discharge Planning:         Situation Patient :  [name], [name]  Admission DX:   A/O Status: A/O x   IP days :  Patient was admitted from :    Lace Score:      Background Per H&P: PMH :  87 y.o. female with history of HTN, HLD, prior fall c/b ICH (2020), trauma admit 8/15-8/18 for left temporal and smaller rigth temporal hemorrhagic contusion + left greater than right SDH (only right SDH is along tentorial leaflet) + IVH who now presents to the hospital with expressive and receptive aphasia concerning for seizure.  Residence : Patient is visiting from [facility] and staying w/ her daughter at address : [address] [place], [place] [address]  ADL/IADL'S:  independent to minimal assist   DME: n/a  Home Health: n/a  SNF/AR: n/a  PCP: none here -[place] [facility] apptmnt request at [place] location   Covid 19 vaccination status: vaccinated   Assessment     Patient has no financial needs at this time. T  Discharge Barriers Identified upon initial assessment :  n/a      Recommendation [place] Plan A:  Home with HH PT   [place] Plan B:  Home   CM will continue to follow for [place] planning needs.              03/12/21 1501   Patient Type   Within 30 Days of Previous Admission? Yes   Financial Resource Strain   How hard is it for you to pay for the very basics like food, housing, medical care, and heating? Not hard   Housing Stability   In the last 12 months, was there a time when you were not able to pay the mortgage or rent on time? N   In the last 12 months, how many places have you lived? 1   In the last 12 months, was there a time when you did not have a steady place to sleep or slept in a shelter (including now)? N   Transportation Needs   In the past 12 months, has lack of transportation kept you from medical appointments or from getting medications? no   In the past 12 months, has lack of transportation kept you from meetings, work, or from getting things needed for daily living? No        Cory Roughen, RN, BSN  Clinical Case Manager  [facility]  [phone] a

## 2021-03-12 NOTE — PT Progress Note (Addendum)
Physical Therapy Note    [facility]   Physical Therapy Treatment  Patient:  [name] MRN#:  [id number]  Unit: [facility] TOWER 6 WEST  Bed: F612/F612.01    Post Acute Care Therapy Recommendations:   Discharge Recommendations: Home with supervision, Home with home health PT (initial hands on assist with mobility)     Milestones to be reached to achieve recommendation: none  Anticipate achievement in n/a sessions    DME Recommended for Discharge: Shower chair    If Home with supervision (initial hands on assist with mobiltiy) recommended discharge disposition is not available, patient will need rehab.     Therapy discharge recommendations may change with patient status.  Please refer to most recent note for up-to-date recommendations.    Assessment:   Significant Findings: none     Vitals  BP  HR Circumumstance    Supine  134/68  83  Start of session     Sitting  115/68  90  2 min in position    Standing  119/68  93  2 min in position     Standing   131/72  91  Post ambulation    - No c/o dizziness / malaise       Pt received supine in bed and agreeable to participation with PT session. Able to initiate ambulation in unit halls with SBA. Min carryover with instructions for decreased cadence for max stability with turns. Isolated balance training via lateral side steps with increased SLS - pt able to grossly maintain midline with min lateral sway - no LOB. VC/TC and demo for appropriate sequencing with intermittent reinforcement today. Pt will continue to benefit from acute PT services to maximize safety and independence with mobility prior to discharge.     Assessment: Decreased cognition, Decreased endurance/activity tolerance, Gait impairment, Decreased balance, Decreased functional mobility  Progress: Progressing toward goals  Prognosis: Good, With continued PT status post acute discharge  Risks/Benefits/POC Discussed with Pt/Family: With patient/family    Rehab tech present:  none     Treatment Activities: Gait training     Educated the patient to role of physical therapy, plan of care, goals of therapy and safety with mobility and ADLs.    Plan:   Treatment/Interventions: Gait training, Neuromuscular re-education, Functional transfer training, Patient/family training, Equipment eval/education, Continued evaluation, Exercise    PT Frequency: 3-4x/wk    Continue plan of care.       Precautions and Contraindications:   Falls  Seizures   HOH    Updated Medical Status/Imaging/Labs: Reviewed     Subjective:    "Can I stand up?"  Patient's medical condition is appropriate for Physical Therapy intervention at this time.  Patient is agreeable to participation in the therapy session. Nursing clears patient for therapy.    Pain Assessment: no/denies pain     Objective:   Patient is in bed with PIV access and telemetry in place.    Cognition  Arousal/alertness: Appropriate response to stimuli - with intermittent reinforcement.   Attention span: Distractible   Behavior: Calm , Cooperative , Distractible , and Forgetful     Functional Mobility  Supine to sit: Supervision   Sit to stand: Stand by Assist with No AD   Stand to sit: Stand by Assist with No AD   Cueing required: VC/TC for upright posture, sequencing , LE activation and pausing prior to transitions.     PMP - Progressive Mobility Protocol   PMP Activity:  Step 7 - Walks out of Room  Distance Walked (ft) (Step 6,7): 320 Feet     Ambulation  Distance: see above   Device: No AD   Assist level: Stand by Assist  Pattern: decreased cadence, decreased step length , decreased foot clearance, decreased heel-toe gait, and step through   Cueing required: VC/TC for upright posture, increased step length , increased foot clearance , sequencing , LE activation  - Lateral steps with emphasis on increased foot clearance and SLS time - CGA    Balance  Static sitting: Good  Dynamic sitting: Good  Static Standing: Good  Dynamic standing:  Good-    Participation and Activity Tolerance  Participation effort: Good  Patient endurance: Good    Patient left in bedside chair with OT and daughter present  with call bell within reach, all needs met and all questions answered.  SCDs off as found, fall mat n/a, bed alarm on, chair alarm n/a   RN notified of session outcome and patient response.     PPE worn by therapist: gloves, surgical mask, goggles   PPE worn by patient: none     Goals  Goal Formulation: With patient/family  Time for Goal Acheivement: 5 visits  Goals: Select goal  Pt Will Go Supine To Sit: with supervision, to maximize functional mobility and independence  Pt Will Perform Sit to Stand: with supervision, to maximize functional mobility and independence  Pt Will Ambulate: 101-150 feet, with stand by assist, to maximize functional mobility and independence    Time of Treatment:  PT Received On: 03/12/21  Start Time: 1030  Stop Time: 1105  Time Calculation (min): 35 min  Treatment # 2 out of 5 visits    Torrie Mayers, DPT, [phone]

## 2021-03-12 NOTE — Procedures (Signed)
Continuous Video-EEG Report    Patients name: [name]   Patients date of birth: [birth year]     Date of study: 03/11/21 0800 - 03/11/21 1724    REASON FOR STUDY: eval for seizure; aphasia    HISTORY: Patient is a 87 y.o. female with history of HTN, HLD, prior fall c/b ICH (2020), trauma admit 8/15-8/18 for left temporal and smaller rigth temporal hemorrhagic contusion + left greater than right SDH (only right SDH is along tentorial leaflet) + IVH who now presents to the hospital with expressive and receptive aphasia concerning for seizure.    INTRODUCTION: A Video EEG was performed using the standard international 10/20 Electrode Placement system with the following additional electrode(s): EKG    STATE: awake, asleep  MEDICATIONS: brivaracetam  TECHNICAL PROBLEMS: Excessive muscle artifact at times    DESCRIPTION OF THE RECORD:    Background:  Predominant frequencies: alpha, beta in the right hemisphere; alpha/beta with intermixed theta/delta on the left as below  Posterior dominant rhythm: 9 Hz   Symmetry: asymmetric due to F7/T3 maximal focal delta slowing  Continuity: continuous  Voltage: normal  Variability: present  Reactivity: present  State changes: present  Sleep: stage 1, 2    Periodic Discharges or Rhythmic patterns: Occasional, 1-1.5 Hz, left temporal intermittent rhythmic delta activity (TIRDA), F7/T3 maximal    Sporadic Epileptiform Discharges: none    Seizures: none    IMPRESSION:   Abnormal EEG due to:   1. Occasional left temporal intermittent rhythmic delta activity (TIRDA)   2. Left anterior to midtemporal focal slowing    CLINICAL CORRELATION:  This EEG shows focal cerebral dysfunction in the left anterior to midtemporal region, which is consistent with the known underlying structural abnormality. 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 No electrographic seizures were  seen.    There is no significant change when compared to the prior day's recording.    Diannia Ruder, MD  [facility]  Board Certified in Adult Neurology, Epilepsy, and Clinical Neurophysiology by the ABPN

## 2021-03-12 NOTE — Plan of Care (Signed)
Problem: Moderate/High Fall Risk Score >5  Goal: Patient will remain free of falls  Outcome: Completed     Problem: Safety  Goal: Patient will be free from injury during hospitalization  Outcome: Completed  Goal: Patient will be free from infection during hospitalization  Outcome: Completed     Problem: Pain  Goal: Pain at adequate level as identified by patient  Outcome: Completed     Problem: Side Effects from Pain Analgesia  Goal: Patient will experience minimal side effects of analgesic therapy  Outcome: Completed     Problem: Discharge Barriers  Goal: Patient will be discharged home or other facility with appropriate resources  Outcome: Completed     Problem: Psychosocial and Spiritual Needs  Goal: Demonstrates ability to cope with hospitalization/illness  Outcome: Completed     Problem: Compromised Tissue integrity  Goal: Damaged tissue is healing and protected  Outcome: Completed  Goal: Nutritional status is improving  Outcome: Completed     Problem: Neurological Deficit  Goal: Neurological status is stable or improving  03/12/2021 2049 by Bradly Bienenstock, RN  Outcome: Completed  03/12/2021 1144 by Bradly Bienenstock, RN  Outcome: Progressing  Flowsheets (Taken 03/11/2021 1828)  Neurological status is stable or improving:   Monitor/assess/document neurological assessment (Stroke: every 4 hours)   Monitor/assess NIH Stroke Scale   Re-assess NIH Stroke Scale for any change in status   Observe for seizure activity and initiate seizure precautions if indicated   Perform CAM Assessment     Problem: Potential for Aspiration  Goal: Risk of aspiration will be minimized  03/12/2021 2049 by Bradly Bienenstock, RN  Outcome: Completed  03/12/2021 1144 by Bradly Bienenstock, RN  Outcome: Progressing  Flowsheets (Taken 03/11/2021 1828)  Risk of aspiration will be minimized:   Assess and monitor ability to swallow   Monitor/assess for signs of aspiration (tachypnea, cough, wheezing, clearing throat, hoarseness after eating, decrease in  SaO2)     Problem: Peripheral Neurovascular Impairment  Goal: Extremity color, movement, sensation are maintained or improved  03/12/2021 2049 by Bradly Bienenstock, RN  Outcome: Completed  03/12/2021 1144 by Bradly Bienenstock, RN  Outcome: Progressing  Flowsheets (Taken 03/11/2021 1828)  Extremity color, movement, sensation are maintained or improved:   Increase mobility as tolerated/progressive mobility   Assess and monitor application of corrective devices (cast, brace, splint), check skin integrity   Teach/review/reinforce ankle pump exercises   Assess extremity for proper alignment   VTE Prevention: Administer anticoagulant(s) and/or apply anti-embolism stockings/devices as ordered     Problem: Compromised Hemodynamic Status  Goal: Vital signs and fluid balance maintained/improved  Outcome: Completed     Problem: Impaired Mobility  Goal: Mobility/Activity is maintained at optimal level for patient  Outcome: Completed     Problem: Communication Impairment  Goal: Will be able to express needs and understand communication  Outcome: Completed     Problem: Nutrition  Goal: Nutritional intake is adequate  Outcome: Completed     Problem: Anxiety  Goal: Anxiety is at a manageable level  Outcome: Completed

## 2021-03-12 NOTE — Progress Notes (Signed)
Discharge Note:  HARD OF HEARING  Pt A/Ox4, slurred speech, FC, MAE- mod strength throughout.  Cont x2.   OOB 1 person assist. Sensation intact.  Tolerating reg diet, thins, pills whole.  Denies pain.  NSR on tele.  HR 70s.     Intermittent R eye twitch noted. Per daughter, twitch has been occurring for a few years but think it's gotten better since starting briviact.     Discharge teaching reviewed at length w/ Charge RN, Patient, and Patient's son at bedside.  Education provided.  All questions answered.  PIV and tele removed.      Pt d/c w/ all belongings to include hearing aids. Transported to lobby via wheelchair and transported home via son's vehicle.

## 2021-03-12 NOTE — Progress Notes (Signed)
[facility] PROGRESS NOTE  [id number]      Date Time: 03/12/21 9:31 AM  Patient Name: [name]  Attending Physician: Andrew Au, MD    CC: follow-up     Assessment:     Ms. 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 Improving on salt tablets.     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     Elsworth Soho, BS  [facility]  [phone] (office)      Subjective:     Denies HA, vision changes, CP, SOB, abdominal pain, LE swelling. No complaints.      Review of Systems:   Review of Systems - as above       Physical Exam:     Vitals:    03/11/21 2358 03/12/21 0318 03/12/21 0726 03/12/21 0856   BP: 138/74 125/71 110/68 131/71   Pulse: 75 77 79    Resp: 17 17 16     Temp: 97.2 F (36.2 C) 97.7 F (36.5 C) 97.9 F (36.6 C)    TempSrc: Oral Oral Oral    SpO2: 99% 97% 96%    Weight:       Height:           Intake and Output Summary (Last 24 hours) at Date Time  No intake or output data in the 24 hours ending 03/12/21 0931    General: awake, alert, oriented x 3, no acute distress.  Cardiovascular: regular rate and rhythm, no murmurs, rubs or gallops  Lungs: clear to auscultation bilaterally, without wheezing, rhonchi, or rales  Abdomen: soft, non-tender, non-distended, normoactive bowel sounds  Extremities: no clubbing,  cyanosis, or edema    Meds:      Scheduled Meds: PRN Meds:    amLODIPine, 2.5 mg, Oral, QHS  amLODIPine, 5 mg, Oral, Daily  brivaracetam, 100 mg, Oral, Q12H SCH  cefuroxime, 250 mg, Oral, Q12H SCH  digoxin, 0.125 mg, Oral, Once per day on Sun Tue Thu Sat  digoxin, 0.25 mg, Oral, Q Mon/Wed/Fri (AM)  lactobacillus/streptococcus, 1 capsule, Oral, Daily  levothyroxine, 88 mcg, Oral, Daily  lisinopril, 10 mg, Oral, Daily  raloxifene, 60 mg, Oral, Daily  rosuvastatin, 20 mg, Oral, QHS  sodium chloride, 1 g, Oral, TID MEALS          Continuous Infusions:   acetaminophen, 650 mg, Q6H PRN  famotidine, 20 mg, Daily PRN  hydrALAZINE, 5 mg, Q2H PRN  labetalol, 10 mg, Q2H PRN  midazolam, 2 mg, Q10 Min PRN  ondansetron, 4 mg, Q6H PRN   Or  ondansetron, 4 mg, Q6H PRN  senna-docusate, 2 tablet, BID PRN  sodium chloride, 500 mL, Q30 Min PRN              Labs:  Recent Labs   Lab 03/11/21  0348 03/10/21  0353 03/09/21  2033   WBC 7.50 8.08 9.88*   Hgb 11.2* 12.2 12.5   Hematocrit 32.3* 34.8 35.7   Platelets 186 190 214     Recent Labs   Lab 03/12/21  0848 03/11/21  0348 03/10/21  0353 03/09/21  2033   Sodium 132* 128* 128* 127*   Potassium 4.1 3.9 3.5 3.8   Chloride 103 100 97* 94*   CO2 23 22 24 25    BUN 10.0 13.0 10.0 13.0   Creatinine 0.6 0.7 0.7 0.8   Calcium 8.4 8.5 9.0 9.4   Albumin  --   --   --  4.3   Glucose 96 88 131* 106*   EGFR >60.0 >60.0 >60.0 >60.0       Recent Labs   Lab 03/09/21  2229   Urine Type Urine, Clean Ca   Color, UA Yellow   Clarity, UA Clear   Specific Gravity UA 1.009   Urine pH 6.5   Nitrite, UA Negative   Ketones UA Negative   Urobilinogen, UA Normal   Bilirubin, UA Negative   Blood, UA Negative   RBC, UA 0-2   WBC, UA 11-25*     Recent Labs   Lab 03/10/21  2048   Urine Sodium Random 78   Urine Osmolality 384       Imaging personally reviewed, including: No results found.        Signed by: Elsworth Soho, BS

## 2021-03-13 ENCOUNTER — Ambulatory Visit (INDEPENDENT_AMBULATORY_CARE_PROVIDER_SITE_OTHER): Payer: Medicare Other | Admitting: Nurse Practitioner

## 2021-03-13 ENCOUNTER — Telehealth (INDEPENDENT_AMBULATORY_CARE_PROVIDER_SITE_OTHER): Payer: Self-pay | Admitting: Internal Medicine

## 2021-03-13 NOTE — Telephone Encounter (Addendum)
TELEPHONE DOCUMENTATION FOR POTENTIAL ADMISSION    REFERRAL INFORMATION     Caller: Daughter, [name]   Does patient have a DDM? (Dedicated Decision Maker/POA): Joylene Draft other daughter   Referral Source: [facility], [facility]    Confirm Address: Confirmed   Patient's geographic Provider? [place], [place]  What is the best number to call? (bold this # in the chart)  [phone]  ENROLLMENT INFORMATION     Do you have any form of insurance? If yes, confirm and verify what type. Yes     When did the patient stop driving and why?  Over six months   Is the patient on a ventilator?  No  Are guns and pets secured?  NO, dog   Who is current PCP? None   Does patient agree to change Primary Care Provider to [facility] Provider? Yes   What Specialists does pt see regularly? Neurologist, cardiology and urologist   Please request last 2 visit notes from all non [facility] Providers be faxed to our office. (Provide Fax #)  Does pt currently take Coumadin or Warfarin? No   Does pt currently take prescription pain medication? Yes   Does the patient have capacity to make their own decisions and sign consent forms? Yes   Will the DDM (Dedicated Decision Maker/POA) be present for Admission visit? Yes   Can you electronically receive and print the Admission Consents and Guidelines or should they dropped in the mail? Email   E-mail address:  [email or web address]  Can you electronically return the Admission Consents and Guidelines?     Tell Patient/Caller    >Provider visit will be scheduled once signed Admissions and Consents (and [facility]) are received.   >Review Program Guidelines     APPOINTMENT (Checklist for office.)     Added to Projected Admissions EPIC list?: Yes   Call Routed to [facility] INTAKE POOL: Yes

## 2021-03-14 ENCOUNTER — Telehealth (INDEPENDENT_AMBULATORY_CARE_PROVIDER_SITE_OTHER): Payer: Self-pay

## 2021-03-14 NOTE — Telephone Encounter (Signed)
I spoke with daughter regarding care and initial eval at [facility], patient is pending [employer] and is send medical records from [place] to them. She declined visit at this time as she has refills, and is coordinating neurology and cardiology appts. She is insured dual and will est PCP with [facility].    Home health nurse called to state family pushed SOC to next week of 03/17/21 from [facility]. Dr. Rachel Moulds signed orders.    [name] N. Noreene Larsson, Animator Clinical Case Naval architect for [employer]   [address] [place], [address]   [place], [place] [address]  [phone] F [phone]

## 2021-03-17 ENCOUNTER — Encounter (INDEPENDENT_AMBULATORY_CARE_PROVIDER_SITE_OTHER): Payer: Self-pay | Admitting: Internal Medicine

## 2021-03-18 ENCOUNTER — Encounter (INDEPENDENT_AMBULATORY_CARE_PROVIDER_SITE_OTHER): Payer: Self-pay

## 2021-03-18 ENCOUNTER — Telehealth (INDEPENDENT_AMBULATORY_CARE_PROVIDER_SITE_OTHER): Payer: Medicare Other | Admitting: Family

## 2021-03-18 ENCOUNTER — Encounter (INDEPENDENT_AMBULATORY_CARE_PROVIDER_SITE_OTHER): Payer: Self-pay | Admitting: Internal Medicine

## 2021-03-18 DIAGNOSIS — R4701 Aphasia: Secondary | ICD-10-CM

## 2021-03-18 DIAGNOSIS — E871 Hypo-osmolality and hyponatremia: Secondary | ICD-10-CM

## 2021-03-18 DIAGNOSIS — I6201 Nontraumatic acute subdural hemorrhage: Secondary | ICD-10-CM

## 2021-03-18 DIAGNOSIS — S06359A Traumatic hemorrhage of left cerebrum with loss of consciousness of unspecified duration, initial encounter: Secondary | ICD-10-CM

## 2021-03-18 NOTE — Progress Notes (Signed)
Originating site (Patient location): Home  Distant site (Provider location): Office  Mode of communication: Video  Verbal consent has been obtained: Yes  Patient understands [facility] is designed to provide temporary medical care after a hospital or ER visit while transitioning them to a permanent medical home in the community:  Yes    Language Spoken: English  Interpreter ID#:  n/a, fluent in patient's spoken language    History of Present Illness:   This patient is a 87 y.o. female with a PMHx of  HTN, HLD, hypothyroid, irregular heartbeat, sternotomy for aortic valve replacement >10 years ago then repeat aortic valve replacement 5-6 years ago (procedure sounds like TAVR), intracerebral hemorrhage after fall 2021 [facility] [place] - no residual deficit per daughter), recently admitted to [facility] 08/15-08/18 for fall resulting in IPH and SDH who presented again to the ED on 03/09/21 with expressive and receptive aphasia.CT head showed stable left temporal IPH with cerebral edema and left parafalcine/tentorial leaflet SDH not significantly changed from MRI 8/16.     Video visit conducted with patient and daughter [name]. Reported intermittent headache relieved with Tylenol. Speech improved.  No seizure activity. Denies blurred vision, new weakness/numbness in arms or legs, nausea, vomiting, fever, chills, chest pain, palpitation, shortness of breath, cough, change in urinary/bowel habit (continent).    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 Will repeat BMP.    Receiving PT/OT 2/week    Past Medical History:   Diagnosis Date    Bleeding in head following injury with loss of consciousness     a year ago    Hyperlipidemia     Hypertension     Hypothyroidism     Irregular heartbeat      Past Surgical History:   Procedure Laterality Date    AORTIC VALVE REPLACEMENT      first AVR required sternotomy, >10 years ago per patient    BACK SURGERY      HYSTERECTOMY      REPLACEMENT TOTAL KNEE Bilateral     left  total, right "half knee" replacement    TRANSCATHETER AORTIC VALVE REPLACEMENT  2016    5-6 years ago per patient    WRIST SURGERY Left 2017     Family History   Problem Relation Age of Onset    Stroke Neg Hx     Intracerebral hemorrhage Neg Hx      Social History     Tobacco Use    Smoking status: Never    Smokeless tobacco: Never   Vaping Use    Vaping Use: Never used   Substance Use Topics    Alcohol use: Yes     Alcohol/week: 3.0 standard drinks     Types: 3 Glasses of wine per week     Comment: 1-2 a week    Drug use: Never       Allergies:     Allergies   Allergen Reactions    Codeine Nausea And Vomiting       Medications:     Current Outpatient Medications:     acetaminophen (TYLENOL) 325 MG tablet, Take 2 tablets (650 mg total) by mouth every 6 (six) hours, Disp: , Rfl:     amLODIPine (NORVASC) 5 MG tablet, 5 mg Takes 0.5 tab of 10mg  tab. Total of 5mg  taken per night., Disp: , Rfl:     brivaracetam (BRIVIACT) 100 MG Tab tablet, Take 1 tablet (100 mg total) by mouth every 12 (twelve) hours, Disp: 60 tablet, Rfl:  0    digoxin (LANOXIN) 0.125 MG tablet, Take 125 mcg by mouth Once daily every Sunday, Tuesday, Thursday and Saturday evening, Disp: , Rfl:     digoxin (LANOXIN) 0.25 MG tablet, Take 250 mcg by mouth Once every Monday, Wednesday and Friday morning, Disp: , Rfl:     Fungoid Tincture 2 % Solution, APPLY TWICE A DAY TO TOE NAIL, Disp: , Rfl:     lactobacillus/streptococcus (RISAQUAD) Cap, Take 1 capsule by mouth daily for 5 days, Disp: 5 capsule, Rfl: 0    levothyroxine (SYNTHROID) 88 MCG tablet, Take 88 mcg by mouth every morning, Disp: , Rfl:     lisinopril (ZESTRIL) 10 MG tablet, TAKE 1 TABLET ORALLY ONCE A DAY EVERY MORNING FOR 30 DAY(S), Disp: , Rfl:     raloxifene (EVISTA) 60 MG tablet, Take 60 mg by mouth daily, Disp: , Rfl:     rosuvastatin (CRESTOR) 20 MG tablet, Take 20 mg by mouth nightly, Disp: , Rfl:     sodium chloride 1 g tablet, Take 1 tablet (1 g total) by mouth 3 (three) times daily  with meals, Disp: 21 tablet, Rfl: 0    urea (CARMOL) 40 % cream, APPLY 1 GRAM ONCE A DAY TO NAILS AND CALLUSES, Disp: , Rfl:     Physical Exam:   There were no vitals taken for this visit.    Physical Exam  General: awake, alert, oriented x 3  HEENT: perrla, eomi, sclera anicteric, oropharynx clear without lesions, mucous membranes moist  Neck: supple, no lymphadenopathy, no thyromegaly, no JVD, no carotid bruits  Cardiovascular: S1, S2, regular rate and rhythm, no murmurs, rubs, or gallops  Lungs: clear to auscultation bilaterally, without wheezing, rhonchi, or rales  Abdomen: soft, non tender, normal bowel sounds  Extremities: no clubbing, cyanosis, or edema  Skin: no rashes or lesions noted    Diagnostics:     Lab Results   Component Value Date    WBC 7.50 03/11/2021    HGB 11.2 (L) 03/11/2021    HCT 32.3 (L) 03/11/2021    PLT 186 03/11/2021    CHOL 121 03/10/2021    TRIG 96 03/10/2021    HDL 35 (L) 03/10/2021    LDL 67 03/10/2021    ALT 21 03/09/2021    AST 29 03/09/2021    NA 132 (L) 03/12/2021    K 4.1 03/12/2021    CL 103 03/12/2021    CREAT 0.6 03/12/2021    BUN 10.0 03/12/2021    CO2 23 03/12/2021    TSH 1.08 03/11/2021    INR 1.0 03/10/2021    GLU 96 03/12/2021    HGBA1C 5.6 03/10/2021       No results found for: GLUCOSEWHOLE    CT Head WO Contrast    Result Date: 03/09/2021   Previously visualized thin left cerebral convexity subdural hematoma not definitely seen on this examination. The study is otherwise without significant interval change as above.  Leandro Reasoner, MD  03/09/2021 9:35 PM    CT Head without Contrast    Result Date: 03/03/2021  1. Small acute left subdural hematoma overlying the left frontal parietal lobe. 2. Ovoid hyperdense masslike nodule in the left parietotemporal lobe. Differential considerations include mass versus focal evolving intrapedicle hemorrhage. Recommend further evaluation with contrast-enhanced MRI. Findings were discussed with Delorise Shiner unit on 03/03/2021 at 7:40 PM Wyatt Portela, MD  03/03/2021 7:59 PM    CT Cervical Spine without Contrast    Result Date: 03/03/2021  1. No acute bony abnormality of the cervical spine. 2. Straightening cervical spine may relate to position or spasm. 3. Degenerative changes of the lower cervical spine. Wyatt Portela, MD  03/03/2021 7:39 PM    MR Angiogram Head WO Contrast    Result Date: 03/10/2021  Normal MRA brain exam. Gaylyn Rong, MD  03/10/2021 11:08 AM    MR Angiogram Neck W WO Contrast    Result Date: 03/10/2021  1. Patent cervical vasculature without aneurysm or dissection. Mild narrowing in the proximal right cervical vertebral artery. 2. There is no stenosis in the proximal right cervical internal carotid artery by NASCET criteria. 3. There is no stenosis in the proximal left cervical internal carotid artery by NASCET criteria. Gaylyn Rong, MD  03/10/2021 11:42 AM    MRI Brain WO Contrast    Result Date: 03/10/2021  1. No acute infarct. 2. Evolution and decreased size of hematoma in the lateral left temporal lobe and smaller hemorrhagic contusion in the right parietal lobe with surrounding edema as detailed. No interval hemorrhage compared to the prior 03/04/2021 MRI 3. Stable left subdural hematoma and sequela of prior subarachnoid hemorrhage as detailed above. 4. Mild global volume loss, chronic small vessel ischemic changes and multiple chronic cerebellar infarcts. Gaylyn Rong, MD  03/10/2021 11:24 AM    MRI Brain W WO Contrast    Result Date: 03/04/2021  1. Hemorrhagic contusion with hematoma in the lateral left temporal lobe and smaller hemorrhagic contusion in the right parietal lobe, with surrounding edema as detailed above. 2. Additional multifocal left greater than right subdural, left subarachnoid and intraventricular hemorrhage as detailed. No midline shift. 3. Mild smooth diffuse pachymeningeal enhancement. This finding could be seen in the setting of intracranial hypotension, versus other meningeal process. Correlate  clinically, with CSF sampling if indicated. 4. Background mild global volume loss, chronic small vessel ischemic changes and multiple chronic cerebellar infarcts. Gaylyn Rong, MD  03/04/2021 9:55 PM       Active Problems:     Patient Active Problem List   Diagnosis    Nontraumatic acute subdural hemorrhage    Aphasia    Cerebral edema    Left temporal and right parietal hemorrhagic contusions on MRI 03/04/21    Subdural hematoma    Hyponatremia       Assessment/Plan:   87 y.o. female     There are no diagnoses linked to this encounter.      Hyponatremia    Nontraumatic acute subdural hemorrhage    Aphasia    Traumatic hemorrhage of left cerebrum with loss of consciousness, initial encounter    -Continue Briviact 100 mg Q12HR  -Seizure precautions   -Continue Amlodipine 5 mg QD  -Continue Lisinopril 10 mg QD  -Continue with Synthroid 88 mcg QAM  -Fluid restriction to 1.5 L a day  -Sodium tablets 1 g 3 times daily with meals  -Continue PT/OT  -Repeat BMP   -CT Head 03/21/21 @ 2:45pm  -Follow up with Neurologist 04/11/21 @ 1:00pm  -Follow up with Cardiologist 04/17/21 @ 1:30 pm  -Follow up with Neurosurgeon     Hyponatremia  - Basic Metabolic Panel; Future     Patient in the process of establishing with [facility] calls. Appointment scheduled for 04/18/21 @ 9:00am.     Follow up: 2 weeks  Discharged from [facility]?:  PRN  Medical Home Referred to/Referred Back to: [facility]

## 2021-03-18 NOTE — Progress Notes (Signed)
Sent MyChart message to patient / daughter with attached lab order.    Patient's daughter will not schedule follow up appointment until results are posted and follow is needed.    Ethelda Chick CCMA  [facility]  [address]  [place] [address]  [phone]   [phone]

## 2021-03-21 ENCOUNTER — Ambulatory Visit
Admission: RE | Admit: 2021-03-21 | Discharge: 2021-03-21 | Disposition: A | Discharge status: Home / Self Care | Payer: Medicare Other | Source: Ambulatory Visit | Attending: Nurse Practitioner | Admitting: Nurse Practitioner

## 2021-03-21 ENCOUNTER — Ambulatory Visit
Admission: RE | Admit: 2021-03-21 | Discharge: 2021-03-21 | Disposition: A | Discharge status: Home / Self Care | Payer: Medicare Other | Source: Ambulatory Visit | Attending: Family | Admitting: Family

## 2021-03-21 ENCOUNTER — Ambulatory Visit: Payer: Medicare Other

## 2021-03-21 DIAGNOSIS — E871 Hypo-osmolality and hyponatremia: Secondary | ICD-10-CM | POA: Insufficient documentation

## 2021-03-21 DIAGNOSIS — I6201 Nontraumatic acute subdural hemorrhage: Secondary | ICD-10-CM | POA: Insufficient documentation

## 2021-03-21 LAB — BASIC METABOLIC PANEL
Anion Gap: 5 (ref 5.0–15.0)
BUN: 15 mg/dL (ref 7.0–19.0)
CO2: 29 mEq/L (ref 21–29)
Calcium: 9.4 mg/dL (ref 7.9–10.2)
Chloride: 101 mEq/L (ref 100–111)
Creatinine: 0.8 mg/dL (ref 0.4–1.5)
Glucose: 110 mg/dL — ABNORMAL HIGH (ref 70–100)
Potassium: 4 mEq/L (ref 3.5–5.1)
Sodium: 135 mEq/L — ABNORMAL LOW (ref 136–145)

## 2021-03-21 LAB — HEMOLYSIS INDEX: Hemolysis Index: 8 Index (ref 0–24)

## 2021-03-21 LAB — GFR: EGFR: >60

## 2021-03-21 NOTE — Retrospective Coding Query (Signed)
PHYSICIAN'S DOCUMENTATION                                                                      REQUEST                                                                         Date of Request:  03/21/2021  Type of Request:  DOCUMENTATION CLARIFICATION                                         Patient Name: [name], [name]  Account #: [id number]  MR #: [id number]  Discharge Date: 03/12/2021      Dear Dr. Wilburt Finlay     Questions to Physician X2     The medical record reflects the following:       1)  Patient was admitted to Observation Status with expressive and receptive aphasia; recent head trauma with left temporal and smaller right temporal hemorrhagic contusion + left greater than right SDH (only right SDH is along tentorial leaflet).     Patient was changed to Inpatient Status 03/11/21     Please clarify the reason/indication patient was changed from Observation to Inpatient status     2) Your patient has the documented diagnosis of suspect new onset seizure in DCS.  Additional clarification regarding the etiology/cause of the suspected seizure is requested.  Patient history/risk factors: 87 y.o. female was a trauma admit 8/15-8/18 for left temporal and smaller right temporal hemorrhagic  contusion + left greater than right SDH (only right SDH is along tentorial leaflet) + IVH who presents to the hospital with expressive and receptive aphasia.   CT head shows stable left temporal IPH with cerebral edema and left parafalcine/tentorial leaflet SDH not significantly changed from MRI 8/16.     03/12/2021 Nephro consult: Hyponatremia, moderate, symptomatic: improving. Most likely SIADH in setting of recent brain contusion with component of low salt intake in diet vs less likely primary polydipsia        Clinical Indicators:   Radiology: 08/22 Brain MRI: Stable left subdural hematoma and sequela of prior subarachnoid hemorrhage  08/24 vEEG: EEG shows focal cerebral  dysfunction in the left anterior to midtemporal region, which is consistent with the known underlying structural abnormality.No electrographic seizures were seen.  Labs: Na- 127-132     Treatment:  Medication: SIADH- 2 g salt tablets TID   Consults: Neuro and Nephro  Other Treatment: SIADH- Fluid Restriction and Check Na level daily   Briviact 100 mg BID-started by neurology  given high suspicion for seizure     Please provide additional clarification regarding the etiology/cause of the [insert symptom]:     Suspected seizures due to previous brain trauma  Suspected seizures due to SIADH  Other, please specify ______  Unable to determine        PHYSICIAN RESPONSE:  1) suspected seizure due to previous brain trauma                  Coder  Benjamine Mola  Date     03/21/2021

## 2021-03-21 NOTE — Retrospective Coding Query (Deleted)
PHYSICIAN'S DOCUMENTATION                                                                      REQUEST                                                                         Date of Request:  03/21/2021  Type of Request:  DOCUMENTATION CLARIFICATION                                         Patient Name: [name], [name]  Account #: [id number]  MR #: [id number]  Discharge Date: 03/12/2021      Dear Dr. Wilburt Finlay    Questions to Physician X2    The medical record reflects the following:      1)  Patient was admitted to Observation Status with expressive and receptive aphasia; recent head trauma with left temporal and smaller right temporal hemorrhagic contusion + left greater than right SDH (only right SDH is along tentorial leaflet).    Patient was changed to Inpatient Status 03/11/21    Please clarify the reason/indication patient was changed from Observation to Inpatient status    2) Your patient has the documented diagnosis of suspect new onset seizure in DCS.  Additional clarification regarding the etiology/cause of the suspected seizure is requested.  Patient history/risk factors: 87 y.o. female was a trauma admit 8/15-8/18 for left temporal and smaller right temporal hemorrhagic  contusion + left greater than right SDH (only right SDH is along tentorial leaflet) + IVH who presents to the hospital with expressive and receptive aphasia.   CT head shows stable left temporal IPH with cerebral edema and left parafalcine/tentorial leaflet SDH not significantly changed from MRI 8/16.    03/12/2021 Nephro consult: Hyponatremia, moderate, symptomatic: improving. Most likely SIADH in setting of recent brain contusion with component of low salt intake in diet vs less likely primary polydipsia    Clinical Indicators:   Radiology: 08/22 Brain MRI: Stable left subdural hematoma and sequela of prior subarachnoid hemorrhage  08/24 vEEG: EEG shows focal cerebral dysfunction in the  left anterior to midtemporal region, which is consistent with the known underlying structural abnormality.No electrographic seizures were seen.  Labs: Na- 127-132    Treatment:  Medication: SIADH- 2 g salt tablets TID   Consults: Neuro and Nephro  Other Treatment: SIADH- Fluid Restriction and Check Na level daily  Briviact 100 mg BID-started by neurology  given high suspicion for seizure        Please provide additional clarification regarding the etiology/cause of the [insert symptom]:    Suspected seizures due to previous brain trauma  Suspected seizures due to SIADH  Other, please specify ______              Unable to determine      PHYSICIAN RESPONSE:      1)  2)          Coder Benjamine Mola  Date 03/21/2021

## 2021-03-26 NOTE — Progress Notes (Signed)
Initiated recording on 03/10/21 and discontinued on 03/11/21  Video was captured  Breakout = ___ hours  Unmonitored for 24 hours

## 2021-04-03 ENCOUNTER — Inpatient Hospital Stay (HOSPITAL_BASED_OUTPATIENT_CLINIC_OR_DEPARTMENT_OTHER): Payer: Medicare Other | Admitting: Nurse Practitioner

## 2021-04-05 ENCOUNTER — Encounter (INDEPENDENT_AMBULATORY_CARE_PROVIDER_SITE_OTHER): Payer: Self-pay | Admitting: Internal Medicine

## 2021-04-10 ENCOUNTER — Ambulatory Visit (INDEPENDENT_AMBULATORY_CARE_PROVIDER_SITE_OTHER): Payer: Medicare Other | Admitting: Nurse Practitioner

## 2021-04-10 VITALS — BP 127/73 | HR 82 | Resp 18 | Ht 63.0 in | Wt 135.0 lb

## 2021-04-10 DIAGNOSIS — S065XAA Traumatic subdural hemorrhage with loss of consciousness status unknown, initial encounter: Secondary | ICD-10-CM

## 2021-04-10 DIAGNOSIS — S065X9A Traumatic subdural hemorrhage with loss of consciousness of unspecified duration, initial encounter: Secondary | ICD-10-CM

## 2021-04-11 ENCOUNTER — Encounter (INDEPENDENT_AMBULATORY_CARE_PROVIDER_SITE_OTHER): Payer: Self-pay | Admitting: Neurology

## 2021-04-11 ENCOUNTER — Ambulatory Visit (INDEPENDENT_AMBULATORY_CARE_PROVIDER_SITE_OTHER): Payer: Medicare Other | Admitting: Nurse Practitioner

## 2021-04-11 ENCOUNTER — Ambulatory Visit (INDEPENDENT_AMBULATORY_CARE_PROVIDER_SITE_OTHER): Payer: Medicare Other | Admitting: Neurology

## 2021-04-11 ENCOUNTER — Encounter (INDEPENDENT_AMBULATORY_CARE_PROVIDER_SITE_OTHER): Payer: Self-pay

## 2021-04-11 VITALS — BP 131/68 | HR 72 | Ht 63.0 in | Wt 131.4 lb

## 2021-04-11 DIAGNOSIS — S065XAA Traumatic subdural hemorrhage with loss of consciousness status unknown, initial encounter: Secondary | ICD-10-CM

## 2021-04-11 DIAGNOSIS — S065X9A Traumatic subdural hemorrhage with loss of consciousness of unspecified duration, initial encounter: Secondary | ICD-10-CM

## 2021-04-11 DIAGNOSIS — R569 Unspecified convulsions: Secondary | ICD-10-CM

## 2021-04-11 DIAGNOSIS — G936 Cerebral edema: Secondary | ICD-10-CM

## 2021-04-11 MED ORDER — BRIVARACETAM 100 MG PO TABS
100.0000 mg | ORAL_TABLET | Freq: Two times a day (BID) | ORAL | 2 refills | Status: DC
Start: 2021-04-11 — End: 2021-08-06

## 2021-04-11 NOTE — Progress Notes (Signed)
Subjective:       Please see detailed assessment and plan    Patient ID: [name] is a 87 y.o. female visiting from [place] hx HTN, HLD, hypothyroid, irregular heartbeat, sternotomy for aortic valve replacement >10 years ago then repeat aortic valve replacement 5-6 years ago (procedure sounds like TAVR), intracerebral hemorrhage after fall 2021 [facility] [place], [place] - no residual deficit per daughter), trauma admit 8/15-8/18 for left temporal and smaller rigth temporal hemorrhagic contusion + left greater than right SDH (only right SDH is along tentorial leaflet) + IVH  here for Seizures  .    HPI  Background from Dr. Wilburt Finlay discharge note 02/2021 "87 y.o. female visiting from [place] hx HTN, HLD, hypothyroid, irregular heartbeat, sternotomy for aortic valve replacement >10 years ago then repeat aortic valve replacement 5-6 years ago (procedure sounds like TAVR), intracerebral hemorrhage after fall 2021 [facility] [place], [place] - no residual deficit per daughter), trauma admit 8/15-8/18 for left temporal and smaller rigth temporal hemorrhagic contusion + left greater than right SDH (only right SDH is along tentorial leaflet) + IVH who presents to the hospital with expressive and receptive aphasia. She was doing well after the trauma admit until ~10:30 today when at brunch it was difficult to get her attention even when calling out her name. Later she was found sitting with her head down in her hands and again it was difficult to get her attention. When she spoke there was word finding difficulty and some word salad. She appeared to have expressive and receptive aphasia for me. She denies headache. These symptoms are sudden onset, moderate intensity, without alleviating factors.    Her daughter also noted one episode of right hand shaking last admission making it difficult for her to use a spoon. Her daughter has noted infrequent right facial twitching which started prior to the  trauma admission."    Of note: Once patient was placed on briviact, her coomunication issues were gone.  For about 5 years before she was having right sided facial twitching,  which also stopped after starting Briviact.  Next CT scheduled 10/18/ 2022 with followup with neurosurgery..  Mother usually lives in [place] by herself and is here now to stay.  Emphasized no driving for at least 6 months per state law.    cvEEG 03/12/2021  IMPRESSION: Abnormal EEG due to:   1. Occasional left temporal intermittent rhythmic delta activity (TIRDA)   2. Left anterior to midtemporal focal slowing    MRI Brain 03/10/2021  IMPRESSION:   1. No acute infarct.   2. Evolution and decreased size of hematoma in the lateral left temporal lobe and smaller hemorrhagic contusion in the right parietal lobe with surrounding edema as detailed. No interval hemorrhage compared to the prior 03/04/2021 MRI  3. Stable left subdural hematoma and sequela of prior subarachnoid hemorrhage as detailed above.  4. Mild global volume loss, chronic small vessel ischemic changes and multiple chronic cerebellar infarcts.     CT head 03/21/2021  IMPRESSION:    1.  Interval resolution of parenchymal hemorrhage associated with a prior left MCA/temporal lobe infarct since prior noncontrast head CT.   2.  Improved subdural hematoma along the left tentorial leaflet and posterior cerebral falx with only trace residual.   3.  No new hemorrhage or evidence of a new large vascular territory infarct.  4.  Otherwise similar chronic findings, as above, as on prior imaging.    Review of Systems  No other motor, sensory, vision, cognitive, speech, swallow, bowel, or bladder changes; no tremors, convulsions, seizure activity, or loss of consciousness episodes.  All other systems were reviewed and were negative.     Current Outpatient Medications on File Prior to Visit   Medication Sig Dispense Refill    acetaminophen (TYLENOL) 325 MG tablet Take 2 tablets (650 mg total) by mouth  every 6 (six) hours      amLODIPine (NORVASC) 5 MG tablet 5 mg Takes 0.5 tab of 10mg  tab. Total of 5mg  taken per night.      brivaracetam (BRIVIACT) 100 MG Tab tablet Take 1 tablet (100 mg total) by mouth every 12 (twelve) hours 60 tablet 0    digoxin (LANOXIN) 0.125 MG tablet Take 125 mcg by mouth Once daily every Sunday, Tuesday, Thursday and Saturday evening      digoxin (LANOXIN) 0.25 MG tablet Take 250 mcg by mouth Once every Monday, Wednesday and Friday morning      Fungoid Tincture 2 % Solution APPLY TWICE A DAY TO TOE NAIL      levothyroxine (SYNTHROID) 88 MCG tablet Take 88 mcg by mouth every morning      lisinopril (ZESTRIL) 10 MG tablet TAKE 1 TABLET ORALLY ONCE A DAY EVERY MORNING FOR 30 DAY(S)      raloxifene (EVISTA) 60 MG tablet Take 60 mg by mouth daily      rosuvastatin (CRESTOR) 20 MG tablet Take 20 mg by mouth nightly      urea (CARMOL) 40 % cream APPLY 1 GRAM ONCE A DAY TO NAILS AND CALLUSES       No current facility-administered medications on file prior to visit.     Current/Home Medications    ACETAMINOPHEN (TYLENOL) 325 MG TABLET    Take 2 tablets (650 mg total) by mouth every 6 (six) hours    AMLODIPINE (NORVASC) 5 MG TABLET    5 mg Takes 0.5 tab of 10mg  tab. Total of 5mg  taken per night.    BRIVARACETAM (BRIVIACT) 100 MG TAB TABLET    Take 1 tablet (100 mg total) by mouth every 12 (twelve) hours    DIGOXIN (LANOXIN) 0.125 MG TABLET    Take 125 mcg by mouth Once daily every Sunday, Tuesday, Thursday and Saturday evening    DIGOXIN (LANOXIN) 0.25 MG TABLET    Take 250 mcg by mouth Once every Monday, Wednesday and Friday morning    FUNGOID TINCTURE 2 % SOLUTION    APPLY TWICE A DAY TO TOE NAIL    LEVOTHYROXINE (SYNTHROID) 88 MCG TABLET    Take 88 mcg by mouth every morning    LISINOPRIL (ZESTRIL) 10 MG TABLET    TAKE 1 TABLET ORALLY ONCE A DAY EVERY MORNING FOR 30 DAY(S)    RALOXIFENE (EVISTA) 60 MG TABLET    Take 60 mg by mouth daily    ROSUVASTATIN (CRESTOR) 20 MG TABLET    Take 20  mg by mouth nightly    UREA (CARMOL) 40 % CREAM    APPLY 1 GRAM ONCE A DAY TO NAILS AND CALLUSES     Allergies   Allergen Reactions    Codeine Nausea And Vomiting      Patient Active Problem List    Diagnosis Date Noted    Cerebral edema [G93.6] 03/10/2021    Left temporal and right parietal hemorrhagic contusions on MRI 03/04/21 [I61.9] 03/10/2021    Subdural hematoma [S06.5X9A] 03/10/2021    Hyponatremia [E87.1] 03/10/2021    Aphasia [  R47.01] 03/09/2021    Nontraumatic acute subdural hemorrhage [I62.01] 03/03/2021     Past Surgical History:   Procedure Laterality Date    AORTIC VALVE REPLACEMENT      first AVR required sternotomy, >10 years ago per patient    BACK SURGERY      HYSTERECTOMY      REPLACEMENT TOTAL KNEE Bilateral     left total, right "half knee" replacement    TRANSCATHETER AORTIC VALVE REPLACEMENT  2016    5-6 years ago per patient    WRIST SURGERY Left 2017     Social History     Socioeconomic History    Marital status: Widowed   Tobacco Use    Smoking status: Never    Smokeless tobacco: Never   Vaping Use    Vaping Use: Never used   Substance and Sexual Activity    Alcohol use: Not Currently     Alcohol/week: 3.0 standard drinks     Types: 3 Glasses of wine per week     Comment: 1-2 a week    Drug use: Never     Social Determinants of Health     Financial Resource Strain: Low Risk     Difficulty of Paying Living Expenses: Not hard at all   Food Insecurity: No Food Insecurity    Worried About Programme researcher, broadcasting/film/video in the Last Year: Never true    Barista in the Last Year: Never true   Transportation Needs: No Transportation Needs    Lack of Transportation (Medical): No    Lack of Transportation (Non-Medical): No   Physical Activity: Unknown    Days of Exercise per Week: 0 days   Stress: No Stress Concern Present    Feeling of Stress : Not at all   Social Connections: Moderately Integrated    Frequency of Communication with Friends and Family: More than three times a week    Frequency of Social  Gatherings with Friends and Family: More than three times a week    Attends Religious Services: More than 4 times per year    Active Member of [employer] or Organizations: Yes    Attends Banker Meetings: More than 4 times per year    Marital Status: Widowed   Catering manager Violence: Not At Risk    Fear of Current or Ex-Partner: No    Emotionally Abused: No    Physically Abused: No    Sexually Abused: No   Housing Stability: Low Risk     Unable to Pay for Housing in the Last Year: No    Number of Places Lived in the Last Year: 1    Unstable Housing in the Last Year: No     Family History   Problem Relation Age of Onset    Stroke Mother     Anuerysm Mother     Cancer Father     Heart disease Sister     Heart disease Brother     Intracerebral hemorrhage Neg Hx      She  has a past medical history of Bleeding in head following injury with loss of consciousness, Convulsions, Hyperlipidemia, Hypertension, Hypothyroidism, and Irregular heartbeat.        Objective:      Physical Exam Neurologic Exam    Vital Signs:  Reviewed    General: Well developed and well nourished. No acute distress. Cooperative with the exam  ENT: Normal oral mucosa, no ear or nose discharge  Neck:  Symmetric, no deformities  CV: RRR  Resp: No audible wheezing, normal work of breathing  Abd: Soft, nondistended  Skin: Intact, extremities normal in color  Psych: Affect is normal, good insight    Mental Status: The patient is awake, alert and oriented to person, place, and time.  Affect is normal  Fund of knowledge appropriate  Recent and remote memory are intact   Attention span and concentration appear normal.  Language function is normal. There is no evidence of aphasia in conversational speech.    Cranial nerves:   -CN II: Visual fields full to bedside confrontation   -CN III, IV, VI: Pupils equal, round, and reactive to light; extraocular movements intact; no ptosis              -CN V: Facial sensation intact in V1 through V3  distributions   -CN VII: Face symmetric   -CN VIII: Hearing intact to conversational speech   -CN IX, X: Palate elevates symmetrically; normal phonation   -CN XI: Symmetric full strength of sternocleidomastoid and trapezius muscles   -CN XII: Tongue protrudes midline    Motor: Muscle tone normal without spasticity or flaccidity. No atrophy.  No pronator drift.     UEs:   Deltoid Bicep Tricep WE WF Grip IO   Right 5 5 5 5 5 5 5    Left 5 5 5 5 5 5 5      LEs   HF HE KF KE PF DF   Right 5 5 5 5 5 5    Left 5 5 5 5 5 5      Sensory:   Light touch intact.  Temperature intact.  Vibration intact.    Reflexes:      B T BR P A   Right 2 2 2 2 2    Left 2 2 2 2 2      Plantars:    Coordination: FTN intact, no truncal ataxia. RAMs intact. 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 female visiting from [place] hx HTN, HLD, hypothyroid, irregular heartbeat, sternotomy for aortic valve replacement >10 years ago then repeat aortic valve replacement 5-6 years ago (procedure sounds like TAVR), intracerebral hemorrhage after fall 2021 [facility] [place], [place] - no residual deficit per daughter), trauma admit 8/15-8/18 for left temporal and smaller rigth temporal hemorrhagic contusion + left greater than right SDH (only right SDH is along tentorial leaflet) + IVH  here for Seizures    Recommend:  Continue Briviact 100 mg BID refills sent to pharmacy  Recommend PT for gait disturbance  Continue seizure precautions including 6 months driving restriction after last seizure  Follow up with Dr. Becky Sax in 6 months or sooner if needed        Plan:      No orders of the defined types were placed in this encounter.    All relevant and clinical information was  transcribed by me, Norvel Richards. Clemon Chambers, NP, acting as a scribe for Dr. Rosalio Macadamia.    Attending note:   The patient was seen and examined by me. Interval history reviewed. All pertinent parts  of the neurological exam were performed and confirmed by me, with any additional findings on exam noted in bold. I agree with the assessment and plan as outlined by the mid-level provider as above, with any additional considerations or recommendations as noted below.      Agree     Additional notes and data scanned including patient questionnaire which may contain pertinent information to visit. Patient can follow up sooner if needed. In the meantime, patient will contact the office with any questions or concerns.     Total of 60 minutes were spent on the day of service including face-to-face time with patient, coordinating care, record review and documentation, explaining the natural history of seizures, types of seizures, triggers for seizures.  Need for the necessary testing.  EEG and MRI.  Need for a seizure medication compliance for medications.  Seizure precautions including avoid driving per [place].  6 months from the last seizure.  Side effects of seizure medications.  Avoiding triggers for seizures.  Backup plan, and the time of partial seizure and convulsive seizures.  Including going to the emergency room.  Taking emergency rescue medications.           Richrd Humbles, MD. FAES    Director, [facility]  Assistant Professor, [facility]  Board Certified,Neurology  Board Certified, Clinical Neurophysiology    [email or web address]      This note was generated by the Epic EMR system/ Dragon speech recognition and may contain inherent errors or omissions not intended by the user. Grammatical errors, random word insertions, deletions, pronoun errors and incomplete sentences are occasional consequences of this technology due to software  limitations. Not all errors are caught or corrected.Although every attempt is made to root out erroneus and incomplete transcription, the note may still not fully represent the intent or opinion of the author. If there are questions or concerns about the content of this note or information contained within the body of this dictation they should be addressed directly with the author for clarification.*

## 2021-04-14 ENCOUNTER — Encounter (INDEPENDENT_AMBULATORY_CARE_PROVIDER_SITE_OTHER): Payer: Self-pay | Admitting: Nurse Practitioner

## 2021-04-14 NOTE — Progress Notes (Signed)
[facility] Neurosurgery  Follow up Note    Previous Impression/Plan       HPI     Chief Complaint   Patient presents with    Follow-up     Subdural hematoma   89 yoF who fell on 03/03/2021 and sustained SDH. She presented today for follow up. She is doing well. She denies any new symptoms    Physical Examination   VITAL SIGNS:   height is 1.6 m (5\' 3" ) and weight is 61.2 kg (135 lb). Her blood pressure is 127/73 and her pulse is 82. Her respiration is 18.        Neurologic Exam  Awake, alert, oriented x3, Follows commands  GCS: 15 E4 V5 M6  aware of current event   CN II: PERRL  CN III, IV, VI: EOMI, no nystagmus  CN V: Normal sensation in all 3 trigeminal divisions bilaterally  CN VII: Face symmetric  CN VIII: Hearing intact bilaterally  CN XI: Shoulder shrug symmetric  CN XII: Tongue midline  Speech is clear  Attention span and concentration: intact  Recent and remote memory: intact  Pronator drift: Negative  Finger to nose: Normal  Moves BUEs and BLEs 5/5 strength  Sensation intact bilaterlly  Biceps (C5-6): right 2+, left 2+  Brachioradialis C5-6): right  2+ , left 2+,  Patellar (L3-4): right 2+, left 2+,  Clonus: right negative, left negative   Hoffman's: right negative, left negative  Tandem gait: Normal  Romberg: Normal    Review of Systems   Review of Systems  Constitutional: negative for fever and chills  HENT: Negative for neck pain, sore throat  Eyes: negative for eye pain  Respiratory: Negative for shortness of breath  Cardiovascular: Negative for chest pain, chest tightness   Gastrointestinal: Negative for abdominal pain, nausea and vomiting  Genitourinary: Negative for dysuria, frequency   Musculoskeletal: Negative for gait problem, imbalance  Skin: Negative for rash, blisters,  Neurological: Negative for weakness, numbness and tingling  Hematological: Negative for bruising easily  Psychiatric/Behavioral: Negative for confusion,     Radiology Interpretation   CT Head WO Contrast    Result Date:  03/21/2021  CT HEAD WITHOUT CONTRAST HISTORY: Subdural hematoma.. COMPARISON: Multiple priors, including brain MRI from 03/10/2021 and noncontrast head CT from 03/09/2021. TECHNIQUE: Axial CT images were obtained from the skull base to the vertex without administration of iodinated contrast. Reformatted images in the coronal and sagittal planes were generated. CT scanning at this site utilizes multiple dose reduction techniques, including automatic exposure control, adjustment of the mA and/or kV according to patient size, and iterative reconstruction technique. FINDINGS: Calvarium/skull base: No acute fracture or destructive lesions. Unchanged small exostosis from the outer table of left frontal calvarium without aggressive features. Mastoid air cells clear. Degenerative changes of bilateral temporomandibular joints. Orbits: Symmetric. No masses. Bilateral lens replacements. Paranasal sinuses: No air-fluid levels. Small mucus retention cyst incompletely imaged in the right maxillary sinus. Brain: Remote small infarcts in bilateral cerebellar hemispheres. Prior left MCA territory infarct in the left temporal lobe with interval resolution of associated parenchymal hematoma since prior noncontrast head CT. No evidence of a new large vascular territory infarct. Improved subdural hemorrhage along the left tentorial leaflet and posterior cerebral falx with only trace residual. No new hemorrhage. Patchy hypoattenuation in the periventricular and subcortical white matter, likely from chronic small vessel disease. Intracranial arterial calcifications. Generalized, age-congruent parenchymal volume loss. No hydrocephalus. No mass effect or overt mass lesion.  1.  Interval resolution of parenchymal hemorrhage associated with a prior left MCA/temporal lobe infarct since prior noncontrast head CT. 2.  Improved subdural hematoma along the left tentorial leaflet and posterior cerebral falx with only trace residual. 3.  No new  hemorrhage or evidence of a new large vascular territory infarct. 4.  Otherwise similar chronic findings, as above, as on prior imaging. Susan Gentles MD, [name]  03/21/2021 4:00 PM      Impression   89 yoF who fell on 03/03/2021 and sustained SDH. She is doing well, her head CT shows residual SDH on falx.   I recommended to repeat head CT in a month    Plan     Orders Placed This Encounter   Procedures    CT Head WO Contrast     Standing Status:   Future     Standing Expiration Date:   04/10/2022     Order Specific Question:   Clinical info for radiologist     Answer:   SDH     Order Specific Question:   Reason for Exam:     Answer:   SDH     Order Specific Question:   Release to patient     Answer:   Immediate     Follow-up   As needed  Susan Shiner, DNP NP

## 2021-04-17 ENCOUNTER — Ambulatory Visit (INDEPENDENT_AMBULATORY_CARE_PROVIDER_SITE_OTHER): Payer: Medicare Other | Admitting: Cardiology

## 2021-04-17 ENCOUNTER — Encounter (INDEPENDENT_AMBULATORY_CARE_PROVIDER_SITE_OTHER): Payer: Self-pay | Admitting: Cardiology

## 2021-04-17 ENCOUNTER — Encounter (INDEPENDENT_AMBULATORY_CARE_PROVIDER_SITE_OTHER): Payer: Self-pay

## 2021-04-17 VITALS — BP 112/62 | HR 87 | Ht 63.0 in | Wt 133.2 lb

## 2021-04-17 DIAGNOSIS — I10 Essential (primary) hypertension: Secondary | ICD-10-CM

## 2021-04-17 DIAGNOSIS — E785 Hyperlipidemia, unspecified: Secondary | ICD-10-CM

## 2021-04-17 DIAGNOSIS — Z952 Presence of prosthetic heart valve: Secondary | ICD-10-CM

## 2021-04-17 DIAGNOSIS — R55 Syncope and collapse: Secondary | ICD-10-CM

## 2021-04-17 LAB — ECG 12-LEAD
Atrial Rate: 82 {beats}/min
IHS MUSE NARRATIVE AND IMPRESSION: NORMAL
P Axis: 14 degrees
P-R Interval: 146 ms
Q-T Interval: 404 ms
QRS Duration: 102 ms
QTC Calculation (Bezet): 472 ms
R Axis: 43 degrees
T Axis: 16 degrees
Ventricular Rate: 82 {beats}/min

## 2021-04-17 NOTE — Progress Notes (Signed)
[place] [facility] PROGRESS NOTE    I had the pleasure of seeing [name] today for cardiovascular follow up. He is a pleasant 87 y.o. female with a history of bioprosthetic aortic valve replacement for severe aortic stenosis in 2009 who then had TAVR (Medtronic CoreValve) in SAVR 2017 at which time open coronaries were noted at catheterization and who has a history of SVT found with an implantable loop recorder in place, hyperlipidemia, never smoked cigarettes, hypertension, latest echocardiogram done in [place] February 2020 at which time ejection fraction was 70% with moderate LVH, moderate mitral stenosis, mild mitral regurgitation, normal TAVR function who presents after moving to the area from [place] to be near her daughter seeking cardiology follow-up.  The patient is asymptomatic without chest pain shortness of breath syncope or near syncope.    The patient has had 4 falls since July and was in the hospital 2 times in the last fall was 2 weeks ago.  A fall August 2022 led to a subdural hematoma and left temporal and right parietal hemorrhagic contusions on MRI.  At that time the patient developed aphasia.  The patient has had hyponatremia due to SIADH.  Neck MRA showed patent cervical vasculature.  Her latest echocardiogram August 2022 showed normal function of the aortic bioprosthesis normal left ventricular function with ejection fraction 66% and mild calcific mitral stenosis.  The left ventricle was small.  The IVC was normal with normal motion.    MEDICATIONS:  Current Outpatient Medications   Medication Sig Dispense Refill    amLODIPine (NORVASC) 5 MG tablet 5 mg Takes 0.5 tab of 10mg  tab. Total of 5mg  taken per night.      brivaracetam (BRIVIACT) 100 MG Tab tablet Take 1 tablet (100 mg total) by mouth every 12 (twelve) hours 180 tablet 2    digoxin (LANOXIN) 0.125 MG tablet Take 125 mcg by mouth Once daily every Sunday, Tuesday, Thursday and Saturday evening      digoxin (LANOXIN) 0.25 MG tablet  Take 250 mcg by mouth Once every Monday, Wednesday and Friday morning      Fungoid Tincture 2 % Solution APPLY TWICE A DAY TO TOE NAIL      levothyroxine (SYNTHROID) 88 MCG tablet Take 88 mcg by mouth every morning      lisinopril (ZESTRIL) 10 MG tablet TAKE 1 TABLET ORALLY ONCE A DAY EVERY MORNING FOR 30 DAY(S)      raloxifene (EVISTA) 60 MG tablet Take 60 mg by mouth daily      rosuvastatin (CRESTOR) 20 MG tablet Take 20 mg by mouth nightly      urea (CARMOL) 40 % cream APPLY 1 GRAM ONCE A DAY TO NAILS AND CALLUSES           REVIEW OF SYSTEMS: All other systems reviewed and negative except as above.    PHYSICAL EXAMINATION  General Appearance: well-appearing and in no acute distress.   Vital Signs: BP 112/62 (BP Site: Left arm, Patient Position: Sitting, Cuff Size: Medium)    Pulse 87    Ht 1.6 m (5\' 3" )    Wt 60.4 kg (133 lb 3.2 oz)    SpO2 97%    BMI 23.60 kg/m    HEENT: Sclera anicteric, conjunctiva without pallor, moist mucous membranes.  Neck: Supple without jugular venous distention.  Normal carotid upstrokes without bruits.  Chest: Clear to auscultation bilaterally with good air movement and respiratory effort and no wheezes, rales, or rhonchi  Cardiovascular: Normal S1 and  S2 without murmurs, gallops or rub. PMI of normal size and nondisplaced.   Extremities: Warm without edema.     ECG: Normal sinus rhythm right bundle branch block  Past Medical History:   Diagnosis Date    Bleeding in head following injury with loss of consciousness     a year ago    Convulsions     Hyperlipidemia     Hypertension     Hypothyroidism     Irregular heartbeat      Family History   Problem Relation Age of Onset    Stroke Mother     Anuerysm Mother     Cancer Father     Heart disease Sister     Heart disease Brother     Intracerebral hemorrhage Neg Hx      Social History     Socioeconomic History    Marital status: Widowed     Spouse name: None    Number of children: None    Years of education: None    Highest education  level: None   Occupational History    None   Tobacco Use    Smoking status: Never    Smokeless tobacco: Never   Vaping Use    Vaping Use: Never used   Substance and Sexual Activity    Alcohol use: Not Currently     Alcohol/week: 3.0 standard drinks     Types: 3 Glasses of wine per week     Comment: 1-2 a week    Drug use: Never    Sexual activity: None   Other Topics Concern    None   Social History Narrative    None     Social Determinants of Health     Financial Resource Strain: Low Risk     Difficulty of Paying Living Expenses: Not hard at all   Food Insecurity: No Food Insecurity    Worried About Programme researcher, broadcasting/film/video in the Last Year: Never true    Barista in the Last Year: Never true   Transportation Needs: No Transportation Needs    Lack of Transportation (Medical): No    Lack of Transportation (Non-Medical): No   Physical Activity: Unknown    Days of Exercise per Week: 0 days    Minutes of Exercise per Session: Not on file   Stress: No Stress Concern Present    Feeling of Stress : Not at all   Social Connections: Moderately Integrated    Frequency of Communication with Friends and Family: More than three times a week    Frequency of Social Gatherings with Friends and Family: More than three times a week    Attends Religious Services: More than 4 times per year    Active Member of [employer] or Organizations: Yes    Attends Banker Meetings: More than 4 times per year    Marital Status: Widowed   Catering manager Violence: Not At Risk    Fear of Current or Ex-Partner: No    Emotionally Abused: No    Physically Abused: No    Sexually Abused: No   Housing Stability: Low Risk     Unable to Pay for Housing in the Last Year: No    Number of Places Lived in the Last Year: 1    Unstable Housing in the Last Year: No         IMPRESSION:  -S/p SAVR with open coronaries 2009 for severe aortic stenosis  -S/p TAVR  in SAVR 2017 with a Medtronic CoreValve  -History hypertension  -Hypercholesterolemia  -History  of SVT found with an ILR in place  -History of falls as outlined above with in August 2022 fall leading to subdural hematoma as well as left temporal and right parietal hemorrhagic contusions with aphasia.  -Echocardiogram August 2022 with ejection fraction 66% and good TAVR function and mild calcific mitral stenosis.      PLAN:   [place] digoxin  Continue other medications  Check status of ILR and if not functioning place a monitor off digoxin  No other studies required currently  6 months follow-up      Royann Shivers, MD [facility]  04/17/2021

## 2021-04-18 ENCOUNTER — Encounter (INDEPENDENT_AMBULATORY_CARE_PROVIDER_SITE_OTHER): Payer: Self-pay | Admitting: Student in an Organized Health Care Education/Training Program

## 2021-04-18 ENCOUNTER — Ambulatory Visit: Payer: Medicare Other | Admitting: Student in an Organized Health Care Education/Training Program

## 2021-04-18 ENCOUNTER — Encounter (INDEPENDENT_AMBULATORY_CARE_PROVIDER_SITE_OTHER): Payer: Self-pay | Admitting: Cardiology

## 2021-04-18 VITALS — BP 110/55 | HR 74

## 2021-04-18 DIAGNOSIS — E871 Hypo-osmolality and hyponatremia: Secondary | ICD-10-CM

## 2021-04-18 DIAGNOSIS — M810 Age-related osteoporosis without current pathological fracture: Secondary | ICD-10-CM

## 2021-04-18 DIAGNOSIS — R269 Unspecified abnormalities of gait and mobility: Secondary | ICD-10-CM

## 2021-04-18 DIAGNOSIS — I6201 Nontraumatic acute subdural hemorrhage: Secondary | ICD-10-CM

## 2021-04-18 DIAGNOSIS — R4701 Aphasia: Secondary | ICD-10-CM

## 2021-04-18 DIAGNOSIS — E039 Hypothyroidism, unspecified: Secondary | ICD-10-CM | POA: Insufficient documentation

## 2021-04-18 DIAGNOSIS — Z953 Presence of xenogenic heart valve: Secondary | ICD-10-CM

## 2021-04-18 DIAGNOSIS — N390 Urinary tract infection, site not specified: Secondary | ICD-10-CM | POA: Insufficient documentation

## 2021-04-18 DIAGNOSIS — G40109 Localization-related (focal) (partial) symptomatic epilepsy and epileptic syndromes with simple partial seizures, not intractable, without status epilepticus: Secondary | ICD-10-CM | POA: Insufficient documentation

## 2021-04-18 NOTE — Assessment & Plan Note (Signed)
A/P: Na has been in the range 127-132         Most recent Na on 9/2 was 135         Will continue to monitor

## 2021-04-18 NOTE — Progress Notes (Signed)
[facility]  [address]  [place] [address]  Phone: [phone]  Fax: [phone]       Date: 04/18/2021    Patient Name: [name], [name]    Patient was seen in place of residence in lieu of an office visit for the following reason:   Requires use of an assistive device in order to ambulate due to weakness from multiple brain bleeds.      LEGAL DOCUMENTS and CODE STATUS:   Advance Directive Received: Yes   Type of Document Received: Healthcare Advanced Directive      Code Status: DNAR    Chief Complaint   Patient presents with    Establish Care     Chronic medical issues      HPI:   History and ROS obtained from patient and her daughter    Zamariah Seaborn is a 87 y.o. year old female  I am here to enroll into the medical house calls program.     Problem   S/P Tavr (Transcatheter Aortic Valve Replacement), Bioprosthetic   Temporal Lobe Epilepsy   Hypothyroidism   Osteoporosis   Recurrent Uti   Gait Abnormality   Hyponatremia   Aphasia   Nontraumatic Acute Subdural Hemorrhage       87 F with PMH HTN, SDH, seizure disorder, SDH, seizure disorder, hypothyroidism, osteoporosis and frequent falls was seen to establish care.    Patient is a very pleasant retired psychology professor relocated from [place] around June 2022 and now living with her daughterShon Hale.   As per [name], patient was living alone in [place] since the passing of her husband of 65+ years about 1.5 years ago. Patient has another daughter living in [place] and a son locally here in [place]. Patient and daughter expressed interest in travelling to [place] to visit friends and family next month.    Patient is AOx3. History was obtained from patient as well as her daughter [name].   Currently patient stated she's doing well and denied any new complaints.    When in [place] patient has followed up with PCP Dr Corena Herter [phone]    As per [name] since August, patient has fallen about 4 times. 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 She then developed speech/language issues. However, with the speech therapy and seizure medications patient has been improving.    Patient has had an episode of arrhythmia during her hospitalization in august. She was then started on Digoxin. However at the cardiologist visit yesterday, Dr. Franchot Erichsen has discussed about discontinuing her Digoxin.  [name] seems concerned about that and stated she emailed the cardiologist for clarification.  Patient also stated she felt very tired after going for the visit yesterday.    [name] states that patient's balance has not been great. She sometimes forget to use her walker and is too quick with her moves. The daughter believes these predisposes the patient to falls.    As per [name], patient is independent of her ADLs. Patient has regular BM but states her stool is hard even after taking metamucil. Patient urinary incontinence and has to use the bathroom 3-4 times a night. Upon asking the patient she stated it does not interfere with the quality of life. Patient also has a h/o recurrent UTI treated with antibiotics.    Patient's memory is stable, however as per [name] sometimes she can get irritable and down. Patient has no issues sleeping.    Social HX: never smoked  1-2 glasses of wine a day (stopped about a month ago)    Family HX: mother Aneurysm                     Father colon cancer died in his 34s    Vaccination HX: vaccinated with 2 covid shots and 3 boosters (image in the chart)                                           Past Medical History:   Diagnosis Date    Bleeding in head following injury with loss of consciousness     a year ago    Convulsions     Hyperlipidemia     Hypertension     Hypothyroidism     Irregular heartbeat      Past Surgical History:   Procedure Laterality Date    AORTIC VALVE REPLACEMENT      first AVR required sternotomy, >10 years ago per patient    BACK SURGERY      HYSTERECTOMY       REPLACEMENT TOTAL KNEE Bilateral     left total, right "half knee" replacement    TRANSCATHETER AORTIC VALVE REPLACEMENT  2016    5-6 years ago per patient    WRIST SURGERY Left 2017     Family History   Problem Relation Age of Onset    Stroke Mother     Anuerysm Mother     Cancer Father     Heart disease Sister     Heart disease Brother     Intracerebral hemorrhage Neg Hx      Social History     Socioeconomic History    Marital status: Widowed   Tobacco Use    Smoking status: Never    Smokeless tobacco: Never   Vaping Use    Vaping Use: Never used   Substance and Sexual Activity    Alcohol use: Not Currently     Alcohol/week: 3.0 standard drinks     Types: 3 Glasses of wine per week     Comment: 1-2 a week    Drug use: Never     Social Determinants of Health     Financial Resource Strain: Low Risk     Difficulty of Paying Living Expenses: Not hard at all   Food Insecurity: No Food Insecurity    Worried About Programme researcher, broadcasting/film/video in the Last Year: Never true    Barista in the Last Year: Never true   Transportation Needs: No Transportation Needs    Lack of Transportation (Medical): No    Lack of Transportation (Non-Medical): No   Physical Activity: Unknown    Days of Exercise per Week: 0 days   Stress: No Stress Concern Present    Feeling of Stress : Not at all   Social Connections: Moderately Integrated    Frequency of Communication with Friends and Family: More than three times a week    Frequency of Social Gatherings with Friends and Family: More than three times a week    Attends Religious Services: More than 4 times per year    Active Member of [employer] or Organizations: Yes    Attends Banker Meetings: More than 4 times per year    Marital Status: Widowed   Intimate Partner Violence: Not At Risk    Fear  of Current or Ex-Partner: No    Emotionally Abused: No    Physically Abused: No    Sexually Abused: No   Housing Stability: Low Risk     Unable to Pay for Housing in the Last Year: No    Number  of Places Lived in the Last Year: 1    Unstable Housing in the Last Year: No     Allergies   Allergen Reactions    Codeine Nausea And Vomiting       MEDICATIONS:     Current Outpatient Medications:     amLODIPine (NORVASC) 5 MG tablet, 5 mg Takes 0.5 tab of 10mg  tab. Total of 5mg  taken per night., Disp: , Rfl:     brivaracetam (BRIVIACT) 100 MG Tab tablet, Take 1 tablet (100 mg total) by mouth every 12 (twelve) hours, Disp: 180 tablet, Rfl: 2    Fungoid Tincture 2 % Solution, APPLY TWICE A DAY TO TOE NAIL, Disp: , Rfl:     levothyroxine (SYNTHROID) 88 MCG tablet, Take 88 mcg by mouth every morning, Disp: , Rfl:     lisinopril (ZESTRIL) 10 MG tablet, TAKE 1 TABLET ORALLY ONCE A DAY EVERY MORNING FOR 30 DAY(S), Disp: , Rfl:     raloxifene (EVISTA) 60 MG tablet, Take 60 mg by mouth daily, Disp: , Rfl:     rosuvastatin (CRESTOR) 20 MG tablet, Take 20 mg by mouth nightly, Disp: , Rfl:     urea (CARMOL) 40 % cream, APPLY 1 GRAM ONCE A DAY TO NAILS AND CALLUSES, Disp: , Rfl:     ROS:   Review of Systems   Constitutional:  Positive for fatigue. Negative for activity change, appetite change, chills, fever and unexpected weight change.   Eyes:  Negative for visual disturbance.   Respiratory:  Negative for cough and shortness of breath.    Cardiovascular:  Negative for chest pain and leg swelling.   Gastrointestinal:  Positive for constipation. Negative for abdominal distention and abdominal pain.   Endocrine: Negative for polyuria (at night time).   Genitourinary:  Negative for difficulty urinating, dysuria, flank pain and hematuria.   Musculoskeletal:  Positive for gait problem. Negative for back pain.   Skin:  Negative for color change and wound.   Neurological:  Positive for seizures and speech difficulty (improved). Negative for dizziness, tremors, weakness, light-headedness, numbness and headaches.   Psychiatric/Behavioral:  Negative for agitation, behavioral problems and confusion.      All other systems pertinent  to the chief complaint reviewed and otherwise negative     PHYSICAL EXAM:   BP 110/55 Comment: sitting. standing 120/60  Pulse 74    Wt Readings from Last 1 Encounters:   04/17/21 60.4 kg (133 lb 3.2 oz)      Ht Readings from Last 1 Encounters:   04/17/21 1.6 m (5\' 3" )     Physical Exam  Constitutional:       General: She is not in acute distress.     Appearance: Normal appearance. She is not ill-appearing.   HENT:      Mouth/Throat:      Mouth: Mucous membranes are moist.   Eyes:      Conjunctiva/sclera: Conjunctivae normal.   Cardiovascular:      Rate and Rhythm: Normal rate and regular rhythm.      Heart sounds: Murmur (SEM) heard.   Pulmonary:      Effort: Pulmonary effort is normal. No respiratory distress.      Breath sounds:  Normal breath sounds.   Abdominal:      General: There is no distension.      Palpations: Abdomen is soft.      Tenderness: There is no abdominal tenderness. There is no right CVA tenderness or left CVA tenderness.   Musculoskeletal:      Right lower leg: No edema.      Left lower leg: No edema.   Skin:     General: Skin is warm and dry.      Coloration: Skin is not jaundiced.      Comments: Seborrheic keratoses over the upper back    Neurological:      General: No focal deficit present.      Mental Status: She is alert and oriented to person, place, and time. Mental status is at baseline.      Gait: Gait abnormal.   Psychiatric:         Mood and Affect: Mood normal.         Behavior: Behavior normal.         Judgment: Judgment normal.     DIAGNOSTICS:     Lab Results   Component Value Date    WBC 7.50 03/11/2021    HGB 11.2 (L) 03/11/2021    HCT 32.3 (L) 03/11/2021    PLT 186 03/11/2021     Lab Results   Component Value Date    CHOL 121 03/10/2021    TRIG 96 03/10/2021    HDL 35 (L) 03/10/2021    LDL 67 03/10/2021     Lab Results   Component Value Date    ALT 21 03/09/2021    AST 29 03/09/2021    NA 135 (L) 03/21/2021    K 4.0 03/21/2021    CL 101 03/21/2021    CREAT 0.8 03/21/2021     BUN 15.0 03/21/2021    CO2 29 03/21/2021    ALKPHOS 40 03/09/2021     Lab Results   Component Value Date    GLU 110 (H) 03/21/2021    GLUCOSEWB 104 (H) 03/09/2021    HGBA1C 5.6 03/10/2021     Lab Results   Component Value Date    TSH 1.08 03/11/2021    INR 1.0 03/10/2021       CT Head WO Contrast    Result Date: 03/21/2021  1.  Interval resolution of parenchymal hemorrhage associated with a prior left MCA/temporal lobe infarct since prior noncontrast head CT. 2.  Improved subdural hematoma along the left tentorial leaflet and posterior cerebral falx with only trace residual. 3.  No new hemorrhage or evidence of a new large vascular territory infarct. 4.  Otherwise similar chronic findings, as above, as on prior imaging. [name] MD, MD  03/21/2021 4:00 PM     ASSESSMENT and PLAN:   Nontraumatic acute subdural hemorrhage  A/P: since august 4 falls and twice hospitalized for brain bleeds as per daughter          Language issues improved after therapy and medications          No focal deficits          BP under control          Fall precautions    Aphasia  A/P: improved as per daughter         Speech almost at baseline    Hyponatremia  A/P: Na has been in the range 127-132         Most recent Na  on 9/2 was 135         Will continue to monitor    S/p TAVR (transcatheter aortic valve replacement), bioprosthetic  A/P: As per daughter valve replacement in 2009 and 2017          Bio prosthetic valve/not on [facility]         Denies any symptoms/SOB         Follows up with cardiologist Dr. Franchot Erichsen         Continue current medications                     Temporal lobe epilepsy  A/P: left temporal lobe epilepsy          Follows up with neurologist Dr. Becky Sax and [facility]         CT head 03/2021: Interval resolution of parenchymal hemorrhage associated with a  prior left MCA/temporal lobe infarct since prior noncontrast head CT.                                      Improved subdural hematoma along the left tentorial  leaflet and  posterior cerebral falx with only trace residual.          Continue Briviat          Seizure precautions       Hypothyroidism  A/P: TSH 1.08 about a month ago          Continue Levothyroxine          Reports no symptoms    Osteoporosis  A/P: Unknown last DEXA or results          Will contact previous PCP Dr. Corena Herter [phone] to get more information          Will order DEXA scan          Continue Raloxifene for now. Reports no side effects    Recurrent UTI  A/P: Likely secondary to urinary incontinence and vaginal dryness?          Recently treated with antibiotics          Currently denies any symptoms              Gait abnormality  A/P: Multiple falls in the past          H/O SDH         Orthostatics negative         Discussed securing the carpets and minimizing clutter around the house.         Continue to use the walker         Bathroom with grab bars         PT ordered for balance and strengthening          Fall precautions    Orders Placed This Encounter   Procedures    Dxa Bone Density Axial Skeleton     Standing Status:   Future     Standing Expiration Date:   04/18/2022     Order Specific Question:   Reason for Exam:     Answer:   h/o osteoporosis     Order Specific Question:   Release to patient     Answer:   Immediate    Ambulatory referral to Physical Therapy     Referral Priority:   Routine     Referral Type:   Consultation  Referral Reason:   Specialty Services Required     Requested Specialty:   Physical Therapy     Number of Visits Requested:   1       .    Electronically Signed by Buford Dresser, MD

## 2021-04-18 NOTE — Assessment & Plan Note (Signed)
A/P: Multiple falls in the past          H/O SDH         Orthostatics negative         Discussed securing the carpets and minimizing clutter around the house.         Continue to use the walker         Bathroom with grab bars         PT ordered for balance and strengthening          Fall precautions

## 2021-04-18 NOTE — Assessment & Plan Note (Signed)
A/P: Likely secondary to urinary incontinence and vaginal dryness?          Recently treated with antibiotics          Currently denies any symptoms

## 2021-04-18 NOTE — Assessment & Plan Note (Signed)
A/P: As per daughter valve replacement in 2009 and 2017          Bio prosthetic valve/not on [facility]         Denies any symptoms/SOB         Follows up with cardiologist Dr. Franchot Erichsen         Continue current medications

## 2021-04-18 NOTE — Assessment & Plan Note (Signed)
A/P: since august 4 falls and twice hospitalized for brain bleeds as per daughter          Language issues improved after therapy and medications          No focal deficits          BP under control          Fall precautions

## 2021-04-18 NOTE — Assessment & Plan Note (Addendum)
A/P: TSH 1.08 about a month ago          Continue Levothyroxine          Reports no symptoms

## 2021-04-18 NOTE — Assessment & Plan Note (Addendum)
A/P: Unknown last DEXA or results          Will contact previous PCP Dr. Corena Herter [phone] to get more information          Will order DEXA scan          Continue Raloxifene for now. Reports no side effects

## 2021-04-18 NOTE — Assessment & Plan Note (Signed)
A/P: left temporal lobe epilepsy          Follows up with neurologist Dr. Becky Sax and [facility]         CT head 03/2021: Interval resolution of parenchymal hemorrhage associated with a  prior left MCA/temporal lobe infarct since prior noncontrast head CT.                                      Improved subdural hematoma along the left tentorial leaflet and  posterior cerebral falx with only trace residual.          Continue Briviat          Seizure precautions

## 2021-04-18 NOTE — Assessment & Plan Note (Signed)
A/P: improved as per daughter         Speech almost at baseline

## 2021-04-19 ENCOUNTER — Encounter (INDEPENDENT_AMBULATORY_CARE_PROVIDER_SITE_OTHER): Payer: Self-pay | Admitting: Cardiology

## 2021-04-21 ENCOUNTER — Other Ambulatory Visit (INDEPENDENT_AMBULATORY_CARE_PROVIDER_SITE_OTHER): Payer: Self-pay | Admitting: Cardiology

## 2021-04-21 ENCOUNTER — Telehealth (INDEPENDENT_AMBULATORY_CARE_PROVIDER_SITE_OTHER): Payer: Self-pay | Admitting: Student in an Organized Health Care Education/Training Program

## 2021-04-21 ENCOUNTER — Encounter (INDEPENDENT_AMBULATORY_CARE_PROVIDER_SITE_OTHER): Payer: Self-pay | Admitting: Cardiology

## 2021-04-21 DIAGNOSIS — R002 Palpitations: Secondary | ICD-10-CM

## 2021-04-21 NOTE — Telephone Encounter (Signed)
PT referral order, face sheet and PCP visit note faxed to [facility] informing each fax on cover sheet  pt [place] address (not [place]).  Spoke with dtr [name], informing her of PT referral sent to [facility] and she adds that she made dexa appt at [facility] -  [facility],  on Oct 13.

## 2021-04-21 NOTE — Telephone Encounter (Signed)
-----   Message from Buford Dresser, MD sent at 04/18/2021  8:48 PM EDT -----  I have ordered home PT for balance and strengthening.  Also  DEXA scan  Could you please do the needful.  thanks

## 2021-05-01 ENCOUNTER — Ambulatory Visit
Admission: RE | Admit: 2021-05-01 | Discharge: 2021-05-01 | Disposition: A | Discharge status: Home / Self Care | Payer: Medicare Other | Source: Ambulatory Visit | Attending: Student in an Organized Health Care Education/Training Program | Admitting: Student in an Organized Health Care Education/Training Program

## 2021-05-01 DIAGNOSIS — M810 Age-related osteoporosis without current pathological fracture: Secondary | ICD-10-CM | POA: Insufficient documentation

## 2021-05-04 ENCOUNTER — Encounter (INDEPENDENT_AMBULATORY_CARE_PROVIDER_SITE_OTHER): Payer: Self-pay | Admitting: Student in an Organized Health Care Education/Training Program

## 2021-05-06 ENCOUNTER — Ambulatory Visit
Admission: RE | Admit: 2021-05-06 | Discharge: 2021-05-06 | Disposition: A | Discharge status: Home / Self Care | Payer: Medicare Other | Source: Ambulatory Visit | Attending: Nurse Practitioner | Admitting: Nurse Practitioner

## 2021-05-06 DIAGNOSIS — S065XAA Traumatic subdural hemorrhage with loss of consciousness status unknown, initial encounter: Secondary | ICD-10-CM | POA: Insufficient documentation

## 2021-05-07 ENCOUNTER — Ambulatory Visit: Payer: Medicare Other | Admitting: Nurse Practitioner

## 2021-05-07 ENCOUNTER — Encounter (INDEPENDENT_AMBULATORY_CARE_PROVIDER_SITE_OTHER): Payer: Self-pay | Admitting: Nurse Practitioner

## 2021-05-07 VITALS — BP 122/74 | HR 81 | Temp 98.4°F | Resp 18 | Wt 135.0 lb

## 2021-05-07 DIAGNOSIS — K5900 Constipation, unspecified: Secondary | ICD-10-CM | POA: Insufficient documentation

## 2021-05-07 DIAGNOSIS — E785 Hyperlipidemia, unspecified: Secondary | ICD-10-CM | POA: Insufficient documentation

## 2021-05-07 DIAGNOSIS — M79675 Pain in left toe(s): Secondary | ICD-10-CM | POA: Insufficient documentation

## 2021-05-07 DIAGNOSIS — R269 Unspecified abnormalities of gait and mobility: Secondary | ICD-10-CM

## 2021-05-07 DIAGNOSIS — E039 Hypothyroidism, unspecified: Secondary | ICD-10-CM

## 2021-05-07 DIAGNOSIS — M810 Age-related osteoporosis without current pathological fracture: Secondary | ICD-10-CM

## 2021-05-07 DIAGNOSIS — N390 Urinary tract infection, site not specified: Secondary | ICD-10-CM

## 2021-05-07 DIAGNOSIS — G40109 Localization-related (focal) (partial) symptomatic epilepsy and epileptic syndromes with simple partial seizures, not intractable, without status epilepticus: Secondary | ICD-10-CM

## 2021-05-07 DIAGNOSIS — I10 Essential (primary) hypertension: Secondary | ICD-10-CM | POA: Insufficient documentation

## 2021-05-07 DIAGNOSIS — I6201 Nontraumatic acute subdural hemorrhage: Secondary | ICD-10-CM

## 2021-05-07 MED ORDER — LISINOPRIL 10 MG PO TABS
ORAL_TABLET | ORAL | 3 refills | Status: DC
Start: 2021-05-07 — End: 2021-09-08

## 2021-05-07 MED ORDER — RALOXIFENE HCL 60 MG PO TABS
60.0000 mg | ORAL_TABLET | Freq: Every day | ORAL | 3 refills | Status: DC
Start: 2021-05-07 — End: 2021-05-14

## 2021-05-07 MED ORDER — ROSUVASTATIN CALCIUM 20 MG PO TABS
20.0000 mg | ORAL_TABLET | Freq: Every evening | ORAL | 3 refills | Status: DC
Start: 2021-05-07 — End: 2022-07-02

## 2021-05-07 MED ORDER — LEVOTHYROXINE SODIUM 88 MCG PO TABS
88.0000 ug | ORAL_TABLET | Freq: Every morning | ORAL | 3 refills | Status: DC
Start: 2021-05-07 — End: 2022-04-20

## 2021-05-07 NOTE — Assessment & Plan Note (Signed)
A/P: Continue to use walker.  Fall precautions.

## 2021-05-07 NOTE — Assessment & Plan Note (Signed)
A/P: Continue fall precautions.  Continue to use walker.

## 2021-05-07 NOTE — Assessment & Plan Note (Signed)
A/P: Continue raloxifene; refill sent.

## 2021-05-07 NOTE — Assessment & Plan Note (Signed)
A/P:  Continue lisinopril 10 mg oral daily; refill sent.

## 2021-05-07 NOTE — Assessment & Plan Note (Signed)
A/P: They are aware of s/s of UTI and will report if they occur.  Encourage po fluids.

## 2021-05-07 NOTE — Assessment & Plan Note (Signed)
A/P: Continue levothyroxine 88 mcg oral daily, refill sent.

## 2021-05-07 NOTE — Assessment & Plan Note (Signed)
A/P: Drinking warm prune juice daily.  Prefers this to metamucil and senna tea was too strong.

## 2021-05-07 NOTE — Progress Notes (Signed)
[facility]  [address]  [place] [address]  Phone: [phone]  Fax: [phone]       Date: 05/07/2021    Patient Name: [name], [name]    Patient was seen in place of residence in lieu of an office visit for the following reason:   Requires use of an assistive device in order to ambulate due to weakness and multiple falls.      LEGAL DOCUMENTS and CODE STATUS:   Advance Directive Received: Yes   Type of Document Received: Healthcare Advanced Directive      Code Status: DNAR    Chief Complaint   Patient presents with    Seizures    Osteoporosis    Urinary Tract Infection Symptoms    Gait Problem        HPI:   History and ROS obtained from patient and daughter, [name].    [name] is a 87 y.o. year old female, established patient that I am here to see to for mutliple chronic comorbidities.  Overall, she has been doing better.  No falls and seizures stable on Briviact.  Was having some twitching near right eye during visit. Occasional facial twitching noted when she is almost due for next dose but doesn't feel it's enough to warrant increase in dose.   Was having some twitching near right eye during visit.      Balance is better but forgets to use walker.  No falls.  They walk in the neighborhood and she pushes WC so if she fatigues, she can sit and daughter will push.  Has noticed some lower extremity swelling which she attributes to legs dangling while in WC.    Planning on going to [facility] 05/12/21 for a visit.  Return TBD.  Will see PCP and podiatrist while there for f/u.  Podiatrist follows left toe pain which she has had for a while.  Upon return, will have an external monitor patch for a week per cardio, Dr. Collier Bullock, to f/u arrhythmias.      Problem   Toe Pain, Left    Has had left foot toe pain (#3 & 4) for which she sees a podiatrist in [place], [place].  Has injected with cortisone which provided relief.  Going to [facility] 10/24  for visit and will see podiatrist while there.  Using diclofenac on toes at bedtime which helps.  May be contributing to gait abnormality.       Constipation   Hypertension   Hyperlipidemia   Temporal Lobe Epilepsy    Daughter reported presence of periodic right facial twitching for five years prior to most recent onset of seizures.  Since starting Briviact, has occurred less frequently.  However, will start again about an hour before next dose of Briviact is due. Sees Dr. Becky Sax, neurologis, tomorrow for follow up.  Was having some twitching near right eye during visit.      Hypothyroidism   Osteoporosis    Dexa bone density axial skeleton 05/01/21 indicates osteopenia left femoral neck and total left hip.  Normal bone mineral density of lumbar spine.     Recurrent Uti    Chronic.  "Lives with it."       Gait Abnormality   Nontraumatic Acute Subdural Hemorrhage    No recent falls.  CT head w/o contrast 05/06/21 - no acute intracerebral abnormality.  Resolved subdural hematoma.         Seizures   Pertinent negatives  include no cough, no nausea and no diarrhea.   Urinary Tract Infection Symptoms   Pertinent negatives include no chills, frequency or nausea.     Past Medical History:   Diagnosis Date    Bleeding in head following injury with loss of consciousness     a year ago    Convulsions     Hyperlipidemia     Hypertension     Hypothyroidism     Irregular heartbeat      Past Surgical History:   Procedure Laterality Date    AORTIC VALVE REPLACEMENT      first AVR required sternotomy, >10 years ago per patient    BACK SURGERY      HYSTERECTOMY      REPLACEMENT TOTAL KNEE Bilateral     left total, right "half knee" replacement    TRANSCATHETER AORTIC VALVE REPLACEMENT  2016    5-6 years ago per patient    WRIST SURGERY Left 2017     Family History   Problem Relation Age of Onset    Stroke Mother     Anuerysm Mother     Cancer Father     Heart disease Sister     Heart disease Brother     Intracerebral hemorrhage  Neg Hx      Social History     Socioeconomic History    Marital status: Widowed   Tobacco Use    Smoking status: Never    Smokeless tobacco: Never   Vaping Use    Vaping Use: Never used   Substance and Sexual Activity    Alcohol use: Not Currently     Alcohol/week: 3.0 standard drinks     Types: 3 Glasses of wine per week     Comment: 1-2 a week    Drug use: Never     Social Determinants of Health     Financial Resource Strain: Low Risk     Difficulty of Paying Living Expenses: Not hard at all   Food Insecurity: No Food Insecurity    Worried About Programme researcher, broadcasting/film/video in the Last Year: Never true    Barista in the Last Year: Never true   Transportation Needs: No Transportation Needs    Lack of Transportation (Medical): No    Lack of Transportation (Non-Medical): No   Physical Activity: Unknown    Days of Exercise per Week: 0 days   Stress: No Stress Concern Present    Feeling of Stress : Not at all   Social Connections: Moderately Integrated    Frequency of Communication with Friends and Family: More than three times a week    Frequency of Social Gatherings with Friends and Family: More than three times a week    Attends Religious Services: More than 4 times per year    Active Member of [employer] or Organizations: Yes    Attends Banker Meetings: More than 4 times per year    Marital Status: Widowed   Catering manager Violence: Not At Risk    Fear of Current or Ex-Partner: No    Emotionally Abused: No    Physically Abused: No    Sexually Abused: No   Housing Stability: Low Risk     Unable to Pay for Housing in the Last Year: No    Number of Places Lived in the Last Year: 1    Unstable Housing in the Last Year: No     Allergies   Allergen Reactions  Codeine Nausea And Vomiting       MEDICATIONS:     Current Outpatient Medications:     amLODIPine (NORVASC) 5 MG tablet, Take 5 mg by mouth nightly, Disp: , Rfl:     brivaracetam (BRIVIACT) 100 MG Tab tablet, Take 1 tablet (100 mg total) by mouth every  12 (twelve) hours, Disp: 180 tablet, Rfl: 2    Fungoid Tincture 2 % Solution, APPLY TWICE A DAY TO TOE NAIL, Disp: , Rfl:     levothyroxine (SYNTHROID) 88 MCG tablet, Take 1 tablet (88 mcg total) by mouth every morning, Disp: 90 tablet, Rfl: 3    lisinopril (ZESTRIL) 10 MG tablet, TAKE 1 TABLET ORALLY ONCE A DAY EVERY MORNING FOR 30 DAY(S), Disp: 30 tablet, Rfl: 3    raloxifene (EVISTA) 60 MG tablet, Take 1 tablet (60 mg total) by mouth daily, Disp: 90 tablet, Rfl: 3    rosuvastatin (CRESTOR) 20 MG tablet, Take 1 tablet (20 mg total) by mouth nightly, Disp: 90 tablet, Rfl: 3    urea (CARMOL) 40 % cream, APPLY 1 GRAM ONCE A DAY TO NAILS AND CALLUSES, Disp: , Rfl:     ROS:   Review of Systems   Constitutional:  Negative for appetite change, chills, fatigue and fever.   Respiratory:  Negative for cough and shortness of breath.    Cardiovascular: Negative.    Gastrointestinal:  Negative for constipation (good results with warm prune juice), diarrhea and nausea.   Genitourinary:  Negative for difficulty urinating, dysuria and frequency.   Musculoskeletal:  Positive for gait problem.        Some left foot toe #3 & 4 pain, ? Cause; not new   Neurological:  Positive for seizures (stable on Briviact; some twitching near right eye) and weakness (improving). Negative for dizziness.     All other systems pertinent to the chief complaint reviewed and otherwise negative     PHYSICAL EXAM:   BP 122/74    Pulse 81    Temp 98.4 F (36.9 C)    Resp 18    Wt 61.2 kg (135 lb) Comment: recent weight a MD appt   SpO2 99%    BMI 23.91 kg/m    Wt Readings from Last 1 Encounters:   05/07/21 61.2 kg (135 lb)      Ht Readings from Last 1 Encounters:   04/17/21 1.6 m (5\' 3" )     Physical Exam  Constitutional:       Appearance: She is not ill-appearing or toxic-appearing.      Comments: Appeared fatigued   Cardiovascular:      Rate and Rhythm: Normal rate and regular rhythm.      Heart sounds: Murmur (3/6 pan-SEM LSB) heard.   Pulmonary:       Effort: Pulmonary effort is normal. No respiratory distress.      Breath sounds: Normal breath sounds.   Abdominal:      General: Bowel sounds are normal. There is no distension.      Tenderness: There is no abdominal tenderness.   Musculoskeletal:      Right lower leg: Edema (1+ pedal/ankle edema) present.      Left lower leg: Edema (1+ pedal/ankle edema) present.   Skin:     General: Skin is warm and dry.      Findings: No rash.   Neurological:      General: No focal deficit present.      Mental Status: She is alert and oriented  to person, place, and time.   Psychiatric:         Mood and Affect: Mood normal.     DIAGNOSTICS:     Lab Results   Component Value Date    WBC 7.50 03/11/2021    HGB 11.2 (L) 03/11/2021    HCT 32.3 (L) 03/11/2021    PLT 186 03/11/2021     Lab Results   Component Value Date    CHOL 121 03/10/2021    TRIG 96 03/10/2021    HDL 35 (L) 03/10/2021    LDL 67 03/10/2021     Lab Results   Component Value Date    ALT 21 03/09/2021    AST 29 03/09/2021    NA 135 (L) 03/21/2021    K 4.0 03/21/2021    CL 101 03/21/2021    CREAT 0.8 03/21/2021    BUN 15.0 03/21/2021    CO2 29 03/21/2021    ALKPHOS 40 03/09/2021     Lab Results   Component Value Date    GLU 110 (H) 03/21/2021    GLUCOSEWB 104 (H) 03/09/2021    HGBA1C 5.6 03/10/2021     Lab Results   Component Value Date    TSH 1.08 03/11/2021    INR 1.0 03/10/2021       CT Head WO Contrast    Result Date: 05/07/2021   No acute intracerebral abnormality. Resolved subdural hematoma. Note: Note that CT scanning at this site  utilizes multiple dose reduction techniques including automatic exposure control, adjustment of the MAA and/or KVP according to patient's size and use of iterative reconstruction technique Laurena Slimmer, MD  05/07/2021 7:01 AM    Dxa Bone Density Axial Skeleton    Result Date: 05/01/2021   Osteopenia of the left femoral neck and total left hip. Normal bone mineral density of the lumbar spine. Osteopenia:    T-score = greater than  -1.0 to -2.5 Osteoporosis: T-score = -2.5 or lower Fracture risk: Doubles compared to young adult for each T-score greater than -1.0 e.g.  T-score -1.0 = 2 x fracture risk         T-score -2.0 = 4 x fracture risk If the Z-score is less than -1.00, an etiology other than or in addition to age-related bone loss must be considered. Kinnie Feil, MD  05/01/2021 12:59 PM     ASSESSMENT and PLAN:   Temporal lobe epilepsy  A/P: Seizures activity less on Briviact.  Follow-up with Dr. Becky Sax tomorrow.  Seizure precautions.    Nontraumatic acute subdural hemorrhage  A/P: Continue fall precautions.  Continue to use walker.    Gait abnormality  A/P: Continue to use walker.  Fall precautions.    Osteoporosis  A/P: Continue raloxifene; refill sent.     Recurrent UTI  A/P: They are aware of s/s of UTI and will report if they occur.  Encourage po fluids.    Toe pain, left  A/P: Continue to use prn diclofenac at bedtime.  Will see if there is a podiatrist that does house calls and who can visit when she returns.  Keeps legs elevated.    Constipation  A/P: Drinking warm prune juice daily.  Prefers this to metamucil and senna tea was too strong.    Hypothyroidism  A/P: Continue levothyroxine 88 mcg oral daily, refill sent.    Hypertension  A/P:  Continue lisinopril 10 mg oral daily; refill sent.    Hyperlipidemia  A/P: Continue rosuvastatin 20 mg oral nightly; refill sent.  Medications Ordered This Encounter         Disp Refills Start End    raloxifene (EVISTA) 60 MG tablet 90 tablet 3 05/07/2021     Take 1 tablet (60 mg total) by mouth daily - Oral    lisinopril (ZESTRIL) 10 MG tablet 30 tablet 3 05/07/2021     TAKE 1 TABLET ORALLY ONCE A DAY EVERY MORNING FOR 30 DAY(S)    rosuvastatin (CRESTOR) 20 MG tablet 90 tablet 3 05/07/2021     Take 1 tablet (20 mg total) by mouth nightly - Oral    levothyroxine (SYNTHROID) 88 MCG tablet 90 tablet 3 05/07/2021     Take 1 tablet (88 mcg total) by mouth every morning - Oral          No  orders of the defined types were placed in this encounter.        Electronically Signed by Zetta Bills, NP

## 2021-05-07 NOTE — Assessment & Plan Note (Signed)
A/P: Seizures activity less on Briviact.  Follow-up with Dr. Becky Sax tomorrow.  Seizure precautions.

## 2021-05-07 NOTE — Assessment & Plan Note (Addendum)
A/P: Continue to use prn diclofenac at bedtime.  Will see if there is a podiatrist that does house calls and who can visit when she returns.  Keeps legs elevated.

## 2021-05-07 NOTE — Assessment & Plan Note (Signed)
A/P: Continue rosuvastatin 20 mg oral nightly; refill sent.

## 2021-05-08 ENCOUNTER — Encounter (INDEPENDENT_AMBULATORY_CARE_PROVIDER_SITE_OTHER): Payer: Self-pay | Admitting: Nurse Practitioner

## 2021-05-08 ENCOUNTER — Ambulatory Visit (INDEPENDENT_AMBULATORY_CARE_PROVIDER_SITE_OTHER): Payer: Medicare Other | Admitting: Nurse Practitioner

## 2021-05-08 VITALS — BP 116/60 | HR 77 | Ht 63.0 in | Wt 135.0 lb

## 2021-05-08 DIAGNOSIS — S065XAA Traumatic subdural hemorrhage with loss of consciousness status unknown, initial encounter: Secondary | ICD-10-CM

## 2021-05-08 NOTE — Progress Notes (Signed)
[name]  Follow up Note    Previous Impression/Plan       HPI     Chief Complaint   Patient presents with    SDH (subdural hematoma)     F/u with CT    44 yoF who fell on 03/03/2021 and sustained SDH. She presented today for follow up. She is doing well. She denies any new symptoms    Physical Examination   VITAL SIGNS:   height is 1.6 m (5\' 3" ) and weight is 61.2 kg (135 lb). Her blood pressure is 116/60 and her pulse is 77.        Neurologic Exam  Awake, alert, oriented x3, Follows commands  GCS: 15 E4 V5 M6  aware of current event   CN II: PERRL  CN III, IV, VI: EOMI, no nystagmus  CN V: Normal sensation in all 3 trigeminal divisions bilaterally  CN VII: Face symmetric  CN VIII: Hearing intact bilaterally  CN XI: Shoulder shrug symmetric  CN XII: Tongue midline  Speech is clear  Attention span and concentration: intact  Recent and remote memory: intact  Pronator drift: Negative  Finger to nose: Normal  Moves BUEs and BLEs 5/5 strength  Sensation intact bilaterlly  Biceps (C5-6): right 2+, left 2+  Brachioradialis C5-6): right  2+ , left 2+,  Patellar (L3-4): right 2+, left 2+,  Clonus: right negative, left negative   Hoffman's: right negative, left negative  Tandem gait: Normal  Romberg: Normal    Review of Systems   Review of Systems  Constitutional: negative for fever and chills  HENT: Negative for neck pain, sore throat  Eyes: negative for eye pain  Respiratory: Negative for shortness of breath  Cardiovascular: Negative for chest pain, chest tightness   Gastrointestinal: Negative for abdominal pain, nausea and vomiting  Genitourinary: Negative for dysuria, frequency   Musculoskeletal: Negative for gait problem, imbalance  Skin: Negative for rash, blisters,  Neurological: Negative for weakness, numbness and tingling  Hematological: Negative for bruising easily  Psychiatric/Behavioral: Negative for confusion,     Radiology Interpretation   CT Head WO Contrast 05/06/2021 at [place]:      INDICATION:SDH     TECHNIQUE: Axial images were obtained from the skull base to vertex  without contrast. Comparison to 03/21/21     FINDINGS: The ventricles and sulci are moderately prominent but  consistent with patient's chronologic age. Decreased density is present  in the deep white matter of each hemisphere consistent with small vessel  disease change. No intracerebral mass or hemorrhage is present, and no  extra-axial collections are present. Previously seen left subdural  hematoma is no longer noted. Previously seen moderate size region of  hypoattenuation in the inferior left temporal lobe has markedly improved  and there is only a small focal region of encephalomalacia seen in this  region.     IMPRESSION:    No acute intracerebral abnormality. Resolved subdural  hematoma.     Note: Note that CT scanning at this site  utilizes multiple dose  reduction techniques including automatic exposure control, adjustment of  the MAA and/or KVP according to patient's size and use of iterative  reconstruction technique     Impression   89 yoF who fell on 03/03/2021 and sustained SDH. She is doing well, her head CT shows resolution of SDH      Plan     No orders of the defined types were placed in this encounter.    Follow-up  As needed  [name], DNP NP

## 2021-05-09 ENCOUNTER — Encounter (INDEPENDENT_AMBULATORY_CARE_PROVIDER_SITE_OTHER): Payer: Medicare Other | Admitting: Nurse Practitioner

## 2021-05-12 ENCOUNTER — Telehealth (INDEPENDENT_AMBULATORY_CARE_PROVIDER_SITE_OTHER): Payer: Self-pay | Admitting: Nurse Practitioner

## 2021-05-12 NOTE — Telephone Encounter (Signed)
Patient fell this morning in her room. Patient said that she lost her balance and fell backward. Fall was signifcant and she made a large hole in the wall. They do not know if she hit her head. Patient is complaining of right shoulder pain. Patient is able to move the shoulder. No bruising or broken skin. Patient is talking to her daughter, no neuro changes. Patient is supposed to be flying out today at 5 PM to [place] to go see her family. She requests to speak with the provider.   [name] [phone]

## 2021-05-12 NOTE — Telephone Encounter (Signed)
Received call back from [name] saying pt did wake up, ate, drank, no dizziness, pt is coherent;  [name] took the BP at 9:30 pt was sitting chair was 171/83;  pt was given medications and Tylenol.  11 am BP 152/77; no signs or symptoms.  LPN let [name] know BP can elevate when pt has some pain, and as long as no symptoms of swelling, headache, dizziness, ext, to keep monitoring the pt and call if pt worsens.  [name] understood.

## 2021-05-14 ENCOUNTER — Telehealth (INDEPENDENT_AMBULATORY_CARE_PROVIDER_SITE_OTHER): Payer: Self-pay | Admitting: Nurse Practitioner

## 2021-05-14 ENCOUNTER — Encounter (INDEPENDENT_AMBULATORY_CARE_PROVIDER_SITE_OTHER): Payer: Self-pay

## 2021-05-14 NOTE — Telephone Encounter (Signed)
Daughter called back. Writer relayed PCP's message to daughter that PCP stopped raloxifene.PCP to re-check dexa in 1-2 years.    Daughter voiced understanding and stated that she will take raloxifene off the menu. Daughter expressed her appreciation to PCP.

## 2021-05-14 NOTE — Telephone Encounter (Signed)
Noted  

## 2021-05-14 NOTE — Telephone Encounter (Signed)
Left message for daughter.      Med pool, please try to call her and let her know after discussing with Dr. Algis Downs,  I have stopped raloxifene.  Will re-check dexa in 1-2 years.  Thank you.

## 2021-06-24 ENCOUNTER — Telehealth (INDEPENDENT_AMBULATORY_CARE_PROVIDER_SITE_OTHER): Payer: Self-pay | Admitting: Nurse Practitioner

## 2021-06-24 NOTE — Telephone Encounter (Signed)
Received call from daughter [name] saying pt is at sister's in [place] until Dec 21.  Pt having some agitation/ bad mood.  Pt always cranky.  Says no nice things to daughters at times.  [name] asking if something could be prescribed to help pt's mood.        2. [name] said that pt was placed on Briviact from Neurologist, which helped her twitching on side of face, however now the Pt's twitching has returned.  [name] wondering what to do about pt and this medication.  LPN let [name] know that she needs to contact the neurologist as he prescribed it.  [name] did place a call and is waiting on a call back.    PCP please advise about mood medication.  Script would need to be sent to Pharmacy at [facility], [place] in system.       Pt message also written on 05/04/21.

## 2021-06-27 ENCOUNTER — Telehealth: Payer: Self-pay | Admitting: Neurology

## 2021-06-27 NOTE — Telephone Encounter (Signed)
Patients daughter, [name] is calling in regards to medication brivaracetam (BRIVIACT) 100 MG Tab tablet. She says that it helped with the eye twitching and facial spasms in the beginning.. now it's all back and patient is having same issue.Marland Kitchen is there any other medication she can use?  Call back @ [phone].

## 2021-06-30 ENCOUNTER — Telehealth (INDEPENDENT_AMBULATORY_CARE_PROVIDER_SITE_OTHER): Payer: Self-pay | Admitting: Nurse Practitioner

## 2021-06-30 NOTE — Telephone Encounter (Signed)
Spoke with dtr, [name].  Mother still in [place] and has tested positive for Covid.  Asymptomatic.  Acetaminophen prn.  Not being treated with Paxlovid.  Advised to monitor pOx and temp and contact her PCP in [place] if she becomes symptomatic.  Stated she was given pneumonia vaccine while in [place].    Mother has been agitated since she has been in [place].  Taking it out on her dtr, [name]'s sister.  Asked if she could be started on something before she comes home 12/21.  Explained that I cannot prescribe in [place] and that her mother needs to be assessed to determine appropriate medication after she returns.  Stated she understood.  Will see her 12/23.    [name] has been trying to reach neurologist because her mother's twitching has started again.  Wants to discuss Briviact dose to see if it needs to be increased.  Told her I will also speak with him before starting anything new as some meds used for managing agitation can lower seizure threshold.

## 2021-07-11 ENCOUNTER — Ambulatory Visit: Payer: Medicare Other | Admitting: Nurse Practitioner

## 2021-07-11 ENCOUNTER — Encounter (INDEPENDENT_AMBULATORY_CARE_PROVIDER_SITE_OTHER): Payer: Medicare Other | Admitting: Nurse Practitioner

## 2021-07-11 DIAGNOSIS — K5900 Constipation, unspecified: Secondary | ICD-10-CM

## 2021-07-11 DIAGNOSIS — F4321 Adjustment disorder with depressed mood: Secondary | ICD-10-CM

## 2021-07-11 DIAGNOSIS — G40109 Localization-related (focal) (partial) symptomatic epilepsy and epileptic syndromes with simple partial seizures, not intractable, without status epilepticus: Secondary | ICD-10-CM

## 2021-07-11 DIAGNOSIS — Z8616 Personal history of COVID-19: Secondary | ICD-10-CM

## 2021-07-11 DIAGNOSIS — I6201 Nontraumatic acute subdural hemorrhage: Secondary | ICD-10-CM

## 2021-07-11 DIAGNOSIS — M79675 Pain in left toe(s): Secondary | ICD-10-CM

## 2021-07-11 NOTE — Telephone Encounter (Signed)
Daughter was called and pt is home for appt with PCP today.   She understood the PCP could not prescribe meds in [place].

## 2021-07-13 ENCOUNTER — Encounter (INDEPENDENT_AMBULATORY_CARE_PROVIDER_SITE_OTHER): Payer: Self-pay | Admitting: Nurse Practitioner

## 2021-07-13 DIAGNOSIS — F4321 Adjustment disorder with depressed mood: Secondary | ICD-10-CM | POA: Insufficient documentation

## 2021-07-13 DIAGNOSIS — Z8616 Personal history of COVID-19: Secondary | ICD-10-CM | POA: Insufficient documentation

## 2021-07-13 NOTE — Progress Notes (Signed)
[facility]  [address]  [place] [address]  Phone: [phone]  Fax: [phone]       Date: 07/11/2021    Patient Name: [name]    Patient was seen in place of residence (POS 26) in lieu of an office visit for the following reason:   Requires use of an assistive device in order to ambulate due to weakness and history of falls.      LEGAL DOCUMENTS and CODE STATUS:   Advance Directive Received: Yes   Type of Document Received: Healthcare Advanced Directive      Code Status: DNAR    Chief Complaint   Patient presents with    Follow-up    Seizures    Depression    Foot Pain      HPI:   History and ROS obtained from patient and daughter,    Porscha Axley is a 87 y.o. female  I am here to to address   Problem   Situational Depression    Patient recently returned from [place], [place] to live with her daughter and family.  Daughter reported while in [place], her mother became agitated and "took it out on my sister," who was there trying to help their mother.  Patient admitted to feeling sad about leaving [place] as well as the lose of her husband of 67 years almost two years ago.  She is sleeping well but appetite has not been as good as it was.  Denied abd pain, N/V.  Just not as hungry.  Doesn't feel she has lost weight. Denied feeling depressed but does feel the loss.     Post-Covid Syndrome Resolved    Tested positive for Covid-19 while in [place].  Was asymptomatic and took acetaminophen prn.  Not treateded with Paxlovid.   Daughter reported mother received pneumonia vaccine (which one?) while in [place].     Toe Pain, Left    Continues to have left foot toe pain (#4 & 5; previously was #3 & 4).  Saw podiatrist in [place] but did not recieve cortisone injection.   Using diclofenac on toes at bedtime which helps.  Would like a podiatrist in [place].     Constipation    Continues to have occasional constipation.      Temporal Lobe Epilepsy    Daughter  reported noticing an increase in frequency of right facial twitching (near eye).  Patient denied. She has an appointment to see Dr. Becky Sax for f/u next month re remaining on Briviact.  No falls but not using walker.       Nontraumatic Acute Subdural Hemorrhage    CT head w/o contrast 05/06/21 - no acute intracerebral abnormality.  Resolved subdural hematoma.         Seizures   Pertinent negatives include no chest pain, no cough, no nausea, no vomiting and no diarrhea.   DepressionPatient is not experiencing: nervousness/anxiety and shortness of breath.      Foot Pain  Associated symptoms include arthralgias (Left foot toe pain). Pertinent negatives include no chest pain, chills, coughing, fever, myalgias, nausea, rash, vomiting or weakness.      Past Medical History:   Diagnosis Date    Bleeding in head following injury with loss of consciousness     a year ago    Convulsions     Hyperlipidemia     Hypertension     Hypothyroidism     Irregular heartbeat  Past Surgical History:   Procedure Laterality Date    AORTIC VALVE REPLACEMENT      first AVR required sternotomy, >10 years ago per patient    BACK SURGERY      HYSTERECTOMY      REPLACEMENT TOTAL KNEE Bilateral     left total, right "half knee" replacement    TRANSCATHETER AORTIC VALVE REPLACEMENT  2016    5-6 years ago per patient    WRIST SURGERY Left 2017     Family History   Problem Relation Age of Onset    Stroke Mother     Anuerysm Mother     Cancer Father     Heart disease Sister     Heart disease Brother     Intracerebral hemorrhage Neg Hx      Social History     Socioeconomic History    Marital status: Widowed   Tobacco Use    Smoking status: Never    Smokeless tobacco: Never   Vaping Use    Vaping Use: Never used   Substance and Sexual Activity    Alcohol use: Not Currently     Alcohol/week: 3.0 standard drinks     Types: 3 Glasses of wine per week     Comment: 1-2 a week    Drug use: Never     Social Determinants of Health     Financial Resource  Strain: Low Risk     Difficulty of Paying Living Expenses: Not hard at all   Food Insecurity: No Food Insecurity    Worried About Programme researcher, broadcasting/film/video in the Last Year: Never true    Barista in the Last Year: Never true   Transportation Needs: No Transportation Needs    Lack of Transportation (Medical): No    Lack of Transportation (Non-Medical): No   Physical Activity: Unknown    Days of Exercise per Week: 0 days   Stress: No Stress Concern Present    Feeling of Stress : Not at all   Social Connections: Moderately Integrated    Frequency of Communication with Friends and Family: More than three times a week    Frequency of Social Gatherings with Friends and Family: More than three times a week    Attends Religious Services: More than 4 times per year    Active Member of [employer] or Organizations: Yes    Attends Banker Meetings: More than 4 times per year    Marital Status: Widowed   Catering manager Violence: Not At Risk    Fear of Current or Ex-Partner: No    Emotionally Abused: No    Physically Abused: No    Sexually Abused: No   Housing Stability: Low Risk     Unable to Pay for Housing in the Last Year: No    Number of Places Lived in the Last Year: 1    Unstable Housing in the Last Year: No     Allergies   Allergen Reactions    Codeine Nausea And Vomiting       MEDICATIONS:     Current Outpatient Medications:     amLODIPine (NORVASC) 5 MG tablet, Take 5 mg by mouth nightly, Disp: , Rfl:     brivaracetam (BRIVIACT) 100 MG Tab tablet, Take 1 tablet (100 mg total) by mouth every 12 (twelve) hours, Disp: 180 tablet, Rfl: 2    Fungoid Tincture 2 % Solution, APPLY TWICE A DAY TO TOE NAIL, Disp: , Rfl:  levothyroxine (SYNTHROID) 88 MCG tablet, Take 1 tablet (88 mcg total) by mouth every morning, Disp: 90 tablet, Rfl: 3    lisinopril (ZESTRIL) 10 MG tablet, TAKE 1 TABLET ORALLY ONCE A DAY EVERY MORNING FOR 30 DAY(S), Disp: 30 tablet, Rfl: 3    rosuvastatin (CRESTOR) 20 MG tablet, Take 1 tablet  (20 mg total) by mouth nightly, Disp: 90 tablet, Rfl: 3    urea (CARMOL) 40 % cream, APPLY 1 GRAM ONCE A DAY TO NAILS AND CALLUSES, Disp: , Rfl:     ROS:   Review of Systems   Constitutional:  Positive for appetite change. Negative for chills and fever.   Respiratory:  Negative for cough and shortness of breath.    Cardiovascular:  Negative for chest pain and leg swelling.   Gastrointestinal:  Positive for constipation (occasional). Negative for diarrhea, nausea and vomiting.   Genitourinary: Negative.    Musculoskeletal:  Positive for arthralgias (Left foot toe pain) and gait problem. Negative for myalgias.   Skin:  Negative for rash.   Neurological:  Positive for seizures (None recently). Negative for dizziness, syncope and weakness.   Psychiatric/Behavioral:  Positive for agitation (Per dtr, while in [place]). Negative for sleep disturbance. The patient is not nervous/anxious.      All other systems pertinent to the chief complaint reviewed and otherwise negative     PHYSICAL EXAM:   BP 128/68    Pulse 85    Temp 98.2 F (36.8 C)    Resp 18    SpO2 99%    Wt Readings from Last 1 Encounters:   05/08/21 61.2 kg (135 lb)      Ht Readings from Last 1 Encounters:   05/08/21 1.6 m (5\' 3" )     Physical Exam  Constitutional:       General: She is not in acute distress.     Appearance: She is not ill-appearing or toxic-appearing.   HENT:      Head: Normocephalic and atraumatic.   Cardiovascular:      Rate and Rhythm: Normal rate and regular rhythm.      Pulses: Normal pulses.      Heart sounds: Murmur (3/6 pan-SEM LSB) heard.   Pulmonary:      Effort: Pulmonary effort is normal. No respiratory distress.      Breath sounds: Normal breath sounds.   Musculoskeletal:      Right lower leg: No edema.      Left lower leg: No edema.   Skin:     General: Skin is warm and dry.      Findings: No rash.   Neurological:      General: No focal deficit present.      Mental Status: She is alert and oriented to person, place, and time.    Psychiatric:         Mood and Affect: Mood and affect normal.      Comments: Admits to feeling sad about loss of husband and move from [place] to [place]     DIAGNOSTICS:     Lab Results   Component Value Date    WBC 7.50 03/11/2021    HGB 11.2 (L) 03/11/2021    HCT 32.3 (L) 03/11/2021    PLT 186 03/11/2021     Lab Results   Component Value Date    CHOL 121 03/10/2021    TRIG 96 03/10/2021    HDL 35 (L) 03/10/2021    LDL 67 03/10/2021     Lab  Results   Component Value Date    ALT 21 03/09/2021    AST 29 03/09/2021    NA 135 (L) 03/21/2021    K 4.0 03/21/2021    CL 101 03/21/2021    CREAT 0.8 03/21/2021    BUN 15.0 03/21/2021    CO2 29 03/21/2021    ALKPHOS 40 03/09/2021     Lab Results   Component Value Date    GLU 110 (H) 03/21/2021    GLUCOSEWB 104 (H) 03/09/2021    HGBA1C 5.6 03/10/2021     Lab Results   Component Value Date    TSH 1.08 03/11/2021    INR 1.0 03/10/2021       No results found.    ASSESSMENT and PLAN:   Situational depression  A/P:  We discussed that she could have situational depression because of the lose of her husband and the lose of her home and friends in [place].  We discussed a trial of mirtazapine which I explained could also help with her appetite.  I would like to speak with neurologist re prescribing mirtazapine along with brivaracetam (Briviact) and left message for Freddy Jaksch, NP [phone]) who last saw her.  Her neurologist is in same office - Rosalio Macadamia, MD.    Temporal lobe epilepsy  A/P:  Follow-up with Dr. Becky Sax.  Seizure precautions.   Advised patient to use walker for safety.  DIscussed driving and told her she should not drive at this time.    Nontraumatic acute subdural hemorrhage  A/P: Continue fall/seizure precautions.  Advised to use walker.    Constipation  A/P: Drinks prune juice daily which helps.      Toe pain, left  A/P:  Will refer to Dr. Gardiner Rhyme, podiatrist, who does house calls.    Post-COVID syndrome resolved  A/P: Positive Covid-19, asymptomatic.   Resolved.      Orders Placed This Encounter   Procedures    Ambulatory referral to Podiatry     Referral Priority:   Routine     Referral Type:   Consultation     Referral Reason:   Specialty Services Required     Requested Specialty:   Podiatric Surgery     Number of Visits Requested:   1       Electronically Signed by Zetta Bills, NP

## 2021-07-13 NOTE — Assessment & Plan Note (Signed)
A/P: Positive Covid-19, asymptomatic.  Resolved.

## 2021-07-13 NOTE — Assessment & Plan Note (Signed)
A/P:  Follow-up with Dr. Becky Sax.  Seizure precautions.   Advised patient to use walker for safety.  DIscussed driving and told her she should not drive at this time.

## 2021-07-13 NOTE — Assessment & Plan Note (Signed)
A/P:  Will refer to Dr. Gardiner Rhyme, podiatrist, who does house calls.

## 2021-07-13 NOTE — Assessment & Plan Note (Addendum)
A/P:  We discussed that she could have situational depression because of the lose of her husband and the lose of her home and friends in [place].  We discussed a trial of mirtazapine which I explained could also help with her appetite.  I would like to speak with neurologist re prescribing mirtazapine along with brivaracetam (Briviact) and left message for Freddy Jaksch, NP [phone]) who last saw her.  Her neurologist is in same office - Rosalio Macadamia, MD.

## 2021-07-13 NOTE — Assessment & Plan Note (Signed)
A/P: Continue fall/seizure precautions.  Advised to use walker.

## 2021-07-13 NOTE — Assessment & Plan Note (Signed)
A/P: Drinks prune juice daily which helps.

## 2021-07-15 ENCOUNTER — Telehealth (INDEPENDENT_AMBULATORY_CARE_PROVIDER_SITE_OTHER): Payer: Self-pay | Admitting: Nurse Practitioner

## 2021-07-15 NOTE — Telephone Encounter (Signed)
-----   Message from Zetta Bills, NP sent at 07/13/2021  7:18 PM EST -----  I put in referral for her to see Dr. Zettie Cooley, podiatrist.  Please send referral to him.  Will his office call her?  If not, could you please call her with his contact info?  TY

## 2021-07-15 NOTE — Telephone Encounter (Signed)
Podiatry referral emailed to Dr.Ling.

## 2021-07-15 NOTE — Telephone Encounter (Signed)
Spoke with dtr, [name], and advised that referral has been submitted to Dr. Zettie Cooley (podiatrist) and still trying to reach Freddy Jaksch, NP re antidepressant.  Will continue trying.

## 2021-07-17 ENCOUNTER — Telehealth: Payer: Self-pay

## 2021-07-17 NOTE — Telephone Encounter (Signed)
Received 2 voicemails from NP Barlet on 07/11/21 and 07/15/21 regarding starting pt on antidepressant for depression. Np Barlet wanted to verify if medication is ok to take with seizure medication - briviact.     RN called [phone] and spoke with NP Barlet. RN stated antidepressants are ok to take with seizures medications except Wellbutrin. NP Barlet gave verbal understanding. NP states she wants to start pt on mirtazapine 7.5mg  daily.    FYI

## 2021-07-18 ENCOUNTER — Telehealth (INDEPENDENT_AMBULATORY_CARE_PROVIDER_SITE_OTHER): Payer: Self-pay | Admitting: Nurse Practitioner

## 2021-07-18 ENCOUNTER — Encounter (INDEPENDENT_AMBULATORY_CARE_PROVIDER_SITE_OTHER): Payer: Medicare Other | Admitting: Nurse Practitioner

## 2021-07-18 DIAGNOSIS — F32A Depression, unspecified: Secondary | ICD-10-CM

## 2021-07-18 MED ORDER — MIRTAZAPINE 7.5 MG PO TABS
7.5000 mg | ORAL_TABLET | Freq: Every evening | ORAL | 1 refills | Status: DC
Start: 2021-07-18 — End: 2021-08-09

## 2021-07-18 NOTE — Telephone Encounter (Signed)
Heard from neurologist's office - approved use of mirtazapine along with Briviact.mm

## 2021-07-18 NOTE — Telephone Encounter (Signed)
Heard from neurologist's office - approved use of mirtazapine with Briviact.  Rx for mirtazapine 7.5mg  one tab  by  mouth each evening eRx to pharmacy.  Dtr, [name], notified.

## 2021-07-30 ENCOUNTER — Telehealth (INDEPENDENT_AMBULATORY_CARE_PROVIDER_SITE_OTHER): Payer: Self-pay | Admitting: Nurse Practitioner

## 2021-07-30 NOTE — Telephone Encounter (Signed)
Called patient's daughter [name], she said that she is actually going to see if podiatrist that she goes to church with is willing to administer patient's injections in her toes, if not she will take patient to a podiatry office.

## 2021-07-30 NOTE — Telephone Encounter (Signed)
-----   Message from Zetta Bills, NP sent at 07/24/2021  1:39 PM EST -----  Regarding: Podiatrist  Hi,    When you have a chance, could you please call the daughter and give her the names of the two podiatrists that we're going to refer to going forward? One is a Engineer, civil (consulting) and the other is a doctor. Mom will need an injection, and I suspect the nurse would not be able to do that. I don't think either one is covered by insurance, just so the daughter knows. It might be a better option for them to go to see someone in an office instead, which might be covered by insurance.    Thank you,    Carlena Sax

## 2021-08-06 ENCOUNTER — Ambulatory Visit: Payer: Medicare Other | Attending: Neurology | Admitting: Neurology

## 2021-08-06 ENCOUNTER — Ambulatory Visit: Payer: Medicare Other | Admitting: Nurse Practitioner

## 2021-08-06 ENCOUNTER — Encounter: Payer: Self-pay | Admitting: Neurology

## 2021-08-06 VITALS — BP 147/76 | HR 76 | Temp 98.2°F | Resp 15 | Ht 63.0 in | Wt 141.0 lb

## 2021-08-06 DIAGNOSIS — S065X9A Traumatic subdural hemorrhage with loss of consciousness of unspecified duration, initial encounter: Secondary | ICD-10-CM | POA: Insufficient documentation

## 2021-08-06 DIAGNOSIS — G40109 Localization-related (focal) (partial) symptomatic epilepsy and epileptic syndromes with simple partial seizures, not intractable, without status epilepticus: Secondary | ICD-10-CM | POA: Insufficient documentation

## 2021-08-06 DIAGNOSIS — S06359A Traumatic hemorrhage of left cerebrum with loss of consciousness of unspecified duration, initial encounter: Secondary | ICD-10-CM | POA: Insufficient documentation

## 2021-08-06 DIAGNOSIS — S065XAA Traumatic subdural hemorrhage with loss of consciousness status unknown, initial encounter: Secondary | ICD-10-CM | POA: Insufficient documentation

## 2021-08-06 DIAGNOSIS — R569 Unspecified convulsions: Secondary | ICD-10-CM | POA: Insufficient documentation

## 2021-08-06 DIAGNOSIS — G936 Cerebral edema: Secondary | ICD-10-CM | POA: Insufficient documentation

## 2021-08-06 MED ORDER — BRIVARACETAM 50 MG PO TABS
ORAL_TABLET | ORAL | 1 refills | Status: DC
Start: 2021-08-06 — End: 2021-08-14

## 2021-08-06 NOTE — Progress Notes (Signed)
Subjective:       Please see detailed assessment and plan    Patient ID: [name] is a 88 y.o. female visiting from [place] hx HTN, HLD, hypothyroid, irregular heartbeat, sternotomy for aortic valve replacement >10 years ago then repeat aortic valve replacement 5-6 years ago (procedure sounds like TAVR), intracerebral hemorrhage after fall 2021 [facility] [place] - no residual deficit per daughter), trauma admit 8/15-8/18 for left temporal and smaller rigth temporal hemorrhagic contusion + left greater than right SDH (only right SDH is along tentorial leaflet) + IVH  here for Neurologic Problem  .    HPI  Background from Dr. Wilburt Finlay discharge note 02/2021 "88 y.o. female visiting from [place] hx HTN, HLD, hypothyroid, irregular heartbeat, sternotomy for aortic valve replacement >10 years ago then repeat aortic valve replacement 5-6 years ago (procedure sounds like TAVR), intracerebral hemorrhage after fall 2021 [facility] [place], [place] - no residual deficit per daughter), trauma admit 8/15-8/18 for left temporal and smaller rigth temporal hemorrhagic contusion + left greater than right SDH (only right SDH is along tentorial leaflet) + IVH who presents to the hospital with expressive and receptive aphasia. She was doing well after the trauma admit until ~10:30 today when at brunch it was difficult to get her attention even when calling out her name. Later she was found sitting with her head down in her hands and again it was difficult to get her attention. When she spoke there was word finding difficulty and some word salad. She appeared to have expressive and receptive aphasia for me. She denies headache. These symptoms are sudden onset, moderate intensity, without alleviating factors.    Her daughter also noted one episode of right hand shaking last admission making it difficult for her to use a spoon. Her daughter has noted infrequent right facial twitching which started  prior to the trauma admission."    Of note: Once patient was placed on briviact, her coomunication issues were gone.  For about 5 years before she was having right sided facial twitching,  which also stopped after starting Briviact.  Next CT scheduled 10/18/ 2022 with followup with neurosurgery..  Mother usually lives in [place] by herself and is here now to stay.  Emphasized no driving for at least 6 months per state law.    cvEEG 03/12/2021  IMPRESSION: Abnormal EEG due to:   1. Occasional left temporal intermittent rhythmic delta activity (TIRDA)   2. Left anterior to midtemporal focal slowing    MRI Brain 03/10/2021  IMPRESSION:   1. No acute infarct.   2. Evolution and decreased size of hematoma in the lateral left temporal lobe and smaller hemorrhagic contusion in the right parietal lobe with surrounding edema as detailed. No interval hemorrhage compared to the prior 03/04/2021 MRI  3. Stable left subdural hematoma and sequela of prior subarachnoid hemorrhage as detailed above.  4. Mild global volume loss, chronic small vessel ischemic changes and multiple chronic cerebellar infarcts.     CT head 03/21/2021  IMPRESSION:    1.  Interval resolution of parenchymal hemorrhage associated with a prior left MCA/temporal lobe infarct since prior noncontrast head CT.   2.  Improved subdural hematoma along the left tentorial leaflet and posterior cerebral falx with only trace residual.   3.  No new hemorrhage or evidence of a new large vascular territory infarct.  4.  Otherwise similar chronic findings, as above, as on prior imaging.    Review of Systems  No other motor, sensory, vision, cognitive, speech, swallow, bowel, or bladder changes; no tremors, convulsions, seizure activity, or loss of consciousness episodes.  All other systems were reviewed and were negative.     Current Outpatient Medications on File Prior to Visit   Medication Sig Dispense Refill    amLODIPine (NORVASC) 5 MG tablet Take 5 mg by mouth nightly       brivaracetam (BRIVIACT) 100 MG Tab tablet Take 1 tablet (100 mg total) by mouth every 12 (twelve) hours 180 tablet 2    Fungoid Tincture 2 % Solution APPLY 1ML TWICE A DAY TO TOE NAIL      levothyroxine (SYNTHROID) 88 MCG tablet Take 1 tablet (88 mcg total) by mouth every morning 90 tablet 3    lisinopril (ZESTRIL) 10 MG tablet TAKE 1 TABLET ORALLY ONCE A DAY EVERY MORNING FOR 30 DAY(S) 30 tablet 3    mirtazapine (REMERON) 7.5 MG tablet Take 1 tablet (7.5 mg) by mouth nightly 30 tablet 1    rosuvastatin (CRESTOR) 20 MG tablet Take 1 tablet (20 mg total) by mouth nightly 90 tablet 3    urea (CARMOL) 40 % cream APPLY 1 GRAM ONCE A DAY TO NAILS AND CALLUSES       No current facility-administered medications on file prior to visit.     Current/Home Medications    AMLODIPINE (NORVASC) 5 MG TABLET    Take 5 mg by mouth nightly    BRIVARACETAM (BRIVIACT) 100 MG TAB TABLET    Take 1 tablet (100 mg total) by mouth every 12 (twelve) hours    FUNGOID TINCTURE 2 % SOLUTION    APPLY 1ML TWICE A DAY TO TOE NAIL    LEVOTHYROXINE (SYNTHROID) 88 MCG TABLET    Take 1 tablet (88 mcg total) by mouth every morning    LISINOPRIL (ZESTRIL) 10 MG TABLET    TAKE 1 TABLET ORALLY ONCE A DAY EVERY MORNING FOR 30 DAY(S)    MIRTAZAPINE (REMERON) 7.5 MG TABLET    Take 1 tablet (7.5 mg) by mouth nightly    ROSUVASTATIN (CRESTOR) 20 MG TABLET    Take 1 tablet (20 mg total) by mouth nightly    UREA (CARMOL) 40 % CREAM    APPLY 1 GRAM ONCE A DAY TO NAILS AND CALLUSES     Allergies   Allergen Reactions    Codeine Nausea And Vomiting      Patient Active Problem List    Diagnosis Date Noted    Traumatic subdural hemorrhage with loss of consciousness of unspecified duration, initial encounter [S06.5X9A] 08/06/2021    Situational depression [F43.21] 07/13/2021    Post-COVID syndrome resolved [Z86.16] 07/13/2021    Toe pain, left [M79.675] 05/07/2021    Constipation [K59.00] 05/07/2021    Hypertension [I10] 05/07/2021    Hyperlipidemia [E78.5] 05/07/2021     Palpitations [R00.2] 04/21/2021    S/p TAVR (transcatheter aortic valve replacement), 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] 04/18/2021    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] 04/18/2021    Hypothyroidism [E03.9] 04/18/2021    Osteoporosis [M81.0] 04/18/2021    Recurrent UTI [N39.0] 04/18/2021    Gait abnormality [R26.9] 04/18/2021    Cerebral edema [G93.6] 03/10/2021    Left temporal and right parietal hemorrhagic contusions on MRI 03/04/21 [I61.9] 03/10/2021    Subdural hematoma [S06.5XAA] 03/10/2021    Hyponatremia [E87.1] 03/10/2021    Aphasia [R47.01] 03/09/2021    Nontraumatic acute subdural hemorrhage [I62.01] 03/03/2021     Past Surgical History:   Procedure Laterality Date  AORTIC VALVE REPLACEMENT      first AVR required sternotomy, >10 years ago per patient    BACK SURGERY      HYSTERECTOMY      REPLACEMENT TOTAL KNEE Bilateral     left total, right "half knee" replacement    TRANSCATHETER AORTIC VALVE REPLACEMENT  2016    5-6 years ago per patient    WRIST SURGERY Left 2017     Social History     Socioeconomic History    Marital status: Widowed   Tobacco Use    Smoking status: Never    Smokeless tobacco: Never   Vaping Use    Vaping Use: Never used   Substance and Sexual Activity    Alcohol use: Not Currently     Alcohol/week: 3.0 standard drinks     Types: 3 Glasses of wine per week     Comment: 1-2 a week    Drug use: Never     Social Determinants of Health     Financial Resource Strain: Low Risk     Difficulty of Paying Living Expenses: Not hard at all   Food Insecurity: No Food Insecurity    Worried About Programme researcher, broadcasting/film/video in the Last Year: Never true    Barista in the Last Year: Never true   Transportation Needs: No Transportation Needs    Lack of Transportation (Medical): No    Lack of Transportation (Non-Medical): No   Physical Activity: Unknown    Days of Exercise per Week: 0 days   Stress: No Stress Concern Present    Feeling of Stress : Not at all   Social Connections: Moderately Integrated     Frequency of Communication with Friends and Family: More than three times a week    Frequency of Social Gatherings with Friends and Family: More than three times a week    Attends Religious Services: More than 4 times per year    Active Member of [employer] or Organizations: Yes    Attends Banker Meetings: More than 4 times per year    Marital Status: Widowed   Catering manager Violence: Not At Risk    Fear of Current or Ex-Partner: No    Emotionally Abused: No    Physically Abused: No    Sexually Abused: No   Housing Stability: Low Risk     Unable to Pay for Housing in the Last Year: No    Number of Places Lived in the Last Year: 1    Unstable Housing in the Last Year: No     Family History   Problem Relation Age of Onset    Stroke Mother     Anuerysm Mother     Cancer Father     Heart disease Sister     Heart disease Brother     Intracerebral hemorrhage Neg Hx      She  has a past medical history of Bleeding in head following injury with loss of consciousness, Convulsions, Hyperlipidemia, Hypertension, Hypothyroidism, and Irregular heartbeat.        Objective:      Physical Exam Neurologic Exam    Vital Signs:  Reviewed    General: Well developed and well nourished. No acute distress. Cooperative with the exam  ENT: Normal oral mucosa, no ear or nose discharge  Neck: Symmetric, no deformities  CV: RRR  Resp: No audible wheezing, normal work of breathing  Abd: Soft, nondistended  Skin: Intact, extremities normal in color  Psych: Affect is normal, good insight    Mental Status: The patient is awake, alert and oriented to person, place, and time.  Affect is normal  Fund of knowledge appropriate  Recent and remote memory are intact   Attention span and concentration appear normal.  Language function is normal. There is no evidence of aphasia in conversational speech.    Cranial nerves:   -CN II: Visual fields full to bedside confrontation   -CN III, IV, VI: Pupils equal, round, and reactive to light;  extraocular movements intact; no ptosis              -CN V: Facial sensation intact in V1 through V3 distributions   -CN VII: Face symmetric   -CN VIII: Hearing intact to conversational speech   -CN IX, X: Palate elevates symmetrically; normal phonation   -CN XI: Symmetric full strength of sternocleidomastoid and trapezius muscles   -CN XII: Tongue protrudes midline    Motor: Muscle tone normal without spasticity or flaccidity. No atrophy.  No pronator drift.     UEs:   Deltoid Bicep Tricep WE WF Grip IO   Right 5 5 5 5 5 5 5    Left 5 5 5 5 5 5 5      LEs   HF HE KF KE PF DF   Right 5 5 5 5 5 5    Left 5 5 5 5 5 5      Sensory:   Light touch intact.  Temperature intact.  Vibration intact.    Reflexes:      B T BR P A   Right 2 2 2 2 2    Left 2 2 2 2 2      Plantars:    Coordination: FTN intact, no truncal ataxia. RAMs intact. 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 female visiting from [place] hx HTN, HLD, hypothyroid, irregular heartbeat, sternotomy for aortic valve replacement >10 years ago then repeat aortic valve replacement 5-6 years ago (procedure sounds like TAVR), intracerebral hemorrhage after fall 2021 [facility] [place], [place] - no residual deficit per daughter), trauma admit 8/15-8/18 for left temporal and smaller rigth temporal hemorrhagic contusion + left greater than right SDH (only right SDH is along tentorial leaflet) + IVH  here for Seizures    08/06/2021 visit:  Patient and family deny seizures since last visit.  Patient has been having eye twitching  that has been coming and going.  Initially the Briviact helped with the eye twitching then eye twitching returned then went away again.  When asked about PT for gait disturbance, it was noted that patient did not start PT.  Patient went to [place] for a lengthy visit and while her daughter noted that the patient's balance continues to be poor, the daughter noted that patient's shoes are very large and slide around on her.  Daughter reported that she will get the patient thicker socks.  Daughter reports that the patient is  pretty steady using her walker, however, does not use her walker at home and uses a wheelchair when out for longer trips.        Recommend:  Reduce Briviact to 50 mg in Am and 100 mg in PM refills sent to pharmacy  Continue seizure precautions including 6 months driving restriction after last seizure  Follow up with Dr. Becky Sax in 6 months or sooner if needed        Plan:      No orders of the defined types were placed in this encounter.    All relevant and clinical information was transcribed by me, Norvel Richards. Clemon Chambers, NP, acting as a scribe for Dr. Rosalio Macadamia.    Agree     Additional notes and data scanned including patient questionnaire which may contain pertinent information to visit. Patient can follow up sooner if needed. In the meantime, patient will contact the office with any questions or concerns.     Total of 40 minutes were spent on the day of service including face-to-face time with patient, coordinating care, record review and documentation, explaining the natural history of seizures, types of seizures, triggers for seizures.  Need for the necessary testing.  EEG and MRI.  Need for a seizure medication compliance for medications.  Seizure precautions including avoid driving per [place].  6 months from the last seizure.  Side effects of seizure medications.  Avoiding triggers for seizures.  Backup plan, and the time of partial seizure and convulsive seizures.  Including  going to the emergency room.  Taking emergency rescue medications.           Richrd Humbles, MD. FAES    Director, [facility]  Assistant Professor, [facility]  Board Certified,Neurology  Board Certified, Clinical Neurophysiology    [email or web address]      This note was generated by the Epic EMR system/ Dragon speech recognition and may contain inherent errors or omissions not intended by the user. Grammatical errors, random word insertions, deletions, pronoun errors and incomplete sentences are occasional consequences of this technology due to software limitations. Not all errors are caught or corrected.Although every attempt is made to root out erroneus and incomplete transcription, the note may still not fully represent the intent or opinion of the author. If there are questions or concerns about the content of this note or information contained within the body of this dictation they should be addressed directly with the author for clarification.*

## 2021-08-07 ENCOUNTER — Encounter (INDEPENDENT_AMBULATORY_CARE_PROVIDER_SITE_OTHER): Payer: Self-pay | Admitting: Nurse Practitioner

## 2021-08-09 ENCOUNTER — Telehealth (INDEPENDENT_AMBULATORY_CARE_PROVIDER_SITE_OTHER): Payer: Self-pay | Admitting: Nurse Practitioner

## 2021-08-09 ENCOUNTER — Other Ambulatory Visit (INDEPENDENT_AMBULATORY_CARE_PROVIDER_SITE_OTHER): Payer: Self-pay | Admitting: Nurse Practitioner

## 2021-08-09 ENCOUNTER — Encounter (INDEPENDENT_AMBULATORY_CARE_PROVIDER_SITE_OTHER): Payer: Self-pay | Admitting: Nurse Practitioner

## 2021-08-09 DIAGNOSIS — F32A Depression, unspecified: Secondary | ICD-10-CM

## 2021-08-09 MED ORDER — MIRTAZAPINE 7.5 MG PO TABS
7.5000 mg | ORAL_TABLET | Freq: Every evening | ORAL | 3 refills | Status: DC
Start: 2021-08-09 — End: 2021-09-11

## 2021-08-09 NOTE — Telephone Encounter (Signed)
Spoke with dtr re request for mirtazapine 7.5mg  refill.  Stated her mother's mood and appetite much improved.  Has gained 6 lbs.  Is "foggy" until about 11am but would like to try another month to see if this clears, given her good response.  Sent refill for 30 days, 3 additional refills.  Will see pt Thursday for f/u.

## 2021-08-14 ENCOUNTER — Encounter (INDEPENDENT_AMBULATORY_CARE_PROVIDER_SITE_OTHER): Payer: Self-pay | Admitting: Nurse Practitioner

## 2021-08-14 ENCOUNTER — Ambulatory Visit: Payer: Medicare Other | Admitting: Nurse Practitioner

## 2021-08-14 DIAGNOSIS — F32A Depression, unspecified: Secondary | ICD-10-CM

## 2021-08-14 DIAGNOSIS — R569 Unspecified convulsions: Secondary | ICD-10-CM | POA: Insufficient documentation

## 2021-08-14 DIAGNOSIS — R5383 Other fatigue: Secondary | ICD-10-CM

## 2021-08-14 DIAGNOSIS — M810 Age-related osteoporosis without current pathological fracture: Secondary | ICD-10-CM

## 2021-08-14 DIAGNOSIS — R630 Anorexia: Secondary | ICD-10-CM

## 2021-08-14 DIAGNOSIS — F4321 Adjustment disorder with depressed mood: Secondary | ICD-10-CM

## 2021-08-14 DIAGNOSIS — K5901 Slow transit constipation: Secondary | ICD-10-CM

## 2021-08-14 MED ORDER — BRIVARACETAM 100 MG PO TABS
100.0000 mg | ORAL_TABLET | Freq: Every evening | ORAL | 3 refills | Status: DC
Start: 2021-08-14 — End: 2021-10-10

## 2021-08-14 MED ORDER — METAMUCIL MULTIHEALTH FIBER 63 % PO POWD
1.0000 | Freq: Every day | ORAL | 3 refills | Status: DC
Start: 2021-08-14 — End: 2022-02-01

## 2021-08-14 MED ORDER — BRIVARACETAM 50 MG PO TABS
50.0000 mg | ORAL_TABLET | Freq: Every morning | ORAL | 3 refills | Status: DC
Start: 2021-08-14 — End: 2021-10-10

## 2021-08-14 MED ORDER — CENTRUM SILVER 50+WOMEN PO TABS
1.0000 | ORAL_TABLET | Freq: Every day | ORAL | 3 refills | Status: AC
Start: 2021-08-14 — End: ?

## 2021-08-14 NOTE — Assessment & Plan Note (Signed)
A/P:  Continue taking Metamucil daily.  Hold for loose stools.

## 2021-08-14 NOTE — Assessment & Plan Note (Signed)
A/P: We discussed stopping mirtazapine but overall, mood is better and would rather not. Suggested taking mirtazapine earlier in the evening to see if this helps with morning grogginess.  She agreed.

## 2021-08-14 NOTE — Assessment & Plan Note (Signed)
A/P: Should aim for calcium 1200mg /day via diet and supplement and Vit D3 via supplement.

## 2021-08-14 NOTE — Assessment & Plan Note (Signed)
A/P:  Continue mirtazapine.

## 2021-08-14 NOTE — Assessment & Plan Note (Addendum)
A/P: Reduction in Briviact noted.  Follow up with Dr. Becky Sax in 6 months or as needed.  Driving restriction remains in place.  Magnesium recommended by neuro to help with facial twitching.

## 2021-08-14 NOTE — Assessment & Plan Note (Signed)
A/P: No change mirtazapine.  Will take earlier in evening.

## 2021-08-14 NOTE — Progress Notes (Signed)
Date: 08/14/2021    Patient Name: [name]    Patient was seen in their home (POS 12) in lieu of an office visit for the following reason:   Requires use of an assistive device in order to ambulate due to history of falls.        Code Status: DNAR    HPI:   [name] is a 88 y.o. F that I am here to address:    Problem   Loss of Appetite    Appetite improved on mirtazapine.  Has gained 6 pounds.     Fatigue    Associated with mirtazapine and depression.     Seizures    Continues to have right eye twitching.  Unknown what triggers it.  No falls. Saw Dr. Becky Sax last week and Briviact reduced to 50 mg in am and 100 mg in pm.  Goal is to get her completely off Briviact.       Situational Depression    Per daughter, pt's mood has been better since starting mirtazapine however still talks about wanting to go back to [place].  Having less outbursts.  Patient stated she is feeling better but groggy in the morning until about 11am.  Wakes up about 9am.  Feels less depressed.     Constipation    Constipation now infrequent.     Osteoporosis    Dexa bone density axial skeleton 05/01/21 indicates osteopenia left femoral neck and total left hip.  Normal bone mineral density of lumbar spine.  Taking Centrum Silver for 50+ Women but calcium and Vit D3 do not meet recommended daily requirement.         Review of Systems   Constitutional:  Positive for appetite change (improved) and fatigue (in the morning).   Respiratory:  Negative for cough.    Cardiovascular:  Negative for chest pain and leg swelling.   Gastrointestinal:  Negative for constipation, diarrhea and nausea.   Neurological:  Positive for seizures (focal, right eye twitching).   Psychiatric/Behavioral:  Negative for dysphoric mood. The patient is not nervous/anxious.      Past Medical History:   Diagnosis Date    Bleeding in head following injury with loss of consciousness     a year ago    Convulsions     Hyperlipidemia     Hypertension      Hypothyroidism     Irregular heartbeat      Past Surgical History:   Procedure Laterality Date    AORTIC VALVE REPLACEMENT      first AVR required sternotomy, >10 years ago per patient    BACK SURGERY      HYSTERECTOMY      REPLACEMENT TOTAL KNEE Bilateral     left total, right "half knee" replacement    TRANSCATHETER AORTIC VALVE REPLACEMENT  2016    5-6 years ago per patient    WRIST SURGERY Left 2017     Family History   Problem Relation Age of Onset    Stroke Mother     Anuerysm Mother     Cancer Father     Heart disease Sister     Heart disease Brother     Intracerebral hemorrhage Neg Hx      Social History     Tobacco Use    Smoking status: Never    Smokeless tobacco: Never   Vaping Use    Vaping Use: Never used   Substance Use Topics    Alcohol use: Not  Currently     Alcohol/week: 3.0 standard drinks     Types: 3 Glasses of wine per week     Comment: 1-2 a week    Drug use: Never     Allergies   Allergen Reactions    Codeine Nausea And Vomiting       MEDICATIONS:     Current Outpatient Medications:     amLODIPine (NORVASC) 5 MG tablet, Take 5 mg by mouth nightly, Disp: , Rfl:     brivaracetam (BRIVIACT) 100 MG Tab tablet, Take 1 tablet (100 mg) by mouth nightly, Disp: 90 tablet, Rfl: 3    Brivaracetam (BRIVIACT) 50 MG Tab tablet, Take 1 tablet (50 mg) by mouth every morning, Disp: 90 tablet, Rfl: 3    Fungoid Tincture 2 % Solution, APPLY TWICE A DAY TO TOE NAIL, Disp: , Rfl:     levothyroxine (SYNTHROID) 88 MCG tablet, Take 1 tablet (88 mcg total) by mouth every morning, Disp: 90 tablet, Rfl: 3    lisinopril (ZESTRIL) 10 MG tablet, TAKE 1 TABLET ORALLY ONCE A DAY EVERY MORNING FOR 30 DAY(S), Disp: 30 tablet, Rfl: 3    mirtazapine (REMERON) 7.5 MG tablet, Take 1 tablet (7.5 mg) by mouth nightly, Disp: 30 tablet, Rfl: 3    Multiple Vitamins-Minerals (Centrum Silver 50+Women) Tab, Take 1 tablet by mouth daily, Disp: 100 tablet, Rfl: 3    Psyllium (Metamucil MultiHealth Fiber) 63 % Powder, Take 1 scoop. by  mouth daily, Disp: 660 g, Rfl: 3    rosuvastatin (CRESTOR) 20 MG tablet, Take 1 tablet (20 mg total) by mouth nightly, Disp: 90 tablet, Rfl: 3    urea (CARMOL) 40 % cream, APPLY 1 GRAM ONCE A DAY TO NAILS AND CALLUSES, Disp: , Rfl:     PHYSICAL EXAM:   BP 122/58    Pulse 78    Temp 97.3 F (36.3 C)    Resp 18    SpO2 99%    Wt Readings from Last 1 Encounters:   08/06/21 64 kg (141 lb)      Ht Readings from Last 1 Encounters:   08/06/21 1.6 m (5\' 3" )     Physical Exam  Constitutional:       General: She is not in acute distress.     Appearance: She is not ill-appearing or toxic-appearing.   Cardiovascular:      Rate and Rhythm: Normal rate and regular rhythm.      Pulses: Normal pulses.      Heart sounds: Murmur (Gr 2-3/6 SEM) heard.   Pulmonary:      Effort: Pulmonary effort is normal. No respiratory distress.      Breath sounds: Normal breath sounds.   Abdominal:      General: Bowel sounds are normal. There is no distension.      Tenderness: There is no abdominal tenderness.   Musculoskeletal:      Right lower leg: No edema.      Left lower leg: No edema.   Neurological:      Mental Status: She is alert and oriented to person, place, and time.      Comments: Three episodes of right eye twiching during visit.  Last < 5 seconds.   Psychiatric:         Mood and Affect: Mood and affect normal.     DIAGNOSTICS:     Lab Results   Component Value Date    WBC 7.50 03/11/2021    HGB 11.2 (L) 03/11/2021  HCT 32.3 (L) 03/11/2021    PLT 186 03/11/2021    CHOL 121 03/10/2021    TRIG 96 03/10/2021    HDL 35 (L) 03/10/2021    LDL 67 03/10/2021    ALT 21 03/09/2021    AST 29 03/09/2021    NA 135 (L) 03/21/2021    K 4.0 03/21/2021    CL 101 03/21/2021    CREAT 0.8 03/21/2021    BUN 15.0 03/21/2021    CO2 29 03/21/2021    TSH 1.08 03/11/2021    INR 1.0 03/10/2021    GLU 110 (H) 03/21/2021    HGBA1C 5.6 03/10/2021     No results found.    ASSESSMENT and PLAN:   Situational depression  A/P: We discussed stopping mirtazapine but  overall, mood is better and would rather not. Suggested taking mirtazapine earlier in the evening to see if this helps with morning grogginess.  She agreed.    Loss of appetite  A/P: Continue mirtazapine.    Fatigue  A/P: No change mirtazapine.  Will take earlier in evening.    Seizures  A/P: Reduction in Briviact noted.  Follow up with Dr. Becky Sax in 6 months or as needed.  Driving restriction remains in place.  Magnesium recommended by neuro to help with facial twitching.    Osteoporosis  A/P: Should aim for calcium 1200mg /day via diet and supplement and Vit D3 via supplement.      Constipation  A/P:  Continue taking Metamucil daily.  Hold for loose stools.  Medications Ordered This Encounter         Disp Refills Start End    Brivaracetam (BRIVIACT) 50 MG Tab tablet 90 tablet 3 08/14/2021     Take 1 tablet (50 mg) by mouth every morning - Oral    Renewals       Renewal requests to authorizing provider Hortencia Pilar, Laural Benes, NP) <b>prohibited</b>            brivaracetam (BRIVIACT) 100 MG Tab tablet 90 tablet 3 08/14/2021     Take 1 tablet (100 mg) by mouth nightly - Oral    Renewals       Renewal requests to authorizing provider Hortencia Pilar, Laural Benes, NP) <b>prohibited</b>            Psyllium (Metamucil MultiHealth Fiber) 63 % Powder 660 g 3 08/14/2021     Take 1 scoop. by mouth daily - Oral    Multiple Vitamins-Minerals (Centrum Silver 50+Women) Tab 100 tablet 3 08/14/2021     Take 1 tablet by mouth daily - Oral          No orders of the defined types were placed in this encounter.    Alfreida was seen today for follow-up, depression, anorexia, fatigue, seizures, osteoporosis and constipation.    Diagnoses and all orders for this visit:    Situational depression    Loss of appetite    Fatigue due to depression    Seizures    Age-related osteoporosis without current pathological fracture    Slow transit constipation    Other orders  -     Brivaracetam (BRIVIACT) 50 MG Tab tablet; Take 1 tablet (50 mg) by mouth every morning  -      brivaracetam (BRIVIACT) 100 MG Tab tablet; Take 1 tablet (100 mg) by mouth nightly  -     Psyllium (Metamucil MultiHealth Fiber) 63 % Powder; Take 1 scoop. by mouth daily  -     Multiple Vitamins-Minerals (Centrum Silver 50+Women)  Tab; Take 1 tablet by mouth daily        Electronically Signed by Zetta Bills, NP

## 2021-09-08 ENCOUNTER — Encounter (INDEPENDENT_AMBULATORY_CARE_PROVIDER_SITE_OTHER): Payer: Self-pay | Admitting: Nurse Practitioner

## 2021-09-08 ENCOUNTER — Telehealth (INDEPENDENT_AMBULATORY_CARE_PROVIDER_SITE_OTHER): Payer: Self-pay | Admitting: Nurse Practitioner

## 2021-09-08 DIAGNOSIS — I10 Essential (primary) hypertension: Secondary | ICD-10-CM

## 2021-09-08 MED ORDER — MIRTAZAPINE 7.5 MG PO TABS
7.5000 mg | ORAL_TABLET | Freq: Every evening | ORAL | 2 refills | Status: DC
Start: 2021-09-08 — End: 2021-11-03

## 2021-09-08 MED ORDER — LISINOPRIL 10 MG PO TABS
ORAL_TABLET | ORAL | 2 refills | Status: DC
Start: 2021-09-08 — End: 2022-08-12

## 2021-09-08 NOTE — Telephone Encounter (Signed)
Refills for lisinopril and mirtazapine e scribed to pharmacy.  She will be in [place] for a few weeks.  Leaving 2/24.

## 2021-09-09 NOTE — Telephone Encounter (Signed)
error 

## 2021-09-10 ENCOUNTER — Encounter (INDEPENDENT_AMBULATORY_CARE_PROVIDER_SITE_OTHER): Payer: Self-pay | Admitting: Nurse Practitioner

## 2021-09-10 ENCOUNTER — Ambulatory Visit: Payer: Medicare Other | Admitting: Nurse Practitioner

## 2021-09-10 ENCOUNTER — Telehealth (INDEPENDENT_AMBULATORY_CARE_PROVIDER_SITE_OTHER): Payer: Self-pay | Admitting: Nurse Practitioner

## 2021-09-10 DIAGNOSIS — M542 Cervicalgia: Secondary | ICD-10-CM

## 2021-09-10 DIAGNOSIS — R630 Anorexia: Secondary | ICD-10-CM

## 2021-09-10 DIAGNOSIS — E038 Other specified hypothyroidism: Secondary | ICD-10-CM

## 2021-09-10 DIAGNOSIS — F4321 Adjustment disorder with depressed mood: Secondary | ICD-10-CM

## 2021-09-10 DIAGNOSIS — R569 Unspecified convulsions: Secondary | ICD-10-CM

## 2021-09-10 DIAGNOSIS — R42 Dizziness and giddiness: Secondary | ICD-10-CM

## 2021-09-10 NOTE — Telephone Encounter (Signed)
Dtr [name] called as pt forgot to ask a question at todays visit, Pt asks how long she can go to [place] then dtr got on phone call and said real question is how long pt can live alone in her home?    [name] [phone]

## 2021-09-10 NOTE — Telephone Encounter (Signed)
Patient's daughter called to request next appointment be after 10/21/2021.

## 2021-09-11 ENCOUNTER — Telehealth (INDEPENDENT_AMBULATORY_CARE_PROVIDER_SITE_OTHER): Payer: Self-pay | Admitting: Nurse Practitioner

## 2021-09-11 ENCOUNTER — Encounter (INDEPENDENT_AMBULATORY_CARE_PROVIDER_SITE_OTHER): Payer: Self-pay | Admitting: Nurse Practitioner

## 2021-09-11 DIAGNOSIS — M542 Cervicalgia: Secondary | ICD-10-CM | POA: Insufficient documentation

## 2021-09-11 NOTE — Assessment & Plan Note (Signed)
A/P:  Continue mirtazapine 7.5 mg by mouth nightly.

## 2021-09-11 NOTE — Assessment & Plan Note (Signed)
A/P: Follow up with Dr. Becky Sax in April.

## 2021-09-11 NOTE — Assessment & Plan Note (Addendum)
A/P:  Suggested she go back to usual pillow.  Can apply warm, moist to neck for 10-15 minutes several times a day and do gentle stretching.  Avoid tilting head back to avoid hyperextension.  Ok to get gentle massage.  May try topical analgesic such as BenGay or acetaminophen prn.  To call if worsens or no improvement.

## 2021-09-11 NOTE — Assessment & Plan Note (Signed)
A/P:  Continue mirtazapine.

## 2021-09-11 NOTE — Progress Notes (Signed)
Date: 09/10/2021    Patient Name: [facility]    Patient was seen in their home (POS 12) in lieu of an office visit for the following reason:   Requires use of an assistive device in order to ambulate due to history of falls.        Code Status: DNAR    HPI:   Keeshia Sanderlin is a 88 y.o. F that I am here to address the following:    Problem   Neck Pain, Bilateral Posterior    C/o stiff, achiness on either side of her neck for about two weeks. Moves head side to side slowly. Has been sleeping on new pillow and feels neck has been in an unusual position.  Has had a massage which improved stiffness and reduced discomfort.  Is improving.  Denied numbness, tingling or radiation.       Loss of Appetite    Appetite remains improved on mirtazapine.  No recent weight but said she has not lost.     Seizures    Right eye twitching now occurring rarely.  Sees Dr. Becky Sax for follow-up in April. Goal is to get her completely off Briviact.       Situational Depression    Patient mood continues to be "good."  No longer groggy in the morning.  Settling into her dtr's home and starting to paint again. [place]           Review of Systems   Constitutional:  Negative for appetite change (Continues to improve).   Gastrointestinal:  Negative for constipation.   Musculoskeletal:  Positive for neck pain and neck stiffness.   Neurological:  Negative for seizures.     Past Medical History:   Diagnosis Date    Bleeding in head following injury with loss of consciousness     a year ago    Convulsions     Hyperlipidemia     Hypertension     Hypothyroidism     Irregular heartbeat      Past Surgical History:   Procedure Laterality Date    AORTIC VALVE REPLACEMENT      first AVR required sternotomy, >10 years ago per patient    BACK SURGERY      HYSTERECTOMY      REPLACEMENT TOTAL KNEE Bilateral     left total, right "half knee" replacement    TRANSCATHETER AORTIC VALVE REPLACEMENT  2016    5-6  years ago per patient    WRIST SURGERY Left 2017     Family History   Problem Relation Age of Onset    Stroke Mother     Anuerysm Mother     Cancer Father     Heart disease Sister     Heart disease Brother     Intracerebral hemorrhage Neg Hx      Social History     Tobacco Use    Smoking status: Never    Smokeless tobacco: Never   Vaping Use    Vaping Use: Never used   Substance Use Topics    Alcohol use: Not Currently     Alcohol/week: 3.0 standard drinks     Types: 3 Glasses of wine per week     Comment: 1-2 a week    Drug use: Never     Allergies   Allergen Reactions    Codeine Nausea And Vomiting       MEDICATIONS:     Current Outpatient  Medications:     amLODIPine (NORVASC) 5 MG tablet, Take 5 mg by mouth nightly, Disp: , Rfl:     brivaracetam (BRIVIACT) 100 MG Tab tablet, Take 1 tablet (100 mg) by mouth nightly, Disp: 90 tablet, Rfl: 3    Brivaracetam (BRIVIACT) 50 MG Tab tablet, Take 1 tablet (50 mg) by mouth every morning, Disp: 90 tablet, Rfl: 3    Fungoid Tincture 2 % Solution, APPLY TWICE A DAY TO TOE NAIL, Disp: , Rfl:     levothyroxine (SYNTHROID) 88 MCG tablet, Take 1 tablet (88 mcg total) by mouth every morning, Disp: 90 tablet, Rfl: 3    lisinopril (ZESTRIL) 10 MG tablet, TAKE 1 TABLET ORALLY ONCE A DAY EVERY MORNING FOR 30 DAY(S), Disp: 90 tablet, Rfl: 2    mirtazapine (REMERON) 7.5 MG tablet, Take 1 tablet (7.5 mg) by mouth nightly, Disp: 30 tablet, Rfl: 2    Multiple Vitamins-Minerals (Centrum Silver 50+Women) Tab, Take 1 tablet by mouth daily, Disp: 100 tablet, Rfl: 3    Psyllium (Metamucil MultiHealth Fiber) 63 % Powder, Take 1 scoop. by mouth daily, Disp: 660 g, Rfl: 3    rosuvastatin (CRESTOR) 20 MG tablet, Take 1 tablet (20 mg total) by mouth nightly, Disp: 90 tablet, Rfl: 3    urea (CARMOL) 40 % cream, APPLY 1 GRAM ONCE A DAY TO NAILS AND CALLUSES, Disp: , Rfl:     PHYSICAL EXAM:   BP 128/75   Pulse 76   Temp 97.3 F (36.3 C)   Resp 18   SpO2 100%    Wt Readings from Last 1  Encounters:   08/06/21 64 kg (141 lb)      Ht Readings from Last 1 Encounters:   08/06/21 1.6 m (5\' 3" )     Physical Exam  Constitutional:       General: She is not in acute distress.     Appearance: She is not ill-appearing or toxic-appearing.   Neck:      Comments: Moves head slowly.  Able to touch ear to shoulder and chin to chest.  Mild tenderness either side of cervical spine or no crepitus.  Cardiovascular:      Rate and Rhythm: Normal rate and regular rhythm.      Pulses: Normal pulses.      Heart sounds: Murmur (Gr 2/6 SEM) heard.   Pulmonary:      Effort: Pulmonary effort is normal. No respiratory distress.      Breath sounds: Normal breath sounds.   Abdominal:      General: Bowel sounds are normal. There is no distension.      Tenderness: There is no abdominal tenderness.   Musculoskeletal:      Cervical back: Tenderness present.      Right lower leg: No edema.      Left lower leg: No edema.   Skin:     General: Skin is warm and dry.   Neurological:      Mental Status: She is alert and oriented to person, place, and time.   Psychiatric:         Mood and Affect: Mood and affect normal.     DIAGNOSTICS:     Lab Results   Component Value Date    WBC 7.50 03/11/2021    HGB 11.2 (L) 03/11/2021    HCT 32.3 (L) 03/11/2021    PLT 186 03/11/2021    CHOL 121 03/10/2021    TRIG 96 03/10/2021    HDL 35 (L)  03/10/2021    LDL 67 03/10/2021    ALT 21 03/09/2021    AST 29 03/09/2021    NA 135 (L) 03/21/2021    K 4.0 03/21/2021    CL 101 03/21/2021    CREAT 0.8 03/21/2021    BUN 15.0 03/21/2021    CO2 29 03/21/2021    TSH 1.08 03/11/2021    INR 1.0 03/10/2021    GLU 110 (H) 03/21/2021    HGBA1C 5.6 03/10/2021     No results found.    ASSESSMENT and PLAN:   Situational depression  A/P:  Continue mirtazapine 7.5 mg by mouth nightly.    Loss of appetite  A/P: Continue mirtazapine.    Seizures  A/P: Follow up with Dr. Becky Sax in April.    Neck pain, bilateral posterior  A/P:  Suggested she go back to usual pillow.  Can apply  warm, moist to neck for 10-15 minutes several times a day and do gentle stretching.  Avoid tilting head back to avoid hyperextension.  To call if worsens or no improvement.      No orders of the defined types were placed in this encounter.    Princessa was seen today for follow-up, depression, anorexia, seizures and neck pain.    Diagnoses and all orders for this visit:    Situational depression    Loss of appetite    Seizures    Neck pain, bilateral posterior        Electronically Signed by Zetta Bills, NP

## 2021-09-11 NOTE — Telephone Encounter (Signed)
Spoke with patient re her trip to [place].  She will be leaving Sat and returning 10/04/21.  Dtr in [place] will be staying with her.  She asked if she could stay at the house for a few days by herself.  She has fallen and forgotten to take meds as ordered when by herself.  Also doesn't eat as she should.  In light of this, I told her I didn't think being by herself, even for a few days, would be safe.

## 2021-09-12 ENCOUNTER — Encounter (INDEPENDENT_AMBULATORY_CARE_PROVIDER_SITE_OTHER): Payer: Medicare Other | Admitting: Nurse Practitioner

## 2021-09-16 NOTE — Telephone Encounter (Signed)
PCP adds she sent message to dtr, noted

## 2021-09-17 ENCOUNTER — Encounter (INDEPENDENT_AMBULATORY_CARE_PROVIDER_SITE_OTHER): Payer: Self-pay | Admitting: Nurse Practitioner

## 2021-09-26 ENCOUNTER — Ambulatory Visit: Payer: Medicare Other | Admitting: Neurology

## 2021-10-10 ENCOUNTER — Encounter: Payer: Self-pay | Admitting: Neurology

## 2021-10-10 ENCOUNTER — Ambulatory Visit: Payer: Medicare Other | Attending: Neurology | Admitting: Neurology

## 2021-10-10 VITALS — BP 117/73 | HR 77 | Temp 97.6°F | Resp 12 | Ht 63.0 in | Wt 146.0 lb

## 2021-10-10 DIAGNOSIS — R4701 Aphasia: Secondary | ICD-10-CM

## 2021-10-10 DIAGNOSIS — G936 Cerebral edema: Secondary | ICD-10-CM

## 2021-10-10 DIAGNOSIS — R569 Unspecified convulsions: Secondary | ICD-10-CM | POA: Insufficient documentation

## 2021-10-10 NOTE — Progress Notes (Signed)
Subjective:       Please see detailed assessment and plan    Patient ID: [name] is a 88 y.o. female visiting from [place] hx HTN, HLD, hypothyroid, irregular heartbeat, sternotomy for aortic valve replacement >10 years ago then repeat aortic valve replacement 5-6 years ago (procedure sounds like TAVR), intracerebral hemorrhage after fall 2021 [facility] [place], [place] - no residual deficit per daughter), trauma admit 8/15-8/18 for left temporal and smaller rigth temporal hemorrhagic contusion + left greater than right SDH (only right SDH is along tentorial leaflet) + IVH  here for Seizures (Follow up)  .    Current visit:     See detailed assessment / plan for the new developments since last visit and the respective new recommendations.     HPI  Background from Dr. Wilburt Finlay discharge note 02/2021 "89 y.o. female visiting from [place] hx HTN, HLD, hypothyroid, irregular heartbeat, sternotomy for aortic valve replacement >10 years ago then repeat aortic valve replacement 5-6 years ago (procedure sounds like TAVR), intracerebral hemorrhage after fall 2021 [facility] [place], [place] - no residual deficit per daughter), trauma admit 8/15-8/18 for left temporal and smaller rigth temporal hemorrhagic contusion + left greater than right SDH (only right SDH is along tentorial leaflet) + IVH who presents to the hospital with expressive and receptive aphasia. She was doing well after the trauma admit until ~10:30 today when at brunch it was difficult to get her attention even when calling out her name. Later she was found sitting with her head down in her hands and again it was difficult to get her attention. When she spoke there was word finding difficulty and some word salad. She appeared to have expressive and receptive aphasia for me. She denies headache. These symptoms are sudden onset, moderate intensity, without alleviating factors.    Her daughter also noted one episode of right hand  shaking last admission making it difficult for her to use a spoon. Her daughter has noted infrequent right facial twitching which started prior to the trauma admission."    Of note: Once patient was placed on briviact, her coomunication issues were gone.  For about 5 years before she was having right sided facial twitching,  which also stopped after starting Briviact.  Next CT scheduled 10/18/ 2022 with followup with neurosurgery..  Mother usually lives in [place] by herself and is here now to stay.  Emphasized no driving for at least 6 months per state law.    cvEEG 03/12/2021  IMPRESSION: Abnormal EEG due to:   1. Occasional left temporal intermittent rhythmic delta activity (TIRDA)   2. Left anterior to midtemporal focal slowing    MRI Brain 03/10/2021  IMPRESSION:   1. No acute infarct.   2. Evolution and decreased size of hematoma in the lateral left temporal lobe and smaller hemorrhagic contusion in the right parietal lobe with surrounding edema as detailed. No interval hemorrhage compared to the prior 03/04/2021 MRI  3. Stable left subdural hematoma and sequela of prior subarachnoid hemorrhage as detailed above.  4. Mild global volume loss, chronic small vessel ischemic changes and multiple chronic cerebellar infarcts.     CT head 03/21/2021  IMPRESSION:    1.  Interval resolution of parenchymal hemorrhage associated with a prior left MCA/temporal lobe infarct since prior noncontrast head CT.   2.  Improved subdural hematoma along the left tentorial leaflet and posterior cerebral falx with only trace residual.   3.  No new hemorrhage  or evidence of a new large vascular territory infarct.  4.  Otherwise similar chronic findings, as above, as on prior imaging.    Review of Systems  No other motor, sensory, vision, cognitive, speech, swallow, bowel, or bladder changes; no tremors, convulsions, seizure activity, or loss of consciousness episodes.  All other systems were reviewed and were negative.     Current  Outpatient Medications on File Prior to Visit   Medication Sig Dispense Refill    amLODIPine (NORVASC) 5 MG tablet Take 5 mg by mouth nightly      brivaracetam (BRIVIACT) 100 MG Tab tablet Take 1 tablet (100 mg) by mouth nightly 90 tablet 3    Brivaracetam (BRIVIACT) 50 MG Tab tablet Take 1 tablet (50 mg) by mouth every morning 90 tablet 3    Fungoid Tincture 2 % Solution APPLY TWICE A DAY TO TOE NAIL      levothyroxine (SYNTHROID) 88 MCG tablet Take 1 tablet (88 mcg total) by mouth every morning 90 tablet 3    lisinopril (ZESTRIL) 10 MG tablet TAKE 1 TABLET ORALLY ONCE A DAY EVERY MORNING FOR 30 DAY(S) 90 tablet 2    mirtazapine (REMERON) 7.5 MG tablet Take 1 tablet (7.5 mg) by mouth nightly 30 tablet 2    Multiple Vitamins-Minerals (Centrum Silver 50+Women) Tab Take 1 tablet by mouth daily 100 tablet 3    Psyllium (Metamucil MultiHealth Fiber) 63 % Powder Take 1 scoop. by mouth daily 660 g 3    rosuvastatin (CRESTOR) 20 MG tablet Take 1 tablet (20 mg total) by mouth nightly 90 tablet 3    urea (CARMOL) 40 % cream APPLY 1 GRAM ONCE A DAY TO NAILS AND CALLUSES       No current facility-administered medications on file prior to visit.     Current/Home Medications    AMLODIPINE (NORVASC) 5 MG TABLET    Take 5 mg by mouth nightly    BRIVARACETAM (BRIVIACT) 100 MG TAB TABLET    Take 1 tablet (100 mg) by mouth nightly    BRIVARACETAM (BRIVIACT) 50 MG TAB TABLET    Take 1 tablet (50 mg) by mouth every morning    FUNGOID TINCTURE 2 % SOLUTION    APPLY TWICE A DAY TO TOE NAIL    LEVOTHYROXINE (SYNTHROID) 88 MCG TABLET    Take 1 tablet (88 mcg total) by mouth every morning    LISINOPRIL (ZESTRIL) 10 MG TABLET    TAKE 1 TABLET ORALLY ONCE A DAY EVERY MORNING FOR 30 DAY(S)    MIRTAZAPINE (REMERON) 7.5 MG TABLET    Take 1 tablet (7.5 mg) by mouth nightly    MULTIPLE VITAMINS-MINERALS (CENTRUM SILVER 50+WOMEN) TAB    Take 1 tablet by mouth daily    PSYLLIUM (METAMUCIL MULTIHEALTH FIBER) 63 % POWDER    Take 1 scoop. by  mouth daily    ROSUVASTATIN (CRESTOR) 20 MG TABLET    Take 1 tablet (20 mg total) by mouth nightly    UREA (CARMOL) 40 % CREAM    APPLY 1 GRAM ONCE A DAY TO NAILS AND CALLUSES     Allergies   Allergen Reactions    Codeine Nausea And Vomiting      Patient Active Problem List    Diagnosis Date Noted    Neck pain, bilateral posterior [M54.2] 09/11/2021    Loss of appetite [R63.0] 08/14/2021    Fatigue [R53.83] 08/14/2021    Seizures [R56.9] 08/14/2021    Traumatic subdural hemorrhage with loss  of consciousness of unspecified duration, initial encounter [S06.5X9A] 08/06/2021    Situational depression [F43.21] 07/13/2021    Post-COVID syndrome resolved [Z86.16] 07/13/2021    Toe pain, left [M79.675] 05/07/2021    Constipation [K59.00] 05/07/2021    Hypertension [I10] 05/07/2021    Hyperlipidemia [E78.5] 05/07/2021    Palpitations [R00.2] 04/21/2021    S/p TAVR (transcatheter aortic valve replacement), 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] 04/18/2021    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] 04/18/2021    Hypothyroidism [E03.9] 04/18/2021    Osteoporosis [M81.0] 04/18/2021    Recurrent UTI [N39.0] 04/18/2021    Gait abnormality [R26.9] 04/18/2021    Cerebral edema [G93.6] 03/10/2021    Left temporal and right parietal hemorrhagic contusions on MRI 03/04/21 [I61.9] 03/10/2021    Subdural hematoma [S06.5XAA] 03/10/2021    Hyponatremia [E87.1] 03/10/2021    Aphasia [R47.01] 03/09/2021    Nontraumatic acute subdural hemorrhage [I62.01] 03/03/2021     Past Surgical History:   Procedure Laterality Date    AORTIC VALVE REPLACEMENT      first AVR required sternotomy, >10 years ago per patient    BACK SURGERY      HYSTERECTOMY      REPLACEMENT TOTAL KNEE Bilateral     left total, right "half knee" replacement    TRANSCATHETER AORTIC VALVE REPLACEMENT  2016    5-6 years ago per patient    WRIST SURGERY Left 2017     Social History     Socioeconomic History    Marital status: Widowed   Tobacco Use    Smoking status: Never    Smokeless tobacco: Never    Vaping Use    Vaping status: Never Used   Substance and Sexual Activity    Alcohol use: Not Currently     Alcohol/week: 3.0 standard drinks of alcohol     Types: 3 Glasses of wine per week     Comment: 1-2 a week    Drug use: Never     Social Determinants of Health     Financial Resource Strain: Low Risk  (04/08/2021)    Overall Financial Resource Strain (CARDIA)     Difficulty of Paying Living Expenses: Not hard at all   Food Insecurity: No Food Insecurity (04/08/2021)    Hunger Vital Sign     Worried About Running Out of Food in the Last Year: Never true     Ran Out of Food in the Last Year: Never true   Transportation Needs: No Transportation Needs (04/08/2021)    PRAPARE - Therapist, art (Medical): No     Lack of Transportation (Non-Medical): No   Physical Activity: Unknown (04/08/2021)    Exercise Vital Sign     Days of Exercise per Week: 0 days   Stress: No Stress Concern Present (04/08/2021)    [employer] of Occupational Health - Occupational Stress Questionnaire     Feeling of Stress : Not at all   Social Connections: Moderately Integrated (04/08/2021)    Social Connection and Isolation Panel [NHANES]     Frequency of Communication with Friends and Family: More than three times a week     Frequency of Social Gatherings with Friends and Family: More than three times a week     Attends Religious Services: More than 4 times per year     Active Member of [employer] or Organizations: Yes     Attends Banker Meetings: More than 4 times per year     Marital Status: Widowed  Intimate Partner Violence: Not At Risk (04/08/2021)    Humiliation, Afraid, Rape, and Kick questionnaire     Fear of Current or Ex-Partner: No     Emotionally Abused: No     Physically Abused: No     Sexually Abused: No   Housing Stability: Low Risk  (04/08/2021)    Housing Stability Vital Sign     Unable to Pay for Housing in the Last Year: No     Number of Places Lived in the Last Year: 1     Unstable  Housing in the Last Year: No     Family History   Problem Relation Age of Onset    Stroke Mother     Anuerysm Mother     Cancer Father     Heart disease Sister     Heart disease Brother     Intracerebral hemorrhage Neg Hx      She  has a past medical history of Bleeding in head following injury with loss of consciousness, Convulsions, Hyperlipidemia, Hypertension, Hypothyroidism, and Irregular heartbeat.        Objective:      Physical Exam Neurologic Exam    Vital Signs:  Reviewed    General: Well developed and well nourished. No acute distress. Cooperative with the exam  ENT: Normal oral mucosa, no ear or nose discharge  Neck: Symmetric, no deformities  CV: RRR  Resp: No audible wheezing, normal work of breathing  Abd: Soft, nondistended  Skin: Intact, extremities normal in color  Psych: Affect is normal, good insight    Mental Status: The patient is awake, alert and oriented to person, place, and time.  Affect is normal  Fund of knowledge appropriate  Recent and remote memory are intact   Attention span and concentration appear normal.  Language function is normal. There is no evidence of aphasia in conversational speech.    Cranial nerves:   -CN II: Visual fields full to bedside confrontation   -CN III, IV, VI: Pupils equal, round, and reactive to light; extraocular movements intact; no ptosis              -CN V: Facial sensation intact in V1 through V3 distributions   -CN VII: Face symmetric   -CN VIII: Hearing intact to conversational speech   -CN IX, X: Palate elevates symmetrically; normal phonation   -CN XI: Symmetric full strength of sternocleidomastoid and trapezius muscles   -CN XII: Tongue protrudes midline    Motor: Muscle tone normal without spasticity or flaccidity. No atrophy.  No pronator drift.     UEs:   Deltoid Bicep Tricep WE WF Grip IO   Right 5 5 5 5 5 5 5    Left 5 5 5 5 5 5 5      LEs   HF HE KF KE PF DF   Right 5 5 5 5 5 5    Left 5 5 5 5 5 5      Sensory:   Light touch intact.  Temperature  intact.  Vibration intact.    Reflexes:      B T BR P A   Right 2 2 2 2 2    Left 2 2 2 2 2      Plantars:    Coordination: FTN intact, no truncal ataxia. RAMs intact. 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 female visiting from [place] hx HTN, HLD, hypothyroid, irregular heartbeat, sternotomy for aortic valve replacement >10 years ago then repeat aortic valve replacement 5-6 years ago (procedure sounds like TAVR), intracerebral hemorrhage after fall 2021 [facility] [place], [place] - no residual deficit per daughter), trauma admit 8/15-8/18 for left temporal and smaller rigth temporal hemorrhagic contusion + left greater than right SDH (only right SDH is along tentorial leaflet) + IVH  here for Seizures    08/06/2021 visit:  Patient and family deny seizures since last visit.  Patient has been having eye twitching that has been coming and going.  Initially the Briviact helped with the eye twitching then eye twitching returned then went away again.  When asked about PT for gait disturbance, it was noted that patient did not start PT.  Patient went to [place] for a lengthy visit and while her daughter noted that the patient's balance continues to be poor, the daughter noted that patient's shoes are very large and slide around on her.  Daughter reported that she will get the patient thicker socks.  Daughter reports that the patient is pretty steady using her walker, however, does not use her walker at home and uses a wheelchair when out for longer trips.    10/10/21 visit:   reduced briviact helped a lot. Will add magnesium 400. No seizures or auras since last visit. No ER visits. Compliant with medications. Tolerating well. Compliance is good. Again readdressed/ discussed the need for AEDs continuation/ duration of medication. Refills sent to the requested pharmacy. Will follow up 6 monthly intervals. She will approach me through my chart if any concerns or questions in the mean time.    Time spent today (including but not limited to EEG review, neuroimaging review, previous clinical notes reviewed, discussion with the patient regarding the plan and documentation)  = 40 minutes    Currently Pertinent h/o negative for diplopia, dysphagia, dysarthria, facial drooping. Facial numbness. Focal numbness, focal weakness. Neck trauma, back trauma,radicular pain, gait disturbances, LOC episodes, LOA episodes, falls, bowel bladder disturbances.    More than 50% of this office visit was spent counseling the patient on one or more of the following:   Disease state - discussion about the medical condition, diagnostic and treatment options   Medications - indications and side effects   Lifestyle issues as appropriate.           Recommend:   Will add magnesium 400.   Continue Reduce Briviact to 50 mg in Am and 100 mg in PM refills sent to pharmacy  Continue seizure precautions including 6 months driving restriction after last seizure  Follow up with Dr. Becky Sax in 6 months or sooner if needed        Plan:      No orders of the defined types were placed in this encounter.    All relevant and clinical information was transcribed by me, Norvel Richards. Clemon Chambers, NP, acting as a scribe for Dr. Rosalio Macadamia.    Agree     Additional notes and data scanned including patient questionnaire which may contain pertinent  information to visit. Patient can follow up sooner if needed. In the meantime, patient will contact the office with any questions or concerns.     Total of 40 minutes were spent on the day of  service including face-to-face time with patient, coordinating care, record review and documentation, explaining the natural history of seizures, types of seizures, triggers for seizures.  Need for the necessary testing.  EEG and MRI.  Need for a seizure medication compliance for medications.  Seizure precautions including avoid driving per [place] state law.  6 months from the last seizure.  Side effects of seizure medications.  Avoiding triggers for seizures.  Backup plan, and the time of partial seizure and convulsive seizures.  Including going to the emergency room.  Taking emergency rescue medications.           Richrd HumblesMohan Khyle Goodell, MD. FAES    Director, [facility]  Assistant Professor, [facility]  Board Certified,Neurology  Board Certified, Clinical Neurophysiology    [email or web address]      This note was generated by the Epic EMR system/ Dragon speech recognition and may contain inherent errors or omissions not intended by the user. Grammatical errors, random word insertions, deletions, pronoun errors and incomplete sentences are occasional consequences of this technology due to software limitations. Not all errors are caught or corrected.Although every attempt is made to root out erroneus and incomplete transcription, the note may still not fully represent the intent or opinion of the author. If there are questions or concerns about the content of this note or information contained within the body of this dictation they should be addressed directly with the author for clarification.*

## 2021-10-12 ENCOUNTER — Encounter: Payer: Self-pay | Admitting: Neurology

## 2021-10-12 MED ORDER — BRIVARACETAM 100 MG PO TABS
100.0000 mg | ORAL_TABLET | Freq: Every evening | ORAL | 1 refills | Status: DC
Start: 2021-10-12 — End: 2022-01-06

## 2021-10-12 MED ORDER — BRIVARACETAM 50 MG PO TABS
50.0000 mg | ORAL_TABLET | Freq: Every morning | ORAL | 1 refills | Status: DC
Start: 2021-10-12 — End: 2022-04-09

## 2021-10-15 ENCOUNTER — Other Ambulatory Visit (INDEPENDENT_AMBULATORY_CARE_PROVIDER_SITE_OTHER): Payer: Medicare Other

## 2021-10-20 ENCOUNTER — Encounter (INDEPENDENT_AMBULATORY_CARE_PROVIDER_SITE_OTHER): Payer: Self-pay | Admitting: Cardiology

## 2021-10-20 ENCOUNTER — Other Ambulatory Visit (INDEPENDENT_AMBULATORY_CARE_PROVIDER_SITE_OTHER): Payer: Self-pay | Admitting: Cardiology

## 2021-10-20 DIAGNOSIS — Z953 Presence of xenogenic heart valve: Secondary | ICD-10-CM

## 2021-10-21 ENCOUNTER — Ambulatory Visit: Payer: Medicare Other | Admitting: Nurse Practitioner

## 2021-10-29 ENCOUNTER — Ambulatory Visit: Payer: Medicare Other | Admitting: Nurse Practitioner

## 2021-10-29 ENCOUNTER — Other Ambulatory Visit (INDEPENDENT_AMBULATORY_CARE_PROVIDER_SITE_OTHER): Payer: Self-pay | Admitting: Nurse Practitioner

## 2021-10-29 ENCOUNTER — Ambulatory Visit
Admission: RE | Admit: 2021-10-29 | Discharge: 2021-10-29 | Disposition: A | Discharge status: Home / Self Care | Payer: Medicare Other | Source: Ambulatory Visit | Attending: Nurse Practitioner | Admitting: Nurse Practitioner

## 2021-10-29 ENCOUNTER — Encounter (INDEPENDENT_AMBULATORY_CARE_PROVIDER_SITE_OTHER): Payer: Self-pay | Admitting: Nurse Practitioner

## 2021-10-29 VITALS — BP 118/78 | HR 73 | Temp 96.0°F | Resp 18 | Wt 143.0 lb

## 2021-10-29 DIAGNOSIS — B351 Tinea unguium: Secondary | ICD-10-CM | POA: Insufficient documentation

## 2021-10-29 DIAGNOSIS — M542 Cervicalgia: Secondary | ICD-10-CM

## 2021-10-29 DIAGNOSIS — F4321 Adjustment disorder with depressed mood: Secondary | ICD-10-CM

## 2021-10-29 DIAGNOSIS — R630 Anorexia: Secondary | ICD-10-CM

## 2021-10-29 DIAGNOSIS — R569 Unspecified convulsions: Secondary | ICD-10-CM

## 2021-10-29 DIAGNOSIS — R2681 Unsteadiness on feet: Secondary | ICD-10-CM

## 2021-10-29 MED ORDER — ACETAMINOPHEN ER 650 MG PO TBCR
650.0000 mg | EXTENDED_RELEASE_TABLET | Freq: Three times a day (TID) | ORAL | 0 refills | Status: AC | PRN
Start: 2021-10-29 — End: ?

## 2021-10-29 MED ORDER — MAGNESIUM 400 MG PO TABS
1.0000 | ORAL_TABLET | Freq: Every day | ORAL | 3 refills | Status: DC | PRN
Start: 2021-10-29 — End: 2022-04-09

## 2021-10-29 NOTE — Assessment & Plan Note (Signed)
A/P:  Continue mirtazapine.

## 2021-10-29 NOTE — Assessment & Plan Note (Signed)
A/P: Continue Fungoid Tincture 2% twice a day.

## 2021-10-29 NOTE — Progress Notes (Signed)
Date: 10/29/2021    Patient Name: [facility]    Patient was seen in their home (POS 12) in lieu of an office visit for the following reason:   Requires use of an assistive device in order to ambulate due to gait instability.        Code Status: DNAR    HPI:   [name] is a 88 y.o. F that I am here to address the following:    Problem   Onychomycosis    Using Fungoid Tincture twice a day and keeping nails well trimmed.  Doesn't feel need for home podiatrist at this time.     Neck Pain, Bilateral Posterior    C/o stiff, achiness on either side of her neck for about two weeks. Moves head side to side slowly. Has been sleeping on new pillow. Denied numbness, tingling or radiation.  Using aspercreme as needed but not often.     Loss of Appetite    Appetite remains good on mirtazapine. Weight up two lbs since last visit in Feb.     Seizures    Saw Dr. Becky Sax for follow-up 10/10/21.  To remain on Briviact for now since helping.  Agreed with magnesium but patient wants it prn.  To see him again in 6 months. Right eye twitching persists but acceptable.  No seizures.     Situational Depression    Reported having a nice time in [place].  Was there 3 weeks and returned about 2 weeks ago.  Mood much improved; more upbeat.  Still groggy in the morning but tolerable.  Walking with dtr in neighborhood and enjoying her painting.         Review of Systems   Constitutional:  Positive for activity change (Walking in neighborhood with dtr) and appetite change (Improved).   Respiratory:  Negative for cough and shortness of breath.    Cardiovascular:  Negative for chest pain and leg swelling.   Gastrointestinal:  Negative for constipation, diarrhea, nausea and vomiting.   Musculoskeletal:  Positive for gait problem, neck pain and neck stiffness.   Skin:         Toenails improving   Neurological:  Negative for dizziness and seizures.        Rt eye twitching persists   Psychiatric/Behavioral:  Negative for dysphoric mood  and sleep disturbance. The patient is not nervous/anxious.        Past Medical History:   Diagnosis Date    Bleeding in head following injury with loss of consciousness     a year ago    Convulsions     Hyperlipidemia     Hypertension     Hypothyroidism     Irregular heartbeat      Past Surgical History:   Procedure Laterality Date    AORTIC VALVE REPLACEMENT      first AVR required sternotomy, >10 years ago per patient    BACK SURGERY      HYSTERECTOMY      REPLACEMENT TOTAL KNEE Bilateral     left total, right "half knee" replacement    TRANSCATHETER AORTIC VALVE REPLACEMENT  2016    5-6 years ago per patient    WRIST SURGERY Left 2017     Family History   Problem Relation Age of Onset    Stroke Mother     Anuerysm Mother     Cancer Father     Heart disease Sister     Heart disease Brother  Intracerebral hemorrhage Neg Hx      Social History     Tobacco Use    Smoking status: Never    Smokeless tobacco: Never   Vaping Use    Vaping status: Never Used   Substance Use Topics    Alcohol use: Not Currently     Alcohol/week: 3.0 standard drinks of alcohol     Types: 3 Glasses of wine per week     Comment: 1-2 a week    Drug use: Never     Allergies   Allergen Reactions    Codeine Nausea And Vomiting       MEDICATIONS:     Current Outpatient Medications:     acetaminophen (TYLENOL) 650 MG CR tablet, Take 1 tablet (650 mg) by mouth every 8 (eight) hours as needed for Pain, Disp: 30 tablet, Rfl: 0    amLODIPine (NORVASC) 5 MG tablet, Take 5 mg by mouth nightly, Disp: , Rfl:     brivaracetam (BRIVIACT) 100 MG Tab tablet, Take 1 tablet (100 mg) by mouth nightly, Disp: 90 tablet, Rfl: 1    Brivaracetam (BRIVIACT) 50 MG Tab tablet, Take 1 tablet (50 mg) by mouth every morning, Disp: 90 tablet, Rfl: 1    Fungoid Tincture 2 % Solution, APPLY TWICE A DAY TO TOE NAIL, Disp: , Rfl:     levothyroxine (SYNTHROID) 88 MCG tablet, Take 1 tablet (88 mcg total) by mouth every morning, Disp: 90 tablet, Rfl: 3    lisinopril  (ZESTRIL) 10 MG tablet, TAKE 1 TABLET ORALLY ONCE A DAY EVERY MORNING FOR 30 DAY(S), Disp: 90 tablet, Rfl: 2    Magnesium 400 MG Tab, Take 1 tablet (400 mg) by mouth daily as needed (spasms), Disp: 30 tablet, Rfl: 3    mirtazapine (REMERON) 7.5 MG tablet, Take 1 tablet (7.5 mg) by mouth nightly, Disp: 30 tablet, Rfl: 2    Multiple Vitamins-Minerals (Centrum Silver 50+Women) Tab, Take 1 tablet by mouth daily, Disp: 100 tablet, Rfl: 3    Psyllium (Metamucil MultiHealth Fiber) 63 % Powder, Take 1 scoop. by mouth daily, Disp: 660 g, Rfl: 3    rosuvastatin (CRESTOR) 20 MG tablet, Take 1 tablet (20 mg total) by mouth nightly, Disp: 90 tablet, Rfl: 3    urea (CARMOL) 40 % cream, APPLY 1 GRAM ONCE A DAY TO NAILS AND CALLUSES, Disp: , Rfl:     PHYSICAL EXAM:   BP 118/78   Pulse 73   Temp (!) 96 F (35.6 C)   Resp 18   Wt 64.9 kg (143 lb) Comment: Recent per dtr  SpO2 99%   BMI 25.33 kg/m    Wt Readings from Last 1 Encounters:   10/29/21 64.9 kg (143 lb)      Ht Readings from Last 1 Encounters:   10/10/21 1.6 m (5\' 3" )     Physical Exam  Constitutional:       General: She is not in acute distress.     Appearance: She is not ill-appearing or toxic-appearing.   Neck:      Comments: Moves head with shoulders when turning.  ROM good but guarded.  No crepitus.  Mild TTP SCM area bil.  Cardiovascular:      Rate and Rhythm: Normal rate and regular rhythm.      Heart sounds: Normal heart sounds. No murmur heard.  Pulmonary:      Effort: Pulmonary effort is normal. No respiratory distress.      Breath sounds: Normal breath sounds.  Abdominal:      General: Bowel sounds are normal. There is no distension.      Palpations: Abdomen is soft.      Tenderness: There is no abdominal tenderness.   Musculoskeletal:      Cervical back: Tenderness present.      Right lower leg: No edema.      Left lower leg: No edema.   Skin:     Comments: Onychomycosis toenails improved.   Neurological:      Mental Status: She is alert and oriented to  person, place, and time.      Gait: Gait abnormal (Uses walker).      Comments: Rt eye twitching peridocially   Psychiatric:         Mood and Affect: Mood and affect normal.       DIAGNOSTICS:     Lab Results   Component Value Date    WBC 7.50 03/11/2021    HGB 11.2 (L) 03/11/2021    HCT 32.3 (L) 03/11/2021    PLT 186 03/11/2021    CHOL 121 03/10/2021    TRIG 96 03/10/2021    HDL 35 (L) 03/10/2021    LDL 67 03/10/2021    ALT 21 03/09/2021    AST 29 03/09/2021    NA 135 (L) 03/21/2021    K 4.0 03/21/2021    CL 101 03/21/2021    CREAT 0.8 03/21/2021    BUN 15.0 03/21/2021    CO2 29 03/21/2021    TSH 1.08 03/11/2021    INR 1.0 03/10/2021    GLU 110 (H) 03/21/2021    HGBA1C 5.6 03/10/2021     No results found.    ASSESSMENT and PLAN:   Neck pain, bilateral posterior  A/P: Cervical spine x-ray ordered.  Once results obtained, will proceed accordingly with referral and/or PT/OT.  Encouraged using Aspercreme prn and added acetaminophen ER 650 one tab by mouth every 8 hours as needed.    Situational depression  A/P: Continue mirtazapine.    Loss of appetite  A/P: Continue mirtazapine.    Seizures  A/P: Magnesium 400 mg one tab by mouth as needed.    Onychomycosis  A/P: Continue Fungoid Tincture 2% twice a day.    Medications Ordered This Encounter         Disp Refills Start End    Magnesium 400 MG Tab 30 tablet 3 10/29/2021     Take 1 tablet (400 mg) by mouth daily as needed (spasms) - Oral    acetaminophen (TYLENOL) 650 MG CR tablet 30 tablet 0 10/29/2021     Take 1 tablet (650 mg) by mouth every 8 (eight) hours as needed for Pain - Oral          Orders Placed This Encounter   Procedures    X-ray cervical spine 2 Or 3 views     Standing Status:   Future     Number of Occurrences:   1     Standing Expiration Date:   10/30/2022     Scheduling Instructions:      To schedule your procedure please call your chosen Facility's Central Scheduling Number:      [facility] Scheduling [phone]      [facility]  Scheduling [phone]     Order Specific Question:   Reason for Exam:     Answer:   Neck pain     Order Specific Question:   Release to patient  Answer:   Immediate     [name] was seen today for follow-up, neck pain, depression, seizures, anorexia and nail problem.    Diagnoses and all orders for this visit:    Neck pain on right side  -     X-ray cervical spine 2 Or 3 views; Future    Neck pain, bilateral posterior    Situational depression    Loss of appetite    Seizures    Onychomycosis    Other orders  -     Magnesium 400 MG Tab; Take 1 tablet (400 mg) by mouth daily as needed (spasms)  -     acetaminophen (TYLENOL) 650 MG CR tablet; Take 1 tablet (650 mg) by mouth every 8 (eight) hours as needed for Pain        Electronically Signed by Zetta Bills, NP

## 2021-10-29 NOTE — Assessment & Plan Note (Addendum)
A/P: Cervical spine x-ray ordered.  Once results obtained, will proceed accordingly with referral and/or PT/OT.  Encouraged using Aspercreme prn and added acetaminophen ER 650 one tab by mouth every 8 hours as needed.

## 2021-10-29 NOTE — Assessment & Plan Note (Signed)
A/P: Magnesium 400 mg one tab by mouth as needed.

## 2021-10-29 NOTE — Assessment & Plan Note (Addendum)
A/P:  Continue mirtazapine.

## 2021-10-30 ENCOUNTER — Telehealth (INDEPENDENT_AMBULATORY_CARE_PROVIDER_SITE_OTHER): Payer: Self-pay | Admitting: Nurse Practitioner

## 2021-10-30 ENCOUNTER — Encounter (INDEPENDENT_AMBULATORY_CARE_PROVIDER_SITE_OTHER): Payer: Self-pay | Admitting: Nurse Practitioner

## 2021-10-30 DIAGNOSIS — R2681 Unsteadiness on feet: Secondary | ICD-10-CM

## 2021-10-30 NOTE — Telephone Encounter (Signed)
No further w/u needed for carotid arteries per Dr. Franchot Erichsen.  Approved PT/OT - will order.

## 2021-10-30 NOTE — Addendum Note (Signed)
Addended by: Olena Leatherwood on: 10/30/2021 08:36 PM     Modules accepted: Orders

## 2021-10-30 NOTE — Telephone Encounter (Signed)
Spoke with dtr re cervical neck x-ray.  Neg for fx and positive for advanced degenerative changes.  Also showed bil carotid artery atherosclerotic calcification.  Advised that I notified Dr. Willis Modena and will also notify Dr. Franchot Erichsen, pt's cardiologist.  Pt has an echo sched 4/24 but no appt with Dr. Franchot Erichsen yet.  Epic chat sent to Dr. Franchot Erichsen re x-ray results and any additional w/u he would like.  Would also like clearance to order PT/OT.

## 2021-10-31 ENCOUNTER — Telehealth (INDEPENDENT_AMBULATORY_CARE_PROVIDER_SITE_OTHER): Payer: Self-pay | Admitting: Nurse Practitioner

## 2021-10-31 NOTE — Telephone Encounter (Signed)
error 

## 2021-10-31 NOTE — Telephone Encounter (Signed)
PT/OT referral faxed to [facility].   Will follow up to confirm receipt

## 2021-10-31 NOTE — Telephone Encounter (Signed)
-----   Message from Jane B Bartlett, NP sent at 10/30/2021  8:36 PM EDT -----  Orders entered for PT/OT.

## 2021-11-03 ENCOUNTER — Telehealth (INDEPENDENT_AMBULATORY_CARE_PROVIDER_SITE_OTHER): Payer: Self-pay | Admitting: Nurse Practitioner

## 2021-11-03 ENCOUNTER — Other Ambulatory Visit (INDEPENDENT_AMBULATORY_CARE_PROVIDER_SITE_OTHER): Payer: Self-pay | Admitting: Nurse Practitioner

## 2021-11-03 DIAGNOSIS — F32A Depression, unspecified: Secondary | ICD-10-CM

## 2021-11-03 NOTE — Telephone Encounter (Signed)
Refill request came through Fax. Please review

## 2021-11-03 NOTE — Telephone Encounter (Signed)
-----   Message from Zetta Bills, NP sent at 10/30/2021  8:36 PM EDT -----  Orders entered for PT/OT.

## 2021-11-03 NOTE — Telephone Encounter (Signed)
This lpn called [facility] spk with Denny Peon who confirmed order received; start of care for pt is 4/18 with Trinna Post

## 2021-11-03 NOTE — Telephone Encounter (Signed)
error 

## 2021-11-04 MED ORDER — MIRTAZAPINE 7.5 MG PO TABS
7.5000 mg | ORAL_TABLET | Freq: Every evening | ORAL | 2 refills | Status: DC
Start: 2021-11-04 — End: 2021-11-25

## 2021-11-05 ENCOUNTER — Ambulatory Visit: Payer: Medicare Other

## 2021-11-10 ENCOUNTER — Other Ambulatory Visit (INDEPENDENT_AMBULATORY_CARE_PROVIDER_SITE_OTHER): Payer: Medicare Other

## 2021-11-11 ENCOUNTER — Encounter (INDEPENDENT_AMBULATORY_CARE_PROVIDER_SITE_OTHER): Payer: Self-pay | Admitting: Nurse Practitioner

## 2021-11-12 ENCOUNTER — Encounter (INDEPENDENT_AMBULATORY_CARE_PROVIDER_SITE_OTHER): Payer: Self-pay | Admitting: Nurse Practitioner

## 2021-11-21 ENCOUNTER — Ambulatory Visit (INDEPENDENT_AMBULATORY_CARE_PROVIDER_SITE_OTHER): Payer: Medicare Other

## 2021-11-21 DIAGNOSIS — Z953 Presence of xenogenic heart valve: Secondary | ICD-10-CM

## 2021-11-24 ENCOUNTER — Encounter (INDEPENDENT_AMBULATORY_CARE_PROVIDER_SITE_OTHER): Payer: Self-pay

## 2021-11-24 LAB — ECHOCARDIOGRAM ADULT COMPLETE W CLR/ DOPP WAVEFORM
AV Mean Gradient: 9
AV Peak Velocity: 207
IVS Diastolic Thickness (2D): 1.4
LA Dimension (2D): 4.7
LA Volume Index (BP A-L): 0.07
LVID diastole (2D): 3.3
LVID systole (2D): 2.2
MV Area (PHT): 3.174
MV E/A: 0.673
MV E/A: 0.7
MV Mean Gradient: 5
MV Mean Gradient: 6
Prox Ascending Aorta Diameter: 3.3
Pulmonary Valve Findings: NORMAL
RV Basal Diastolic Dimension: 2.8
RV Function: NORMAL
Site RV Size (AS): NORMAL
TAPSE: 2.11
Tricuspid Valve Findings: NORMAL

## 2021-11-25 ENCOUNTER — Ambulatory Visit: Payer: Medicare Other | Admitting: Nurse Practitioner

## 2021-11-25 ENCOUNTER — Encounter (INDEPENDENT_AMBULATORY_CARE_PROVIDER_SITE_OTHER): Payer: Self-pay | Admitting: Nurse Practitioner

## 2021-11-25 DIAGNOSIS — F4321 Adjustment disorder with depressed mood: Secondary | ICD-10-CM

## 2021-11-25 DIAGNOSIS — R55 Syncope and collapse: Secondary | ICD-10-CM | POA: Insufficient documentation

## 2021-11-25 DIAGNOSIS — M542 Cervicalgia: Secondary | ICD-10-CM

## 2021-11-25 DIAGNOSIS — R569 Unspecified convulsions: Secondary | ICD-10-CM

## 2021-11-25 MED ORDER — MIRTAZAPINE 15 MG PO TABS
15.0000 mg | ORAL_TABLET | Freq: Every evening | ORAL | 2 refills | Status: DC
Start: 2021-11-25 — End: 2022-01-22

## 2021-11-25 NOTE — Assessment & Plan Note (Signed)
A/P: Increase mirtazapine to 15 mg by mouth each evening.

## 2021-11-25 NOTE — Assessment & Plan Note (Addendum)
A/P: TENS treatment per PT recommendation.  Continue acetaminophen and Aspercreme as needed.

## 2021-11-25 NOTE — Progress Notes (Signed)
Date: 11/25/2021    Patient Name: [name]    Patient was seen in their home (POS 12) in lieu of an office visit for the following reason:   Requires use of an assistive device in order to ambulate due to gait instability.        Code Status: DNAR    HPI:   [name] is a 88 y.o. F that I am here to address the following:    Problem   Syncope    Patient had f/u echocardiogram 11/21/21 s/p syncopal episode August 2022.  Echo unchanged from prior study. EF 55-60%. No changes in current treatment.       Neck Pain, Bilateral Posterior    Continues to c/o stiffness and achiness on either side of her neck.  Has been working with PT who recommended TENS unit, twice a day for 15".  Has not needed Aspercreme or acetaminophen.  Does feel it is getting a little better.  Denied numbness, tingling or radiation.        Seizures    Has not had any right eye twitching since our last visit.  Remains on Briviact.  Takes magnesium prn.  No seizures.         Situational Depression    Daughter feels mother's mood has been more labile and emotions more tense and strained.  Discussed with patient and suggested a trial of increased mirtazpine.  She agreed.           Review of Systems   Constitutional:  Negative for chills and fever.   Respiratory:  Negative for shortness of breath.    Cardiovascular:  Negative for chest pain.   Musculoskeletal:  Positive for neck pain.   Neurological:  Negative for dizziness, tremors, seizures, syncope and numbness.   Psychiatric/Behavioral:  Negative for sleep disturbance.        Past Medical History:   Diagnosis Date    Bleeding in head following injury with loss of consciousness     a year ago    Convulsions     Hyperlipidemia     Hypertension     Hypothyroidism     Irregular heartbeat      Past Surgical History:   Procedure Laterality Date    AORTIC VALVE REPLACEMENT      first AVR required sternotomy, >10 years ago per patient    BACK SURGERY      HYSTERECTOMY      REPLACEMENT  TOTAL KNEE Bilateral     left total, right "half knee" replacement    TRANSCATHETER AORTIC VALVE REPLACEMENT  2016    5-6 years ago per patient    WRIST SURGERY Left 2017     Family History   Problem Relation Age of Onset    Stroke Mother     Anuerysm Mother     Cancer Father     Heart disease Sister     Heart disease Brother     Intracerebral hemorrhage Neg Hx      Social History     Tobacco Use    Smoking status: Never    Smokeless tobacco: Never   Vaping Use    Vaping status: Never Used   Substance Use Topics    Alcohol use: Not Currently     Alcohol/week: 3.0 standard drinks of alcohol     Types: 3 Glasses of wine per week     Comment: 1-2 a week    Drug use: Never  Allergies   Allergen Reactions    Codeine Nausea And Vomiting       MEDICATIONS:     Current Outpatient Medications:     acetaminophen (TYLENOL) 650 MG CR tablet, Take 1 tablet (650 mg) by mouth every 8 (eight) hours as needed for Pain, Disp: 30 tablet, Rfl: 0    amLODIPine (NORVASC) 5 MG tablet, Take 5 mg by mouth nightly, Disp: , Rfl:     brivaracetam (BRIVIACT) 100 MG Tab tablet, Take 1 tablet (100 mg) by mouth nightly, Disp: 90 tablet, Rfl: 1    Brivaracetam (BRIVIACT) 50 MG Tab tablet, Take 1 tablet (50 mg) by mouth every morning, Disp: 90 tablet, Rfl: 1    Fungoid Tincture 2 % Solution, APPLY TWICE A DAY TO TOE NAIL, Disp: , Rfl:     levothyroxine (SYNTHROID) 88 MCG tablet, Take 1 tablet (88 mcg total) by mouth every morning, Disp: 90 tablet, Rfl: 3    lisinopril (ZESTRIL) 10 MG tablet, TAKE 1 TABLET ORALLY ONCE A DAY EVERY MORNING FOR 30 DAY(S), Disp: 90 tablet, Rfl: 2    Magnesium 400 MG Tab, Take 1 tablet (400 mg) by mouth daily as needed (spasms), Disp: 30 tablet, Rfl: 3    mirtazapine (REMERON) 15 MG tablet, Take 1 tablet (15 mg) by mouth nightly, Disp: 30 tablet, Rfl: 2    Multiple Vitamins-Minerals (Centrum Silver 50+Women) Tab, Take 1 tablet by mouth daily, Disp: 100 tablet, Rfl: 3    Psyllium (Metamucil MultiHealth Fiber) 63 %  Powder, Take 1 scoop. by mouth daily, Disp: 660 g, Rfl: 3    rosuvastatin (CRESTOR) 20 MG tablet, Take 1 tablet (20 mg total) by mouth nightly, Disp: 90 tablet, Rfl: 3    urea (CARMOL) 40 % cream, APPLY 1 GRAM ONCE A DAY TO NAILS AND CALLUSES, Disp: , Rfl:     PHYSICAL EXAM:   BP 124/68   Pulse 73   Temp 97.8 F (36.6 C)   Resp 18   SpO2 98%    Wt Readings from Last 1 Encounters:   10/29/21 64.9 kg (143 lb)      Ht Readings from Last 1 Encounters:   10/10/21 1.6 m (5\' 3" )     Physical Exam  Constitutional:       General: She is not in acute distress.     Appearance: She is not ill-appearing or toxic-appearing.   Neck:      Comments: Limited ROM.  Moves shoulders when moving head.  Cardiovascular:      Rate and Rhythm: Normal rate and regular rhythm.      Pulses: Normal pulses.      Heart sounds: Normal heart sounds.   Pulmonary:      Effort: Pulmonary effort is normal. No respiratory distress.      Breath sounds: Normal breath sounds.   Abdominal:      General: Bowel sounds are normal. There is no distension.      Palpations: Abdomen is soft.      Tenderness: There is no abdominal tenderness.   Musculoskeletal:      Right lower leg: No edema.      Left lower leg: No edema.   Neurological:      Mental Status: She is alert and oriented to person, place, and time.      Gait: Gait abnormal.   Psychiatric:         Mood and Affect: Mood and affect normal.       DIAGNOSTICS:  Lab Results   Component Value Date    WBC 7.50 03/11/2021    HGB 11.2 (L) 03/11/2021    HCT 32.3 (L) 03/11/2021    PLT 186 03/11/2021    CHOL 121 03/10/2021    TRIG 96 03/10/2021    HDL 35 (L) 03/10/2021    LDL 67 03/10/2021    ALT 21 03/09/2021    AST 29 03/09/2021    NA 135 (L) 03/21/2021    K 4.0 03/21/2021    CL 101 03/21/2021    CREAT 0.8 03/21/2021    BUN 15.0 03/21/2021    CO2 29 03/21/2021    TSH 1.08 03/11/2021    INR 1.0 03/10/2021    GLU 110 (H) 03/21/2021    HGBA1C 5.6 03/10/2021     X-ray cervical spine 2 Or 3 views    Result  Date: 10/30/2021    No evidence of acute fracture. Advanced degenerative changes Laurena Slimmer, MD 10/30/2021 2:00 AM     ASSESSMENT and PLAN:   Neck pain, bilateral posterior  A/P: TENS treatment per PT recommendation.  Continue acetaminophen and Aspercreme as needed.      Seizures  A/P:  No change Briviact or magnesium.    Situational depression  A/P: Increase mirtazapine to 15 mg by mouth each evening.    Syncope  A/P: Follow-up with Dr. Franchot Erichsen, cardio, as needed.    Medications Ordered This Encounter         Disp Refills Start End    mirtazapine (REMERON) 15 MG tablet 30 tablet 2 11/25/2021 02/23/2022    Take 1 tablet (15 mg) by mouth nightly - Oral          No orders of the defined types were placed in this encounter.    Tariya was seen today for follow-up, neck pain, depression, seizures and syncope.    Diagnoses and all orders for this visit:    Neck pain, bilateral posterior    Seizures    Situational depression    Syncope, unspecified syncope type    Other orders  -     mirtazapine (REMERON) 15 MG tablet; Take 1 tablet (15 mg) by mouth nightly        Electronically Signed by Zetta Bills, NP

## 2021-11-25 NOTE — Assessment & Plan Note (Signed)
A/P:  No change Briviact or magnesium.

## 2021-11-25 NOTE — Assessment & Plan Note (Signed)
A/P: Follow-up with Dr. Franchot Erichsen, cardio, as needed.

## 2021-11-26 ENCOUNTER — Encounter (INDEPENDENT_AMBULATORY_CARE_PROVIDER_SITE_OTHER): Payer: Medicare Other | Admitting: Nurse Practitioner

## 2021-11-26 NOTE — Progress Notes (Signed)
I have participated in the development of and reviewed the home health plan of care.

## 2021-12-05 ENCOUNTER — Encounter (INDEPENDENT_AMBULATORY_CARE_PROVIDER_SITE_OTHER): Payer: Self-pay | Admitting: Nurse Practitioner

## 2021-12-31 ENCOUNTER — Encounter (INDEPENDENT_AMBULATORY_CARE_PROVIDER_SITE_OTHER): Payer: Self-pay | Admitting: Nurse Practitioner

## 2022-01-05 ENCOUNTER — Other Ambulatory Visit: Payer: Self-pay | Admitting: Nurse Practitioner

## 2022-01-05 DIAGNOSIS — R569 Unspecified convulsions: Secondary | ICD-10-CM

## 2022-01-14 ENCOUNTER — Encounter (INDEPENDENT_AMBULATORY_CARE_PROVIDER_SITE_OTHER): Payer: Self-pay | Admitting: Nurse Practitioner

## 2022-01-21 ENCOUNTER — Telehealth (INDEPENDENT_AMBULATORY_CARE_PROVIDER_SITE_OTHER): Payer: Self-pay | Admitting: Nurse Practitioner

## 2022-01-21 NOTE — Telephone Encounter (Signed)
Daughter called.  Said she had made a request for an earlier appointment with PCP this week instead of 7/14?    Patient has been dizzy for a few days.  Yesterday it improved.    Appetite is healthy and has good fluid intake.  Using electrolyte drinks.    Has taken her BP laying down and sitting up.  There were not "crazy" fluctuations.      Had three falls - including one resulting with a bump on her back.    Seems like things are better as of yesterday.    Daughter plans to take patient to get LABS done, possibly today.  Does not want mobile phlebotomy, will call [phone] to find a lab [place] near her.    Said she was in touch with PCP at some point about the patient's recent falls.    Interested in an earlier appointment if one opens up.

## 2022-01-22 ENCOUNTER — Ambulatory Visit
Admission: RE | Admit: 2022-01-22 | Discharge: 2022-01-22 | Disposition: A | Discharge status: Home / Self Care | Payer: Medicare Other | Source: Ambulatory Visit | Attending: Nurse Practitioner | Admitting: Nurse Practitioner

## 2022-01-22 ENCOUNTER — Other Ambulatory Visit (INDEPENDENT_AMBULATORY_CARE_PROVIDER_SITE_OTHER): Payer: Self-pay | Admitting: Nurse Practitioner

## 2022-01-22 DIAGNOSIS — E038 Other specified hypothyroidism: Secondary | ICD-10-CM | POA: Insufficient documentation

## 2022-01-22 DIAGNOSIS — R42 Dizziness and giddiness: Secondary | ICD-10-CM | POA: Insufficient documentation

## 2022-01-22 LAB — COMPREHENSIVE METABOLIC PANEL
ALT: 21 U/L (ref 0–55)
AST (SGOT): 36 U/L (ref 5–41)
Albumin/Globulin Ratio: 1.2 (ref 0.9–2.2)
Albumin: 3.6 g/dL (ref 3.5–5.0)
Alkaline Phosphatase: 66 U/L (ref 37–117)
Anion Gap: 8 (ref 5.0–15.0)
BUN: 18 mg/dL (ref 7.0–21.0)
Bilirubin, Total: 0.4 mg/dL (ref 0.2–1.2)
CO2: 24 mEq/L (ref 17–29)
Calcium: 8.5 mg/dL (ref 7.9–10.2)
Chloride: 96 mEq/L — ABNORMAL LOW (ref 99–111)
Creatinine: 0.8 mg/dL (ref 0.4–1.0)
Globulin: 2.9 g/dL (ref 2.0–3.6)
Glucose: 97 mg/dL (ref 70–100)
Protein, Total: 6.5 g/dL (ref 6.0–8.3)
Sodium: 128 mEq/L — ABNORMAL LOW (ref 135–145)
eGFR: >60 mL/min/{1.73_m2} (ref >=60)

## 2022-01-22 LAB — CBC AND DIFFERENTIAL
Absolute NRBC: 0 10*3/uL (ref 0.00–0.00)
Basophils Absolute Automated: 0.02 10*3/uL (ref 0.00–0.08)
Basophils Automated: 0.4 %
Eosinophils Absolute Automated: 0.06 10*3/uL (ref 0.00–0.44)
Eosinophils Automated: 1.1 %
Hematocrit: 36.1 % (ref 34.7–43.7)
Hgb: 11.9 g/dL (ref 11.4–14.8)
Immature Granulocytes Absolute: 0.01 10*3/uL (ref 0.00–0.07)
Immature Granulocytes: 0.2 %
Instrument Absolute Neutrophil Count: 4.22 10*3/uL (ref 1.10–6.33)
Lymphocytes Absolute Automated: 0.89 10*3/uL (ref 0.42–3.22)
Lymphocytes Automated: 15.7 %
MCH: 31.8 pg (ref 25.1–33.5)
MCHC: 33 g/dL (ref 31.5–35.8)
MCV: 96.5 fL — ABNORMAL HIGH (ref 78.0–96.0)
MPV: 9.8 fL (ref 8.9–12.5)
Monocytes Absolute Automated: 0.46 10*3/uL (ref 0.21–0.85)
Monocytes: 8.1 %
Neutrophils Absolute: 4.22 10*3/uL (ref 1.10–6.33)
Neutrophils: 74.5 %
Nucleated RBC: 0 /100 WBC (ref 0.0–0.0)
Platelets: 223 10*3/uL (ref 142–346)
RBC: 3.74 10*6/uL — ABNORMAL LOW (ref 3.90–5.10)
RDW: 13 % (ref 11–15)
WBC: 5.66 10*3/uL (ref 3.10–9.50)

## 2022-01-22 LAB — HEMOLYSIS INDEX: Hemolysis Index: 53 Index — ABNORMAL HIGH (ref 0–24)

## 2022-01-22 LAB — MAGNESIUM: Magnesium: 2.1 mg/dL (ref 1.6–2.6)

## 2022-01-22 LAB — TSH: TSH: 1.17 u[IU]/mL (ref 0.35–4.94)

## 2022-01-22 MED ORDER — MIRTAZAPINE 15 MG PO TABS
15.0000 mg | ORAL_TABLET | Freq: Every evening | ORAL | 2 refills | Status: DC
Start: 2022-01-22 — End: 2022-02-27

## 2022-01-22 NOTE — Telephone Encounter (Signed)
Refill request for mirtazapine (REMERON) 15 MG tablet came through fax.     Please review

## 2022-01-23 ENCOUNTER — Telehealth (INDEPENDENT_AMBULATORY_CARE_PROVIDER_SITE_OTHER): Payer: Self-pay | Admitting: Internal Medicine

## 2022-01-23 NOTE — Telephone Encounter (Signed)
See Mychart message.  Patient to start Gatorade

## 2022-01-23 NOTE — Telephone Encounter (Signed)
Called family about labs. NO answer and message left.   Sodium slightly low and suggested that patient drink gatoraid over the weekend rather than water.  Asked daughter to call office to let us know that she got the message

## 2022-01-25 IMAGING — CR FOOT LT 3 VWS MIN
1 series · 3 of 3 positions shown · non-contrast
Comparison: 3 views left foot

HISTORY: 89 year-old female with left toe pain .
TECHNIQUE: 3 weightbearing views of the left foot.

[Series 5: llo · left · 0.15mm/px · 3 of 3 slices shown]
[im 1/3]
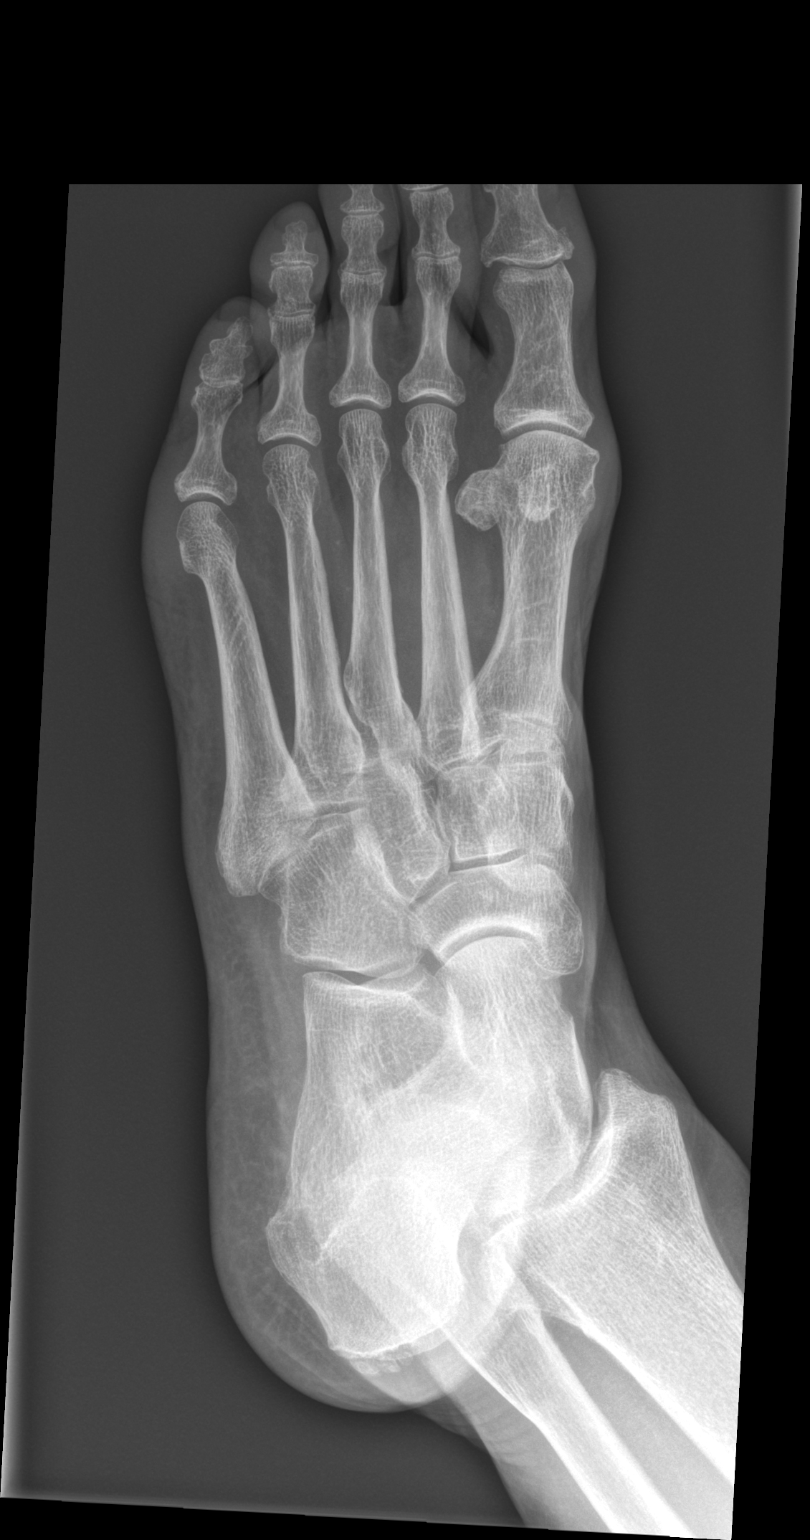
[im 2/3]
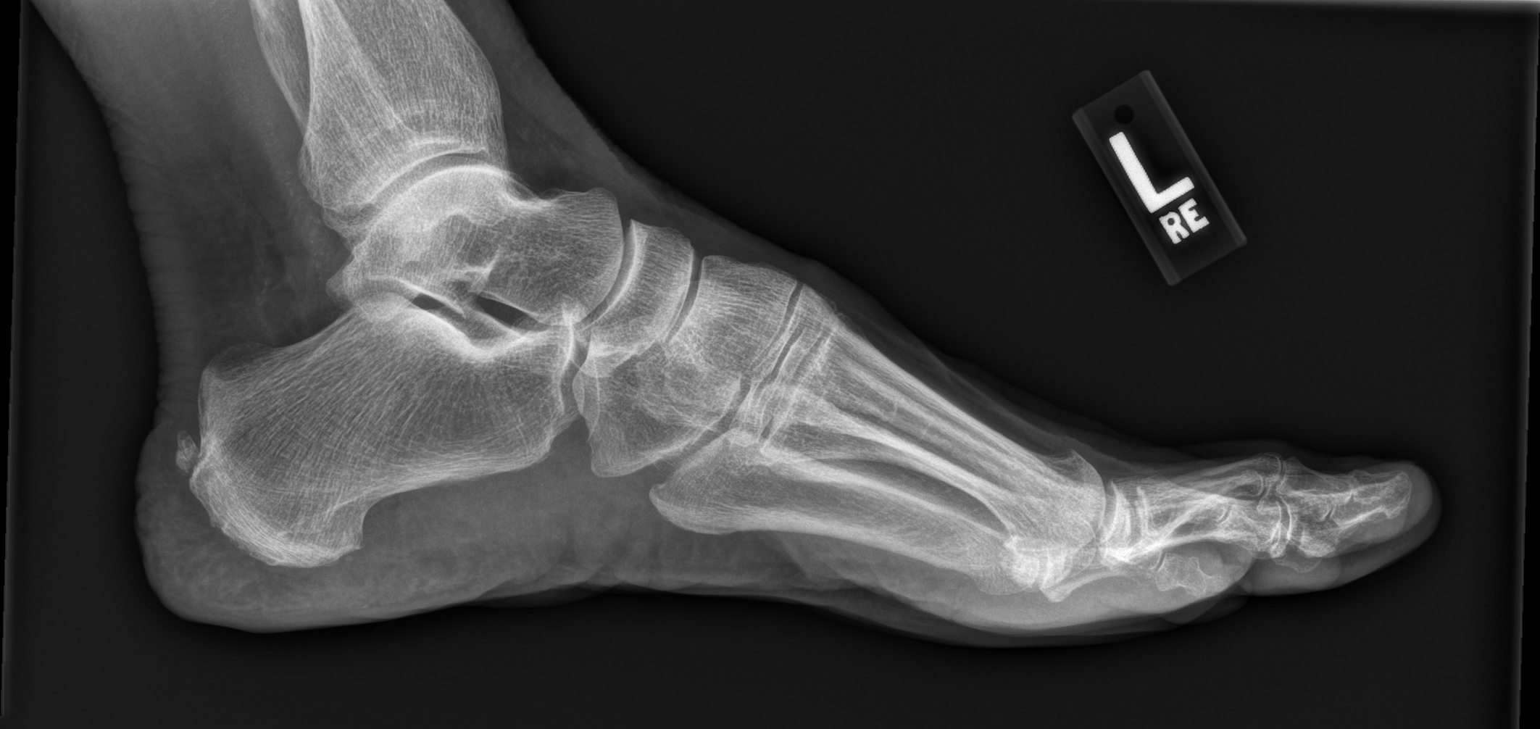
[im 3/3]
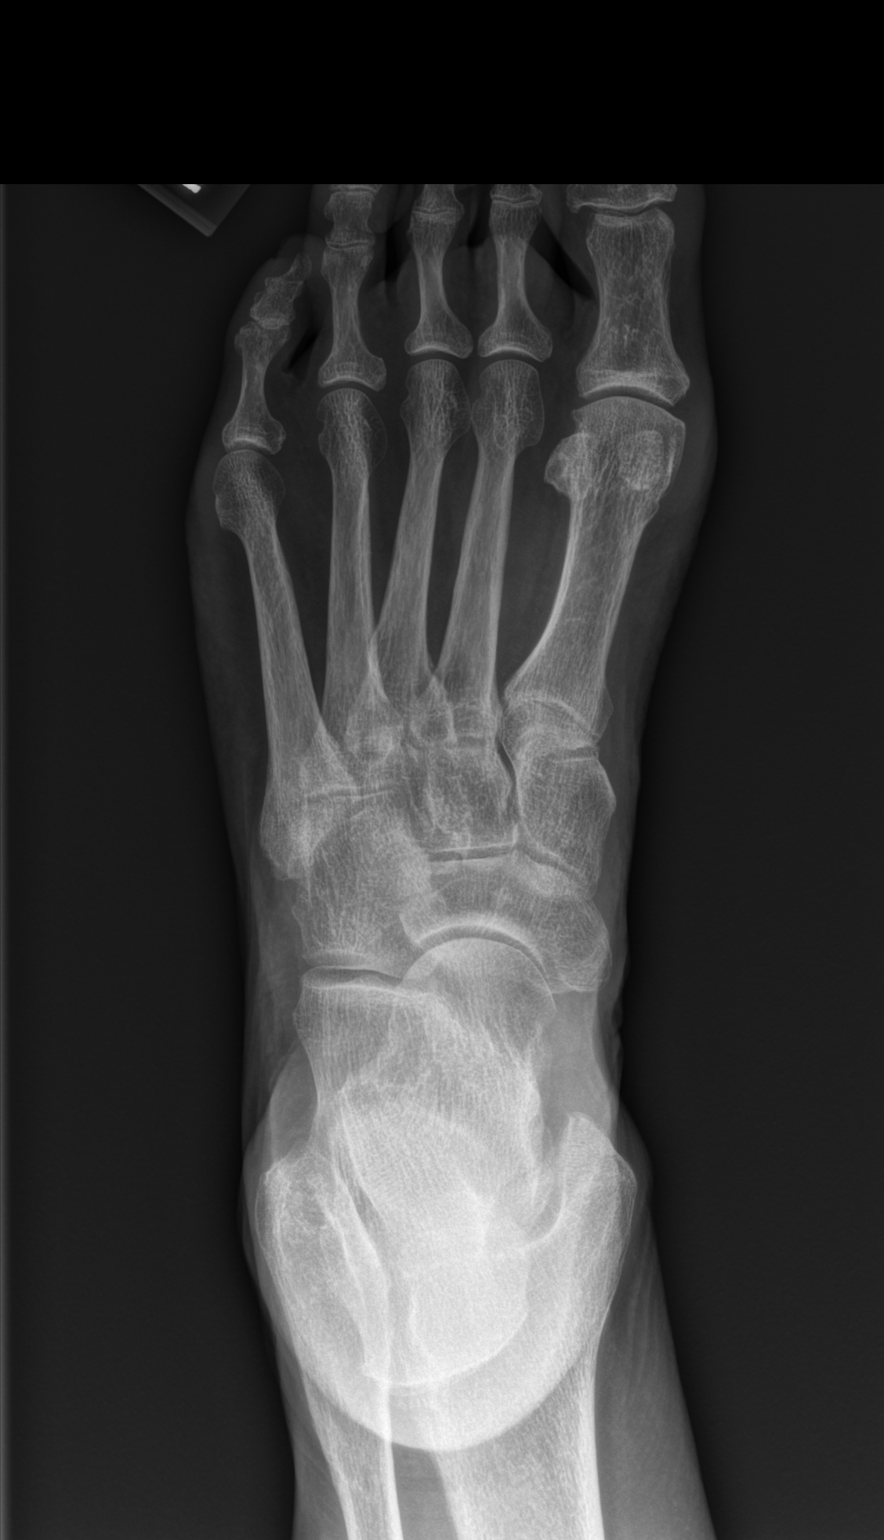

[3 of 3 positions shown; findings below may reference images not displayed]

FINDINGS: Bones: No fractures seen, no osseous lesions are present. There is a calcaneal bony spur at the Achilles tendon insertion site.

Joints: The osseous alignment is unremarkable. No significant joint effusion is present. There are no significant degenerative changes.

Soft tissues: The soft tissues are unremarkable. No radiopaque foreign bodies are present.
IMPRESSION: 1. No acute finding.

2. There is no significant arthritis.

3. There is a small calcaneal bony spur at the Achilles tendon insertion site.

4. Consider MRI evaluation patient has persistent pain.

## 2022-01-29 ENCOUNTER — Telehealth (INDEPENDENT_AMBULATORY_CARE_PROVIDER_SITE_OTHER): Payer: Self-pay | Admitting: Nurse Practitioner

## 2022-01-29 ENCOUNTER — Encounter (INDEPENDENT_AMBULATORY_CARE_PROVIDER_SITE_OTHER): Payer: Self-pay | Admitting: Nurse Practitioner

## 2022-01-29 ENCOUNTER — Ambulatory Visit
Admission: RE | Admit: 2022-01-29 | Discharge: 2022-01-29 | Disposition: A | Discharge status: Home / Self Care | Payer: Medicare Other | Source: Ambulatory Visit | Attending: Internal Medicine | Admitting: Internal Medicine

## 2022-01-29 DIAGNOSIS — E875 Hyperkalemia: Secondary | ICD-10-CM

## 2022-01-29 DIAGNOSIS — E871 Hypo-osmolality and hyponatremia: Secondary | ICD-10-CM

## 2022-01-29 LAB — CBC
Absolute NRBC: 0 10*3/uL (ref 0.00–0.00)
Hematocrit: 39 % (ref 34.7–43.7)
Hgb: 13 g/dL (ref 11.4–14.8)
MCH: 32.3 pg (ref 25.1–33.5)
MCHC: 33.3 g/dL (ref 31.5–35.8)
MCV: 97 fL — ABNORMAL HIGH (ref 78.0–96.0)
MPV: 9.6 fL (ref 8.9–12.5)
Nucleated RBC: 0 /100 WBC (ref 0.0–0.0)
Platelets: 243 10*3/uL (ref 142–346)
RBC: 4.02 10*6/uL (ref 3.90–5.10)
RDW: 14 % (ref 11–15)
WBC: 5.72 10*3/uL (ref 3.10–9.50)

## 2022-01-29 LAB — BASIC METABOLIC PANEL
Anion Gap: 8 (ref 5.0–15.0)
BUN: 23 mg/dL — ABNORMAL HIGH (ref 7.0–21.0)
CO2: 27 mEq/L (ref 17–29)
Calcium: 9.1 mg/dL (ref 7.9–10.2)
Chloride: 101 mEq/L (ref 99–111)
Creatinine: 0.9 mg/dL (ref 0.4–1.0)
Glucose: 90 mg/dL (ref 70–100)
Potassium: 4.5 mEq/L (ref 3.5–5.3)
Sodium: 136 mEq/L (ref 135–145)
eGFR: 59.8 mL/min/{1.73_m2} — AB (ref >=60)

## 2022-01-29 LAB — HEMOLYSIS INDEX: Hemolysis Index: 6 Index (ref 0–24)

## 2022-01-29 NOTE — Telephone Encounter (Signed)
Order entered for BMP.   Dtr to take mother this morning.

## 2022-01-29 NOTE — Telephone Encounter (Signed)
Daughter plans to have labs drawn for mother at an [place] location today.    This is to recheck abnormal levels after last week in preparation for appt with PCP tomorrow.    Please enter the lab orders needed so patient can have them drawn correctly for PCP.    Call Daughter Sherilyn Banker [phone] when orders are in record.  She has to get to lab before 3 pm.

## 2022-01-29 NOTE — Telephone Encounter (Signed)
Patient informed ,appreciated the care and has no questions at the moment .

## 2022-01-30 ENCOUNTER — Ambulatory Visit: Payer: Medicare Other | Admitting: Nurse Practitioner

## 2022-01-30 ENCOUNTER — Telehealth (INDEPENDENT_AMBULATORY_CARE_PROVIDER_SITE_OTHER): Payer: Self-pay | Admitting: Nurse Practitioner

## 2022-01-30 DIAGNOSIS — W19XXXD Unspecified fall, subsequent encounter: Secondary | ICD-10-CM

## 2022-01-30 DIAGNOSIS — R197 Diarrhea, unspecified: Secondary | ICD-10-CM

## 2022-01-30 DIAGNOSIS — E871 Hypo-osmolality and hyponatremia: Secondary | ICD-10-CM

## 2022-01-30 DIAGNOSIS — R569 Unspecified convulsions: Secondary | ICD-10-CM

## 2022-01-30 NOTE — Telephone Encounter (Signed)
-----   Message from Buford Dresser, MD sent at 01/30/2022  4:00 PM EDT -----  Please inform labs normal

## 2022-01-30 NOTE — Telephone Encounter (Signed)
This lpn called pt dtr [name] no answr LVM advs of PCP messg "Please inform labs normal"

## 2022-02-01 ENCOUNTER — Encounter (INDEPENDENT_AMBULATORY_CARE_PROVIDER_SITE_OTHER): Payer: Self-pay | Admitting: Nurse Practitioner

## 2022-02-01 DIAGNOSIS — W19XXXA Unspecified fall, initial encounter: Secondary | ICD-10-CM | POA: Insufficient documentation

## 2022-02-01 DIAGNOSIS — R197 Diarrhea, unspecified: Secondary | ICD-10-CM | POA: Insufficient documentation

## 2022-02-01 MED ORDER — METAMUCIL MULTIHEALTH FIBER 63 % PO POWD
1.0000 | Freq: Every day | ORAL | 3 refills | Status: DC | PRN
Start: 2022-02-01 — End: 2023-01-29

## 2022-02-01 NOTE — Assessment & Plan Note (Signed)
A/P: F/u with Dr. Becky Sax in September.

## 2022-02-01 NOTE — Assessment & Plan Note (Signed)
A/P: Advised to always use walker and be cognizant of stairs, carpets, etc.  She will be staying her daughter in [place] who will then take her to [place] to visit friends.

## 2022-02-01 NOTE — Assessment & Plan Note (Signed)
A/P:  Reduce Gatoraide to two bottles a day.  If dizziness returns while in [place], advised to get BMP to check sodium.

## 2022-02-01 NOTE — Progress Notes (Signed)
Date: 01/30/2022    [name]    Patient was seen in their home (POS 12) in lieu of an office visit for the following reason:   Requires use of an assistive device in order to ambulate due to gait instability.        Code Status: DNAR    HPI:   [name] is a 88 y.o. F that I am here to address the following.  Daughter, [name], was present and supplemented her mother's information.    Problem   Fall    [place] the beach for [place] and [name] reported her mother had a few falls while they were there.  No injuries or LOC although one fall resulted in a black eye.  Was having some dizziness, no seizures.  No falls since they've been back.  [facility] going to [facility] and will return 03/04/22.       Diarrhea    Had diarrhea yesterday.  None today.  Has been holding metamucil and prune juice.     Seizures    Concerned dizziness could be indicative of her seizures returning but has now resolved.  Daugher said she has not noticed any more right eye twitching.  Remains on Briviact.  Takes magnesium prn.  Magnesium level 01/22/22 was 2.1.       Hyponatremia    Ordered labs to be done upon their return which she did on 01/22/22. Sodium 128.  Has been drinking four small bottles of Gatoraide since.  Repeated labs 01/29/22 - sodium 136.  Dizziness likely 2/2 hyponatremia.  Has now resolved.         Review of Systems   Constitutional:  Negative for chills, fatigue and fever.   Respiratory:  Negative for shortness of breath.    Cardiovascular:  Positive for leg swelling. Negative for chest pain.   Gastrointestinal:  Positive for diarrhea.   Musculoskeletal:  Positive for gait problem.   Skin:         Black eye, left, fading   Neurological:  Positive for weakness. Negative for dizziness, tremors, seizures, syncope and headaches.   Psychiatric/Behavioral:  Negative for sleep disturbance.    All other systems reviewed and are negative.      Past Medical History:   Diagnosis Date     Bleeding in head following injury with loss of consciousness     a year ago    Convulsions     Hyperlipidemia     Hypertension     Hypothyroidism     Irregular heartbeat      Past Surgical History:   Procedure Laterality Date    AORTIC VALVE REPLACEMENT      first AVR required sternotomy, >10 years ago per patient    BACK SURGERY      HYSTERECTOMY      REPLACEMENT TOTAL KNEE Bilateral     left total, right "half knee" replacement    TRANSCATHETER AORTIC VALVE REPLACEMENT  2016    5-6 years ago per patient    WRIST SURGERY Left 2017     Family History   Problem Relation Age of Onset    Stroke Mother     Anuerysm Mother     Cancer Father     Heart disease Sister     Heart disease Brother     Intracerebral hemorrhage Neg Hx      Social History     Tobacco Use    Smoking status:  Never    Smokeless tobacco: Never   Vaping Use    Vaping Use: Never used   Substance Use Topics    Alcohol use: Not Currently     Alcohol/week: 3.0 standard drinks of alcohol     Types: 3 Glasses of wine per week     Comment: 1-2 a week    Drug use: Never     Allergies   Allergen Reactions    Codeine Nausea And Vomiting       MEDICATIONS:     Current Outpatient Medications:     acetaminophen (TYLENOL) 650 MG CR tablet, Take 1 tablet (650 mg) by mouth every 8 (eight) hours as needed for Pain, Disp: 30 tablet, Rfl: 0    amLODIPine (NORVASC) 5 MG tablet, Take 5 mg by mouth nightly, Disp: , Rfl:     Brivaracetam (BRIVIACT) 50 MG Tab tablet, Take 1 tablet (50 mg) by mouth every morning, Disp: 90 tablet, Rfl: 1    Briviact 100 MG Tab tablet, TAKE 1 TABLET BY MOUTH EVERY 12 HOURS, Disp: 180 tablet, Rfl: 1    Fungoid Tincture 2 % Solution, APPLY TWICE A DAY TO TOE NAIL, Disp: , Rfl:     levothyroxine (SYNTHROID) 88 MCG tablet, Take 1 tablet (88 mcg total) by mouth every morning, Disp: 90 tablet, Rfl: 3    lisinopril (ZESTRIL) 10 MG tablet, TAKE 1 TABLET ORALLY ONCE A DAY EVERY MORNING FOR 30 DAY(S), Disp: 90 tablet, Rfl: 2    Magnesium 400 MG  Tab, Take 1 tablet (400 mg) by mouth daily as needed (spasms), Disp: 30 tablet, Rfl: 3    mirtazapine (REMERON) 15 MG tablet, Take 1 tablet (15 mg) by mouth nightly, Disp: 30 tablet, Rfl: 2    Multiple Vitamins-Minerals (Centrum Silver 50+Women) Tab, Take 1 tablet by mouth daily, Disp: 100 tablet, Rfl: 3    Psyllium (Metamucil MultiHealth Fiber) 63 % Powder, Take 1 scoop. by mouth daily as needed (Constipation), Disp: 660 g, Rfl: 3    rosuvastatin (CRESTOR) 20 MG tablet, Take 1 tablet (20 mg total) by mouth nightly, Disp: 90 tablet, Rfl: 3    urea (CARMOL) 40 % cream, APPLY 1 GRAM ONCE A DAY TO NAILS AND CALLUSES, Disp: , Rfl:     PHYSICAL EXAM:   BP 104/60   Pulse 61   Resp 18   SpO2 96%    Wt Readings from Last 1 Encounters:   10/29/21 64.9 kg (143 lb)      Ht Readings from Last 1 Encounters:   10/10/21 1.6 m (5\' 3" )     Physical Exam  Constitutional:       General: She is not in acute distress.     Appearance: She is not ill-appearing or toxic-appearing.   Cardiovascular:      Rate and Rhythm: Normal rate and regular rhythm.      Pulses: Normal pulses.      Heart sounds: Normal heart sounds.   Pulmonary:      Effort: Pulmonary effort is normal.      Breath sounds: Normal breath sounds.   Abdominal:      General: Bowel sounds are normal. There is no distension.      Palpations: Abdomen is soft.      Tenderness: There is no abdominal tenderness.   Musculoskeletal:      Right lower leg: No edema.      Left lower leg: No edema.      Comments: 1-2 +  bil pedal edema   Skin:     Comments: Left black eye fading   Neurological:      Mental Status: She is alert and oriented to person, place, and time.      Motor: Weakness present.      Gait: Gait abnormal.   Psychiatric:         Mood and Affect: Mood and affect normal.       DIAGNOSTICS:     Lab Results   Component Value Date    WBC 5.72 01/29/2022    HGB 13.0 01/29/2022    HCT 39.0 01/29/2022    PLT 243 01/29/2022    CHOL 121 03/10/2021    TRIG 96 03/10/2021    HDL 35  (L) 03/10/2021    LDL 67 03/10/2021    ALT 21 01/22/2022    AST 36 01/22/2022    NA 136 01/29/2022    K 4.5 01/29/2022    CL 101 01/29/2022    CREAT 0.9 01/29/2022    BUN 23.0 (H) 01/29/2022    CO2 27 01/29/2022    TSH 1.17 01/22/2022    INR 1.0 03/10/2021    GLU 90 01/29/2022    HGBA1C 5.6 03/10/2021    ALKPHOS 66 01/22/2022     No results found.    ASSESSMENT and PLAN:   Fall  A/P: Advised to always use walker and be cognizant of stairs, carpets, etc.  She will be staying her daughter in [place] who will then take her to [place] to visit friends.      Hyponatremia  A/P:  Reduce Gatoraide to two bottles a day.  If dizziness returns while in [place], advised to get BMP to check sodium.    Seizures  A/P: F/u with Dr. Becky Sax in September.      Diarrhea  A/P: Change metamucil and prune juice to prn.    Medications Ordered This Encounter         Disp Refills Start End    Psyllium (Metamucil MultiHealth Fiber) 63 % Powder 660 g 3 02/01/2022     Take 1 scoop. by mouth daily as needed (Constipation) - Oral          No orders of the defined types were placed in this encounter.    Tabria was seen today for follow-up, dizziness, fall, hyponatremia, diarrhea and seizures.    Diagnoses and all orders for this visit:    Fall, subsequent encounter    Hyponatremia    Seizures    Diarrhea, unspecified type    Other orders  -     Psyllium (Metamucil MultiHealth Fiber) 63 % Powder; Take 1 scoop. by mouth daily as needed (Constipation)        Electronically Signed by Zetta Bills, NP

## 2022-02-01 NOTE — Assessment & Plan Note (Signed)
A/P: Change metamucil and prune juice to prn.

## 2022-02-04 ENCOUNTER — Encounter (INDEPENDENT_AMBULATORY_CARE_PROVIDER_SITE_OTHER): Payer: Self-pay | Admitting: Nurse Practitioner

## 2022-02-27 ENCOUNTER — Encounter (INDEPENDENT_AMBULATORY_CARE_PROVIDER_SITE_OTHER): Payer: Self-pay | Admitting: Nurse Practitioner

## 2022-02-27 MED ORDER — MIRTAZAPINE 15 MG PO TABS
15.0000 mg | ORAL_TABLET | Freq: Every evening | ORAL | 0 refills | Status: DC
Start: 2022-02-27 — End: 2022-06-01

## 2022-03-09 ENCOUNTER — Telehealth (INDEPENDENT_AMBULATORY_CARE_PROVIDER_SITE_OTHER): Payer: Self-pay | Admitting: Nurse Practitioner

## 2022-03-09 ENCOUNTER — Encounter (INDEPENDENT_AMBULATORY_CARE_PROVIDER_SITE_OTHER): Payer: Self-pay | Admitting: Nurse Practitioner

## 2022-03-09 NOTE — Telephone Encounter (Signed)
Notified by dtr that pt's BP 96/55 earlier today and now 110/66.  Advised to ck BP prior to giving amlodipine at night and to hold amlodipine if SBP <100.  If BP consistently low, will decrease dose to 2.5 mg.

## 2022-03-20 ENCOUNTER — Telehealth (INDEPENDENT_AMBULATORY_CARE_PROVIDER_SITE_OTHER): Payer: Self-pay | Admitting: Nurse Practitioner

## 2022-03-20 MED ORDER — LIDOCAINE 4 % EX CREA
TOPICAL_CREAM | Freq: Four times a day (QID) | CUTANEOUS | 3 refills | Status: AC | PRN
Start: 2022-03-20 — End: ?

## 2022-03-20 NOTE — Telephone Encounter (Signed)
Dtr contacted this NP to report mother had fallen on 8/22 and c/o increased back pain.  APAP not helping.  Also reported motther's room smelled like urine.  Stated she "dribbles" and refuses to wear panties or a pad at night.  Asked dtr if this is new.    Offered x-ray but dtr replied again that now mother said she is feeling better and no, dribbling is not new.   She will contact office if pain returns.  Suggested Aspercreme with Lidocaine to back and she agreed.  Ordered.

## 2022-04-06 ENCOUNTER — Encounter (INDEPENDENT_AMBULATORY_CARE_PROVIDER_SITE_OTHER): Payer: Self-pay | Admitting: Nurse Practitioner

## 2022-04-06 ENCOUNTER — Ambulatory Visit: Payer: Medicare Other | Admitting: Nurse Practitioner

## 2022-04-06 DIAGNOSIS — W19XXXA Unspecified fall, initial encounter: Secondary | ICD-10-CM

## 2022-04-06 DIAGNOSIS — I10 Essential (primary) hypertension: Secondary | ICD-10-CM

## 2022-04-06 DIAGNOSIS — R569 Unspecified convulsions: Secondary | ICD-10-CM

## 2022-04-06 DIAGNOSIS — B351 Tinea unguium: Secondary | ICD-10-CM

## 2022-04-06 DIAGNOSIS — F4321 Adjustment disorder with depressed mood: Secondary | ICD-10-CM

## 2022-04-06 MED ORDER — BRIVIACT 100 MG PO TABS
1.0000 | ORAL_TABLET | Freq: Every evening | ORAL | 0 refills | Status: DC
Start: 2022-04-06 — End: 2022-04-09

## 2022-04-06 MED ORDER — ALPRAZOLAM 0.25 MG PO TABS
0.2500 mg | ORAL_TABLET | Freq: Two times a day (BID) | ORAL | 0 refills | Status: DC | PRN
Start: 2022-04-06 — End: 2023-10-15

## 2022-04-06 NOTE — Assessment & Plan Note (Addendum)
A/P: No change mirtazapine.  Will order alprazolam 0.25 mg by mouth twice a day as needed for anxiety. To use with caution given her hx of falls. Discussed risk of sedation and daughter will let me know if mother is needing this more than occasionally.

## 2022-04-06 NOTE — Assessment & Plan Note (Addendum)
A/P: If BP more often than not as low as today's (98/58), will consider reducing amlodipine to 2.5 mg. Continue to hold for SPB < 100.   Unsure when next f/u is with Dr. Franchot Erichsen.

## 2022-04-06 NOTE — Assessment & Plan Note (Signed)
A/P: Will await recommendations from neuro.  In the meantime, continue Briviact per their order.

## 2022-04-06 NOTE — Addendum Note (Signed)
Addended by: Olena Leatherwood on: 04/06/2022 09:48 PM     Modules accepted: Orders

## 2022-04-06 NOTE — Progress Notes (Addendum)
Date: 04/06/2022    Patient Name: [name]    Patient was seen in their home (POS 12) in lieu of an office visit for the following reason:   Requires use of an assistive device in order to ambulate due to gait instability.        Code Status: DNAR    HPI:   [name] is a 88 y.o. F that I am here to address chronic problems.  Daughter, [name], was present and supplemented her mother's information.    Problem   [name] [birth year] and c/o LBP.  No head injury or LOC. Treated with Aleve, APAP, Lidocaine and heat.  Resolved.     Onychomycosis    Affecting right toes # 1, 2 and 5.  Using Fungoid Tincture daily but filing nails too aggressively and cutting them too short.  Concerned that it's not getting better.     Seizures    To f/u with neuro on Friday and hopefully that Briviact will be tapered.  Daugher noticed right eye twitching a couple of times about two weeks ago, none since.  Remains on Briviact.  Takes magnesium prn.  Magnesium level 01/22/22 was 2.1.       Situational Depression    Feels mood has improved on mirtazapine.  Has also helped with sleep and appetite.  Daughter feels mother's anxiety increases at times, most recently when mother was in [place] and had to leave a day early.  Was very angry at change of plans.  Was better after she was back in [place].       Hypertension    BP occasionally running low and was 98/58 today.  Occasional dizziness, no CP or SOB.  SBP usually 140-150/ 60-80 but had one reading recently that was 170/84.  Holding amlodipine if SPB < 100.  Last saw Dr. Franchot Erichsen in May.         Review of Systems   Constitutional:  Negative for activity change, appetite change and fever.   Respiratory:  Negative for shortness of breath.    Cardiovascular:  Negative for chest pain and leg swelling.   Gastrointestinal:  Negative for constipation and diarrhea.   Musculoskeletal:  Positive for gait problem. Negative for arthralgias and myalgias.   Skin:         Rt foot onchomycosis    Neurological:  Negative for dizziness and seizures.   Psychiatric/Behavioral:  The patient is nervous/anxious (occasional).        Past Medical History:   Diagnosis Date    Bleeding in head following injury with loss of consciousness     a year ago    Convulsions     Hyperlipidemia     Hypertension     Hypothyroidism     Irregular heartbeat      Past Surgical History:   Procedure Laterality Date    AORTIC VALVE REPLACEMENT      first AVR required sternotomy, >10 years ago per patient    BACK SURGERY      HYSTERECTOMY      REPLACEMENT TOTAL KNEE Bilateral     left total, right "half knee" replacement    TRANSCATHETER AORTIC VALVE REPLACEMENT  2016    5-6 years ago per patient    WRIST SURGERY Left 2017     Family History   Problem Relation Age of Onset    Stroke Mother     Anuerysm Mother     Cancer Father  Heart disease Sister     Heart disease Brother     Intracerebral hemorrhage Neg Hx      Social History     Tobacco Use    Smoking status: Never    Smokeless tobacco: Never   Vaping Use    Vaping Use: Never used   Substance Use Topics    Alcohol use: Not Currently     Alcohol/week: 3.0 standard drinks of alcohol     Types: 3 Glasses of wine per week     Comment: 1-2 a week    Drug use: Never     Allergies   Allergen Reactions    Codeine Nausea And Vomiting       MEDICATIONS:     Current Outpatient Medications:     acetaminophen (TYLENOL) 650 MG CR tablet, Take 1 tablet (650 mg) by mouth every 8 (eight) hours as needed for Pain, Disp: 30 tablet, Rfl: 0    ALPRAZolam (Xanax) 0.25 MG tablet, Take 1 tablet (0.25 mg) by mouth 2 (two) times daily as needed for Anxiety, Disp: 30 tablet, Rfl: 0    amLODIPine (NORVASC) 5 MG tablet, Take 1 tablet (5 mg) by mouth nightly Hold if SBP <100. , Disp: , Rfl:     Brivaracetam (BRIVIACT) 50 MG Tab tablet, Take 1 tablet (50 mg) by mouth every morning, Disp: 90 tablet, Rfl: 1    Briviact 100 MG Tab tablet, TAKE 1 TABLET BY MOUTH EVERY 12 HOURS, Disp: 180 tablet, Rfl: 1    Fungoid  Tincture 2 % Solution, APPLY TWICE A DAY TO TOE NAIL, Disp: , Rfl:     levothyroxine (SYNTHROID) 88 MCG tablet, Take 1 tablet (88 mcg total) by mouth every morning, Disp: 90 tablet, Rfl: 3    lidocaine (LMX) 4 % cream, Apply topically 4 (four) times daily as needed (pain), Disp: 28 g, Rfl: 3    lisinopril (ZESTRIL) 10 MG tablet, TAKE 1 TABLET ORALLY ONCE A DAY EVERY MORNING FOR 30 DAY(S), Disp: 90 tablet, Rfl: 2    Magnesium 400 MG Tab, Take 1 tablet (400 mg) by mouth daily as needed (spasms), Disp: 30 tablet, Rfl: 3    mirtazapine (REMERON) 15 MG tablet, Take 1 tablet (15 mg) by mouth nightly, Disp: 90 tablet, Rfl: 0    Multiple Vitamins-Minerals (Centrum Silver 50+Women) Tab, Take 1 tablet by mouth daily, Disp: 100 tablet, Rfl: 3    Psyllium (Metamucil MultiHealth Fiber) 63 % Powder, Take 1 scoop. by mouth daily as needed (Constipation), Disp: 660 g, Rfl: 3    rosuvastatin (CRESTOR) 20 MG tablet, Take 1 tablet (20 mg total) by mouth nightly, Disp: 90 tablet, Rfl: 3    urea (CARMOL) 40 % cream, APPLY 1 GRAM ONCE A DAY TO NAILS AND CALLUSES, Disp: , Rfl:     PHYSICAL EXAM:   BP 98/58   Pulse 91   Temp 97.2 F (36.2 C)   Resp 18   SpO2 98%    Wt Readings from Last 1 Encounters:   10/29/21 64.9 kg (143 lb)      Ht Readings from Last 1 Encounters:   10/10/21 1.6 m (5\' 3" )     Physical Exam  Constitutional:       General: She is not in acute distress.     Appearance: She is not ill-appearing or toxic-appearing.   Cardiovascular:      Rate and Rhythm: Normal rate and regular rhythm.      Pulses: Normal pulses.  Heart sounds: Normal heart sounds.   Pulmonary:      Effort: Pulmonary effort is normal.      Breath sounds: Normal breath sounds.   Abdominal:      General: Bowel sounds are normal.      Palpations: Abdomen is soft.   Musculoskeletal:      Right lower leg: No edema.      Left lower leg: No edema.   Skin:     Comments: Onchomycosis rt toes #1,2 & 5   Neurological:      Mental Status: She is alert and  oriented to person, place, and time.      Gait: Gait abnormal.   Psychiatric:         Mood and Affect: Mood and affect normal.       DIAGNOSTICS:     Lab Results   Component Value Date    WBC 5.72 01/29/2022    HGB 13.0 01/29/2022    HCT 39.0 01/29/2022    PLT 243 01/29/2022    CHOL 121 03/10/2021    TRIG 96 03/10/2021    HDL 35 (L) 03/10/2021    LDL 67 03/10/2021    ALT 21 01/22/2022    AST 36 01/22/2022    NA 136 01/29/2022    K 4.5 01/29/2022    CL 101 01/29/2022    CREAT 0.9 01/29/2022    BUN 23.0 (H) 01/29/2022    CO2 27 01/29/2022    TSH 1.17 01/22/2022    INR 1.0 03/10/2021    GLU 90 01/29/2022    HGBA1C 5.6 03/10/2021    ALKPHOS 66 01/22/2022     No results found.    ASSESSMENT and PLAN:   Seizures  A/P: Will await recommendations from neuro.  In the meantime, continue Briviact per their order.    Hypertension  A/P: If BP more often than not as low as today's (98/58), will consider reducing amlodipine to 2.5 mg. Continue to hold for SPB < 100.   Unsure when next f/u is with Dr. Franchot Erichsen.    Onychomycosis  A/P: Reviewed appropriate nail care and use of nail lacquer.  Advised not to file or trim nails too short and to make sure lacquer is applied on the nail as well as area around the nail.  Reminded that this takes a long time to "cure."    Situational depression  A/P: No change mirtazapine.  Will order alprazolam 0.25 mg by mouth twice a day as needed for anxiety. To use with caution given her hx of falls. Discussed risk of sedation and daughter will let me know if mother is needing this more than occasionally.     Fall  A/P:  Needs to be careful when moving about to avoid falling and request help when needed.  Use walker.    Medications Ordered This Encounter         Disp Refills Start End    ALPRAZolam (Xanax) 0.25 MG tablet 30 tablet 0 04/06/2022     Take 1 tablet (0.25 mg) by mouth 2 (two) times daily as needed for Anxiety - Oral          No orders of the defined types were placed in this  encounter.    Vale was seen today for follow-up, seizures, hypertension, nail problem, depression and fall.    Diagnoses and all orders for this visit:    Seizures    Primary hypertension    Onychomycosis    Situational depression  Fall, initial encounter    Other orders  -     ALPRAZolam (Xanax) 0.25 MG tablet; Take 1 tablet (0.25 mg) by mouth 2 (two) times daily as needed for Anxiety        Electronically Signed by Zetta Bills, NP

## 2022-04-06 NOTE — Assessment & Plan Note (Signed)
A/P: Reviewed appropriate nail care and use of nail lacquer.  Advised not to file or trim nails too short and to make sure lacquer is applied on the nail as well as area around the nail.  Reminded that this takes a long time to "cure."

## 2022-04-06 NOTE — Assessment & Plan Note (Signed)
A/P:  Needs to be careful when moving about to avoid falling and request help when needed.  Use walker.

## 2022-04-07 ENCOUNTER — Encounter (INDEPENDENT_AMBULATORY_CARE_PROVIDER_SITE_OTHER): Payer: Medicare Other | Admitting: Nurse Practitioner

## 2022-04-10 ENCOUNTER — Encounter: Payer: Self-pay | Admitting: Neurology

## 2022-04-10 ENCOUNTER — Ambulatory Visit: Payer: Medicare Other | Attending: Neurology | Admitting: Neurology

## 2022-04-10 DIAGNOSIS — S065X9A Traumatic subdural hemorrhage with loss of consciousness of unspecified duration, initial encounter: Secondary | ICD-10-CM

## 2022-04-10 DIAGNOSIS — R569 Unspecified convulsions: Secondary | ICD-10-CM | POA: Insufficient documentation

## 2022-04-10 DIAGNOSIS — G936 Cerebral edema: Secondary | ICD-10-CM

## 2022-04-10 DIAGNOSIS — R55 Syncope and collapse: Secondary | ICD-10-CM

## 2022-04-10 DIAGNOSIS — G40109 Localization-related (focal) (partial) symptomatic epilepsy and epileptic syndromes with simple partial seizures, not intractable, without status epilepticus: Secondary | ICD-10-CM

## 2022-04-10 DIAGNOSIS — R4701 Aphasia: Secondary | ICD-10-CM

## 2022-04-10 MED ORDER — BRIVIACT 100 MG PO TABS
1.0000 | ORAL_TABLET | Freq: Every evening | ORAL | 1 refills | Status: DC
Start: 2022-04-10 — End: 2022-10-06

## 2022-04-10 MED ORDER — BRIVARACETAM 50 MG PO TABS
50.0000 mg | ORAL_TABLET | Freq: Every morning | ORAL | 1 refills | Status: DC
Start: 2022-04-10 — End: 2022-10-06

## 2022-04-10 MED ORDER — MAGNESIUM 400 MG PO TABS
1.0000 | ORAL_TABLET | Freq: Every day | ORAL | 1 refills | Status: DC | PRN
Start: 2022-04-10 — End: 2022-10-06

## 2022-04-10 NOTE — Progress Notes (Signed)
Subjective:       Please see detailed assessment and plan    Patient ID: [name] is a 88 y.o. 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    Current visit:     See detailed assessment / plan for the new developments since last visit and the respective new recommendations.     HPI  Background from Dr. Boone Master discharge note 02/2021 "88 y.o. 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 She was doing well after the trauma admit until ~10:30 today when at brunch it was difficult to get her attention even when calling out her name. Later she was found sitting with her head down in her hands and again it was difficult to get her attention. When she spoke there was word finding difficulty and some word salad. She appeared to have expressive and receptive aphasia for me. She denies headache. These symptoms are sudden onset, moderate intensity, without alleviating factors.    Her daughter also noted one episode of right hand shaking last  admission making it difficult for her to use a spoon. Her daughter has noted infrequent right facial twitching which started prior to the trauma admission."    Of note: Once patient was placed on briviact, her coomunication issues were gone.  For about 5 years before she was having right sided facial twitching,  which also stopped after starting Briviact.  Next CT scheduled 10/18/ 2022 with followup with neurosurgery..  Mother usually lives in [place] by herself and is here now to stay.  Emphasized no driving for at least 6 months per state law.    cvEEG 03/12/2021  IMPRESSION: Abnormal EEG due to:   1. Occasional left temporal intermittent rhythmic delta activity (TIRDA)   2. Left anterior to midtemporal focal slowing    MRI Brain 03/10/2021  IMPRESSION:   1. No acute infarct.   2. Evolution and decreased size of hematoma in the lateral left temporal lobe and smaller hemorrhagic contusion in the right parietal lobe with surrounding edema as detailed. No interval hemorrhage compared to the prior 03/04/2021 MRI  3. Stable left subdural hematoma and sequela of prior subarachnoid hemorrhage as detailed above.  4. Mild global volume loss, chronic small vessel ischemic changes and multiple chronic cerebellar infarcts.     CT head 03/21/2021  IMPRESSION:    1.  Interval resolution of parenchymal hemorrhage associated with a prior left MCA/temporal lobe infarct since prior noncontrast head CT.   2.  Improved subdural hematoma along the left tentorial leaflet and posterior cerebral falx with only trace residual.   3.  No new hemorrhage or evidence  of a new large vascular territory infarct.  4.  Otherwise similar chronic findings, as above, as on prior imaging.    Review of Systems  No other motor, sensory, vision, cognitive, speech, swallow, bowel, or bladder changes; no tremors, convulsions, seizure activity, or loss of consciousness episodes.  All other systems were reviewed and were negative.     Current Outpatient Medications  on File Prior to Visit   Medication Sig Dispense Refill    ALPRAZolam (Xanax) 0.25 MG tablet Take 1 tablet (0.25 mg) by mouth 2 (two) times daily as needed for Anxiety 30 tablet 0    amLODIPine (NORVASC) 5 MG tablet Take 1 tablet (5 mg) by mouth nightly Hold if SBP <100.      brivaracetam (Briviact) 100 MG Tab tablet Take 1 tablet (100 mg) by mouth every evening 1 tablet 0    Brivaracetam (BRIVIACT) 50 MG Tab tablet Take 1 tablet (50 mg) by mouth every morning 90 tablet 1    Fungoid Tincture 2 % Solution APPLY TWICE A DAY TO TOE NAIL      levothyroxine (SYNTHROID) 88 MCG tablet Take 1 tablet (88 mcg total) by mouth every morning 90 tablet 3    lidocaine (LMX) 4 % cream Apply topically 4 (four) times daily as needed (pain) 28 g 3    lisinopril (ZESTRIL) 10 MG tablet TAKE 1 TABLET ORALLY ONCE A DAY EVERY MORNING FOR 30 DAY(S) 90 tablet 2    mirtazapine (REMERON) 15 MG tablet Take 1 tablet (15 mg) by mouth nightly 90 tablet 0    Multiple Vitamins-Minerals (Centrum Silver 50+Women) Tab Take 1 tablet by mouth daily 100 tablet 3    Psyllium (Metamucil MultiHealth Fiber) 63 % Powder Take 1 scoop. by mouth daily as needed (Constipation) 660 g 3    rosuvastatin (CRESTOR) 20 MG tablet Take 1 tablet (20 mg total) by mouth nightly 90 tablet 3    acetaminophen (TYLENOL) 650 MG CR tablet Take 1 tablet (650 mg) by mouth every 8 (eight) hours as needed for Pain (Patient not taking: Reported on 04/10/2022) 30 tablet 0    Magnesium 400 MG Tab Take 1 tablet (400 mg) by mouth daily as needed (spasms) (Patient not taking: Reported on 04/10/2022) 30 tablet 3    urea (CARMOL) 40 % cream APPLY 1 GRAM ONCE A DAY TO NAILS AND CALLUSES (Patient not taking: Reported on 04/10/2022)       No current facility-administered medications on file prior to visit.     Current/Home Medications    ACETAMINOPHEN (TYLENOL) 650 MG CR TABLET    Take 1 tablet (650 mg) by mouth every 8 (eight) hours as needed for Pain    ALPRAZOLAM (XANAX) 0.25 MG TABLET     Take 1 tablet (0.25 mg) by mouth 2 (two) times daily as needed for Anxiety    AMLODIPINE (NORVASC) 5 MG TABLET    Take 1 tablet (5 mg) by mouth nightly Hold if SBP <100.    BRIVARACETAM (BRIVIACT) 100 MG TAB TABLET    Take 1 tablet (100 mg) by mouth every evening    BRIVARACETAM (BRIVIACT) 50 MG TAB TABLET    Take 1 tablet (50 mg) by mouth every morning    FUNGOID TINCTURE 2 % SOLUTION    APPLY TWICE A DAY TO TOE NAIL    LEVOTHYROXINE (SYNTHROID) 88 MCG TABLET    Take 1 tablet (88 mcg total) by mouth every morning    LIDOCAINE (LMX) 4 % CREAM  Apply topically 4 (four) times daily as needed (pain)    LISINOPRIL (ZESTRIL) 10 MG TABLET    TAKE 1 TABLET ORALLY ONCE A DAY EVERY MORNING FOR 30 DAY(S)    MAGNESIUM 400 MG TAB    Take 1 tablet (400 mg) by mouth daily as needed (spasms)    MIRTAZAPINE (REMERON) 15 MG TABLET    Take 1 tablet (15 mg) by mouth nightly    MULTIPLE VITAMINS-MINERALS (CENTRUM SILVER 50+WOMEN) TAB    Take 1 tablet by mouth daily    PSYLLIUM (METAMUCIL MULTIHEALTH FIBER) 63 % POWDER    Take 1 scoop. by mouth daily as needed (Constipation)    ROSUVASTATIN (CRESTOR) 20 MG TABLET    Take 1 tablet (20 mg total) by mouth nightly    UREA (CARMOL) 40 % CREAM    APPLY 1 GRAM ONCE A DAY TO NAILS AND CALLUSES     Allergies   Allergen Reactions    Codeine Nausea And Vomiting      Patient Active Problem List    Diagnosis Date Noted    Fall [W19.XXXA] 02/01/2022    Diarrhea [R19.7] 02/01/2022    Syncope [R55] 11/25/2021    Onychomycosis [B35.1] 10/29/2021    Neck pain, bilateral posterior [M54.2] 09/11/2021    Loss of appetite [R63.0] 08/14/2021    Fatigue [R53.83] 08/14/2021    Seizures [R56.9] 08/14/2021    Traumatic subdural hemorrhage with loss of consciousness of unspecified duration, initial encounter [S06.5X9A] 08/06/2021    Situational depression [F43.21] 07/13/2021    Post-COVID syndrome resolved [Z86.16] 07/13/2021    Toe pain, left [M79.675] 05/07/2021    Constipation [K59.00] 05/07/2021     Hypertension [I10] 05/07/2021    Hyperlipidemia [E78.5] 05/07/2021    Palpitations [R00.2] 04/21/2021    S/p TAVR (transcatheter aortic valve replacement), 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] 04/18/2021    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] 04/18/2021    Hypothyroidism [E03.9] 04/18/2021    Osteoporosis [M81.0] 04/18/2021    Recurrent UTI [N39.0] 04/18/2021    Gait abnormality [R26.9] 04/18/2021    Cerebral edema [G93.6] 03/10/2021    Left temporal and right parietal hemorrhagic contusions on MRI 03/04/21 [I61.9] 03/10/2021    Subdural hematoma [S06.5XAA] 03/10/2021    Hyponatremia [E87.1] 03/10/2021    Aphasia [R47.01] 03/09/2021    Nontraumatic acute subdural hemorrhage [I62.01] 03/03/2021     Past Surgical History:   Procedure Laterality Date    AORTIC VALVE REPLACEMENT      first AVR required sternotomy, >10 years ago per patient    BACK SURGERY      HYSTERECTOMY      REPLACEMENT TOTAL KNEE Bilateral     left total, right "half knee" replacement    TRANSCATHETER AORTIC VALVE REPLACEMENT  2016    5-6 years ago per patient    WRIST SURGERY Left 2017     Social History     Socioeconomic History    Marital status: Widowed   Tobacco Use    Smoking status: Never    Smokeless tobacco: Never   Vaping Use    Vaping Use: Never used   Substance and Sexual Activity    Alcohol use: Not Currently     Alcohol/week: 3.0 standard drinks of alcohol     Types: 3 Glasses of wine per week     Comment: 1-2 a week    Drug use: Never    Sexual activity: Not Currently     Social Determinants of Health     Financial Resource Strain: Low  Risk  (04/05/2022)    Overall Financial Resource Strain (CARDIA)     Difficulty of Paying Living Expenses: Not hard at all   Food Insecurity: No Food Insecurity (04/05/2022)    Hunger Vital Sign     Worried About Running Out of Food in the Last Year: Never true     Ran Out of Food in the Last Year: Never true   Transportation Needs: No Transportation Needs (04/05/2022)    PRAPARE - Therapist, art  (Medical): No     Lack of Transportation (Non-Medical): No   Physical Activity: Unknown (04/05/2022)    Exercise Vital Sign     Days of Exercise per Week: Patient refused     Minutes of Exercise per Session: 20 min   Stress: No Stress Concern Present (04/05/2022)    [employer] of Occupational Health - Occupational Stress Questionnaire     Feeling of Stress : Not at all   Social Connections: Moderately Integrated (04/05/2022)    Social Connection and Isolation Panel [NHANES]     Frequency of Communication with Friends and Family: More than three times a week     Frequency of Social Gatherings with Friends and Family: More than three times a week     Attends Religious Services: More than 4 times per year     Active Member of Clubs or Organizations: No     Attends Banker Meetings: 1 to 4 times per year     Marital Status: Widowed   Intimate Partner Violence: Not At Risk (04/05/2022)    Humiliation, Afraid, Rape, and Kick questionnaire     Fear of Current or Ex-Partner: No     Emotionally Abused: No     Physically Abused: No     Sexually Abused: No   Housing Stability: Low Risk  (04/06/2022)    Housing Stability Vital Sign     Unable to Pay for Housing in the Last Year: No     Number of Places Lived in the Last Year: 2     Unstable Housing in the Last Year: No     Family History   Problem Relation Age of Onset    Stroke Mother     Anuerysm Mother     Cancer Father     Heart disease Sister     Heart disease Brother     Intracerebral hemorrhage Neg Hx      She  has a past medical history of Bleeding in head following injury with loss of consciousness, Convulsions, Hyperlipidemia, Hypertension, Hypothyroidism, and Irregular heartbeat.        Objective:      Physical Exam Neurologic Exam    Vital Signs:  Reviewed    General: Well developed and well nourished. No acute distress. Cooperative with the exam  ENT: Normal oral mucosa, no ear or nose discharge  Neck: Symmetric, no deformities  CV: RRR  Resp: No  audible wheezing, normal work of breathing  Abd: Soft, nondistended  Skin: Intact, extremities normal in color  Psych: Affect is normal, good insight    Mental Status: The patient is awake, alert and oriented to person, place, and time.  Affect is normal  Fund of knowledge appropriate  Recent and remote memory are intact   Attention span and concentration appear normal.  Language function is normal. There is no evidence of aphasia in conversational speech.    Cranial nerves:   -CN II: Visual fields full to  bedside confrontation   -CN III, IV, VI: Pupils equal, round, and reactive to light; extraocular movements intact; no ptosis              -CN V: Facial sensation intact in V1 through V3 distributions   -CN VII: Face symmetric   -CN VIII: Hearing intact to conversational speech   -CN IX, X: Palate elevates symmetrically; normal phonation   -CN XI: Symmetric full strength of sternocleidomastoid and trapezius muscles   -CN XII: Tongue protrudes midline    Motor: Muscle tone normal without spasticity or flaccidity. No atrophy.  No pronator drift.     UEs:   Deltoid Bicep Tricep WE WF Grip IO   Right 5 5 5 5 5 5 5    Left 5 5 5 5 5 5 5      LEs   HF HE KF KE PF DF   Right 5 5 5 5 5 5    Left 5 5 5 5 5 5      Sensory:   Light touch intact.  Temperature intact.  Vibration intact.    Reflexes:      B T BR P A   Right 2 2 2 2 2    Left 2 2 2 2 2      Plantars:    Coordination: FTN intact, no truncal ataxia. RAMs intact. 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  Patient has been having eye twitching that has been coming and going.  Initially the Briviact helped with the eye twitching then eye twitching returned then went away again.  When asked about PT for gait disturbance, it was noted that patient did not start PT.  Patient went to [place] for a lengthy visit and while her daughter noted that the patient's balance continues to be poor, the daughter noted that patient's shoes are very large and slide around on her.  Daughter reported that she will get the patient thicker socks.  Daughter reports that the patient is pretty steady using her walker, however, does not use her walker at home and uses a wheelchair when out for longer trips.    10/10/21 visit:  reduced briviact helped a lot. Will add magnesium 400. No seizures or auras since last visit. No ER visits. Compliant with medications. Tolerating well.     04/10/22 visit: No seizures or auras since last visit. No ER visits. Compliant with medications. Tolerating well. Compliance is  good. Again readdressed/ discussed the need for AEDs continuation/ duration of medication. Refills sent to the requested pharmacy. Will follow up 6 monthly intervals. She will approach me through my chart if any concerns or questions in the mean time.    Time spent today (including but not limited to EEG review, neuroimaging review, previous clinical notes reviewed, discussion with the patient  regarding the plan and documentation)  = 40 minutes    More than 50% of this office visit was spent counseling the patient on one or more of the following:   Disease state - discussion about the medical condition, diagnostic and treatment options   Medications - indications and side effects   Lifestyle issues as appropriate.           Recommend:   Will add magnesium 400.   Continue Reduced Briviact to 50 mg in Am and 100 mg in PM refills sent to pharmacy    Continue to participate socially, physically and cognitively as much he can to improve brain health  Refills sent the pharmacy as requested:   Continue Seizure precautions, avoid seizure triggers like missing seizure meds, lack of sleep, too much physical/cognitive stress, driving restrictions per state law ( in [place], be aware not to drive for six months from last seizure with LOC/LOA).   Advised patient to use Mychart for any questions in the interm    Return to clinic in 6 months : you can schedule the appt by calling [phone]               Plan:      No orders of the defined types were placed in this encounter.    All relevant and clinical information was transcribed by me, Norvel Richards. Clemon Chambers, NP, acting as a scribe for Dr. Rosalio Macadamia.    Agree     Additional notes and data scanned including patient questionnaire which may contain pertinent information to visit. Patient can follow up sooner if needed. In the meantime, patient will contact the office with any questions or concerns.     Total of 40 minutes were spent on the day of service including face-to-face time with patient, coordinating care, record review and documentation, explaining the natural history of seizures, types of seizures, triggers for seizures.  Need for the necessary testing.  EEG and MRI.  Need for a seizure medication compliance for medications.  Seizure precautions including avoid driving per [place].  6 months from the last seizure.  Side effects of seizure medications.   Avoiding triggers for seizures.  Backup plan, and the time of partial seizure and convulsive seizures.  Including going to the emergency room.  Taking emergency rescue medications.           Richrd Humbles, MD. FAES    Director, [facility]  Assistant Professor, [facility]  Board Certified,Neurology  Board Certified, Clinical Neurophysiology    [email or web address]      This note was generated by the Epic EMR system/ Dragon speech recognition and may contain inherent errors or omissions not intended by the user. Grammatical errors, random word insertions, deletions, pronoun errors and incomplete sentences are occasional consequences of this technology due to software limitations. Not all errors are caught or corrected.Although every attempt is made to root out erroneus and incomplete transcription, the note may still not fully represent the intent or opinion of the author. If there are questions or concerns about the content of this note or  information contained within the body of this dictation they should be addressed directly with the author for clarification.*

## 2022-04-13 NOTE — Progress Notes (Signed)
Please advise 

## 2022-04-17 NOTE — Progress Notes (Signed)
Please call daughter and let her know that she should take patient to get COVID if possible.  We can administer flu if she would like at her November appoitnment

## 2022-04-20 ENCOUNTER — Other Ambulatory Visit (INDEPENDENT_AMBULATORY_CARE_PROVIDER_SITE_OTHER): Payer: Self-pay | Admitting: Nurse Practitioner

## 2022-04-20 DIAGNOSIS — E039 Hypothyroidism, unspecified: Secondary | ICD-10-CM

## 2022-04-20 MED ORDER — LEVOTHYROXINE SODIUM 88 MCG PO TABS
88.0000 ug | ORAL_TABLET | Freq: Every morning | ORAL | 3 refills | Status: DC
Start: 2022-04-20 — End: 2023-02-02

## 2022-04-20 NOTE — Telephone Encounter (Signed)
[facility] asked for synthroid 88 mcg daily refill

## 2022-04-21 NOTE — Progress Notes (Signed)
Writer called and notified daughter that [facility] do not give coivd vaccine. Advised to take patient to get COVID if possible per Dr. Marcelle Overlie.   Writer informed daughter that [facility] can administer flu vaccine at her November appointment. Daughter sated that she would wait until end of November.

## 2022-05-11 ENCOUNTER — Telehealth (INDEPENDENT_AMBULATORY_CARE_PROVIDER_SITE_OTHER): Payer: Self-pay | Admitting: Nurse Practitioner

## 2022-05-11 DIAGNOSIS — N3000 Acute cystitis without hematuria: Secondary | ICD-10-CM

## 2022-05-11 NOTE — Telephone Encounter (Signed)
chySituation  Caller name and facility/agency, contact number for call back:     Sherilyn Banker [phone]    Complaint: frequent trips to bathroom last night      Onset (and if it has gotten better, worse or stayed the same): last night      Any treatment tried (e.g pain meds taken, insulin given): push fluids      Current Medications/Treatment taken as ordered (see SnapShot)   acetaminophen (TYLENOL) 650 MG CR tablet    ALPRAZolam (Xanax) 0.25 MG tablet  amLODIPine (NORVASC) 5 MG tablet  brivaracetam (Briviact) 100 MG Tab tablet  Brivaracetam (BRIVIACT) 50 MG Tab tablet  Fungoid Tincture 2 % Solution    levothyroxine (SYNTHROID) 88 MCG tablet  lidocaine (LMX) 4 % cream  lisinopril (ZESTRIL) 10 MG tablet  Magnesium 400 MG Tab    mirtazapine (REMERON) 15 MG tablet  Multiple Vitamins-Minerals (Centrum Silver 50+Women) Tab  Psyllium (Metamucil MultiHealth Fiber) 63 % Powder  rosuvastatin (CRESTOR) 20 MG tablet  urea (CARMOL) 40 % cream      Background:  Is patient receiving any other home services (such as home health): no      Vitals:     Denies fever      Assessment or Appearance:  The problem may be UTI.      Patient is forgetful, patient keeps flushing.    Denies odor and change in color.  However, daughter has not seen.    No frequent trips to bathroom during the day while up with daughter all day.      No pain   No burning.    Had COVID vaccine today.    Should she take patient to lab to give a urine sample?    Request:    Sherilyn Banker [phone]

## 2022-05-12 MED ORDER — AMOXICILLIN-POT CLAVULANATE 500-125 MG PO TABS
1.0000 | ORAL_TABLET | Freq: Two times a day (BID) | ORAL | 0 refills | Status: AC
Start: 2022-05-12 — End: 2022-05-17

## 2022-05-12 NOTE — Telephone Encounter (Signed)
Discussed with daughter.  Chills, burning with urination.  Took AZO last pm.  She will get sample to [facility].  Cannot do UA as took AZO.      Symptoms started over past 2-3 days.  Had COVID shot yesterday but urinary symptoms started prior.      Will start antibiotics while waiting culture.

## 2022-05-12 NOTE — Telephone Encounter (Signed)
[facility] RN called and spoke with daughter per Dr. Dennie Bible request.    Started Vit C and the antibiotic order.    Dispatch came to see patient this morning (her niece works for them).    [facility] obtained a sample and they will send the results of the labs to Dr. Doylene Canard.

## 2022-05-13 ENCOUNTER — Other Ambulatory Visit (INDEPENDENT_AMBULATORY_CARE_PROVIDER_SITE_OTHER): Payer: Self-pay | Admitting: Nurse Practitioner

## 2022-05-18 ENCOUNTER — Encounter (INDEPENDENT_AMBULATORY_CARE_PROVIDER_SITE_OTHER): Payer: Self-pay | Admitting: Nurse Practitioner

## 2022-05-19 ENCOUNTER — Encounter (INDEPENDENT_AMBULATORY_CARE_PROVIDER_SITE_OTHER): Payer: Self-pay | Admitting: Nurse Practitioner

## 2022-05-29 ENCOUNTER — Encounter (INDEPENDENT_AMBULATORY_CARE_PROVIDER_SITE_OTHER): Payer: Medicare Other | Admitting: Nurse Practitioner

## 2022-06-01 ENCOUNTER — Other Ambulatory Visit (INDEPENDENT_AMBULATORY_CARE_PROVIDER_SITE_OTHER): Payer: Self-pay | Admitting: Nurse Practitioner

## 2022-06-01 MED ORDER — MIRTAZAPINE 15 MG PO TABS
15.0000 mg | ORAL_TABLET | Freq: Every evening | ORAL | 0 refills | Status: DC
Start: 2022-06-01 — End: 2022-07-02

## 2022-06-01 NOTE — Telephone Encounter (Signed)
Please review refill request 

## 2022-06-03 ENCOUNTER — Encounter (INDEPENDENT_AMBULATORY_CARE_PROVIDER_SITE_OTHER): Payer: Medicare Other | Admitting: Nurse Practitioner

## 2022-06-26 ENCOUNTER — Ambulatory Visit: Payer: Medicare Other | Admitting: Nurse Practitioner

## 2022-06-26 VITALS — BP 142/68 | HR 67 | Temp 97.6°F | Resp 18

## 2022-06-26 DIAGNOSIS — R569 Unspecified convulsions: Secondary | ICD-10-CM

## 2022-06-26 DIAGNOSIS — K5901 Slow transit constipation: Secondary | ICD-10-CM

## 2022-06-26 DIAGNOSIS — M545 Low back pain, unspecified: Secondary | ICD-10-CM

## 2022-06-26 DIAGNOSIS — I10 Essential (primary) hypertension: Secondary | ICD-10-CM

## 2022-06-26 DIAGNOSIS — N390 Urinary tract infection, site not specified: Secondary | ICD-10-CM

## 2022-06-26 DIAGNOSIS — G8929 Other chronic pain: Secondary | ICD-10-CM

## 2022-06-26 NOTE — Patient Instructions (Signed)
Women's Preventive Wellness Plan  Today's Date: June 26, 2022    Patient Name:[name]    Date of Birth: Nov 30, 1927     As part of your wellness benefit, Medicare makes many screening tests available to you at no charge.  A complete list of these tests can be found at their website, [email or web address]. However, many of these tests or recommendations are out of date, or may not apply to you. After careful consideration of your own personal health needs, the following testing is recommended for you:      Preventive Service    Up-to-date (UTD)/Due/Not Applicable (N/A)   Last Done   Medicare Frequency   Body Mass Index   Up-to-date June 26, 2022  (BMI):There is no height or weight on file to calculate BMI.   Height:   Weight:  Annually   Blood Pressure: Up-to-date June 26, 2022        Every 2 yrs, if BP </= 120/80 mm hg  Annually, if BP >120-139/80-89 mm hg   Cholesterol Testing Up-to-date Lab Results   Component Value Date    LDL 67 03/10/2021     Regularly beginning at age 26 with risk factors   Diabetes Screening Not applicable Lab Results   Component Value Date    GLU 90 01/29/2022      If prediabetes, one screening every 6 months  Otherwise, one screening every 12 months with certain risk factors for diabetes   Osteoporosis Screening   (Bone Density Measurement)  Up-to-date  Routinely, for women aged 65+  Routinely, for women aged 60-64 with risk factors   Colorectal Cancer Screening Declined  Annually, Fecal Occult Blood Stool (FOBS)  Every 5 yrs, Sigmoidoscopy with FOBS  Every 10 yrs, Colonoscopy  Every 3 yrs, Cologuard   Depression Screening Up-to-date June 26, 2022  As necessary for those with risk factors   Sexually Transmitted Diseases (STDs) & HIV Screening Not medically indicated  As necessary for those with risk factors   Alcohol Misuse Screening Not medically indicated  As necessary for those with risk factors    Immunizations:   Nursing to check registry Immunization History   Administered Date(s) Administered    COVID-19 mRNA MONOVALENT vaccine PRIMARY SERIES 12 years and above [employer]) 30 mcg/0.3 mL (DILUTE BEFORE USE) 08/17/2019, 09/12/2019, 05/11/2022    Prevnar 13: 1 dose after age 67  Pneumovax 23: 1 dose 1 year after Prevnar  Influenza: Annually   Advance Directive Up-to-date  Once; update as needed   Medical Nutrition Therapy Not applicable  As necessary for diabetes or renal disease   Smoking Cessation Counseling Not applicable Counseling given: Not Answered   Frequency: two cessation attempts per year.   Glaucoma Screening Up-to-date  Annually for covered high risk Medicare beneficiaries (one of the following: DM, FHx Glaucoma, African-Americans aged 71+, Hispanic-Americans aged 65+)   Lung Cancer Screening Not applicable  Annually if asymptomatic, tobacco smoking history of at least 30 pack-years (one pack-year = smoking one pack per day for one year; 1 pack = 20 cigarettes), and current smoker or one who has quit smoking within the last 15 years     Your major risk factors:       Falls Risk     Recommendations for improvement:    Exercise     Referrals:    See After Visit Summary orders

## 2022-06-26 NOTE — Progress Notes (Signed)
Date: 06/26/2022    Patient Name: [name]    Patient was seen in their home (POS 12) in lieu of an office visit for the following reason:   Requires use of an assistive device in order to ambulate due to gait instability.        Code Status: DNAR    HPI:   [name] is a 88 y.o. that I am here to address chronic problems.    Problem   Chronic Low Back Pain    Has chronic low back pain; comes and goes.  Using Aspercreme as needed. Denied radiation, numbness and tingling.  Has been doing chair yoga once a week but does not feel this is exacerbating LBP.     Seizures    No seizures lately.  Saw Dr. Becky Sax in September.  To continue Briviact 50 mg am and 100 mg pm; next visit with neurology in March.  To consider tapering Briviact if has remained seizure free.     Constipation    BMs daily.     Hypertension    SBP averages 130-170's however more in 170s .  Denied SOB, CP.  Sometimes gets palpitations but resolves quickly.  Saw cardio, Dr. Franchot Erichsen, in May.  Only needs to see as needed.         Recurrent Uti    Recent UTI cleared with treatment.         Review of Systems   Constitutional:  Negative for appetite change and fatigue.   Respiratory:  Negative for cough and shortness of breath.    Cardiovascular:  Negative for chest pain and leg swelling.   Gastrointestinal:  Negative for constipation.   Genitourinary:  Negative for dysuria, flank pain, hematuria and pelvic pain.   Musculoskeletal:  Positive for back pain.   Neurological:  Positive for dizziness (occasionally). Negative for seizures.   Psychiatric/Behavioral:  Negative for sleep disturbance.        Past Medical History:   Diagnosis Date    Bleeding in head following injury with loss of consciousness     a year ago    Convulsions     Hyperlipidemia     Hypertension     Hypothyroidism     Irregular heartbeat      Past Surgical History:   Procedure Laterality Date    AORTIC VALVE REPLACEMENT      first AVR required sternotomy,  >10 years ago per patient    BACK SURGERY      HYSTERECTOMY      REPLACEMENT TOTAL KNEE Bilateral     left total, right "half knee" replacement    TRANSCATHETER AORTIC VALVE REPLACEMENT  2016    5-6 years ago per patient    WRIST SURGERY Left 2017     Family History   Problem Relation Age of Onset    Stroke Mother     Anuerysm Mother     Cancer Father     Heart disease Sister     Heart disease Brother     Intracerebral hemorrhage Neg Hx      Social History     Tobacco Use    Smoking status: Never    Smokeless tobacco: Never   Vaping Use    Vaping Use: Never used   Substance Use Topics    Alcohol use: Not Currently     Alcohol/week: 3.0 standard drinks of alcohol     Types: 3 Glasses of wine per week  Comment: 1-2 a week    Drug use: Never     Allergies   Allergen Reactions    Codeine Nausea And Vomiting       MEDICATIONS:     Current Outpatient Medications:     acetaminophen (TYLENOL) 650 MG CR tablet, Take 1 tablet (650 mg) by mouth every 8 (eight) hours as needed for Pain (Patient not taking: Reported on 04/10/2022), Disp: 30 tablet, Rfl: 0    ALPRAZolam (Xanax) 0.25 MG tablet, Take 1 tablet (0.25 mg) by mouth 2 (two) times daily as needed for Anxiety, Disp: 30 tablet, Rfl: 0    amLODIPine (NORVASC) 5 MG tablet, Take 1 tablet (5 mg) by mouth nightly Hold if SBP <100., Disp: , Rfl:     brivaracetam (Briviact) 100 MG Tab tablet, Take 1 tablet (100 mg) by mouth every evening, Disp: 90 tablet, Rfl: 1    Brivaracetam (BRIVIACT) 50 MG Tab tablet, Take 1 tablet (50 mg) by mouth every morning, Disp: 90 tablet, Rfl: 1    Fungoid Tincture 2 % Solution, APPLY TWICE A DAY TO TOE NAIL, Disp: , Rfl:     levothyroxine (SYNTHROID) 88 MCG tablet, Take 1 tablet (88 mcg) by mouth every morning, Disp: 90 tablet, Rfl: 3    lidocaine (LMX) 4 % cream, Apply topically 4 (four) times daily as needed (pain), Disp: 28 g, Rfl: 3    lisinopril (ZESTRIL) 10 MG tablet, TAKE 1 TABLET ORALLY ONCE A DAY EVERY MORNING FOR 30 DAY(S), Disp: 90  tablet, Rfl: 2    Magnesium 400 MG Tab, Take 1 tablet (400 mg) by mouth daily as needed (spasms), Disp: 90 tablet, Rfl: 1    mirtazapine (REMERON) 15 MG tablet, Take 1 tablet (15 mg) by mouth nightly, Disp: 90 tablet, Rfl: 0    Multiple Vitamins-Minerals (Centrum Silver 50+Women) Tab, Take 1 tablet by mouth daily, Disp: 100 tablet, Rfl: 3    Psyllium (Metamucil MultiHealth Fiber) 63 % Powder, Take 1 scoop. by mouth daily as needed (Constipation), Disp: 660 g, Rfl: 3    rosuvastatin (CRESTOR) 20 MG tablet, Take 1 tablet (20 mg total) by mouth nightly, Disp: 90 tablet, Rfl: 3    urea (CARMOL) 40 % cream, APPLY 1 GRAM ONCE A DAY TO NAILS AND CALLUSES (Patient not taking: Reported on 04/10/2022), Disp: , Rfl:     PHYSICAL EXAM:   BP 142/68   Pulse 67   Temp 97.6 F (36.4 C)   Resp 18   SpO2 96%    Wt Readings from Last 1 Encounters:   04/10/22 65.8 kg (145 lb)      Ht Readings from Last 1 Encounters:   04/10/22 1.6 m (5\' 3" )     Physical Exam  Constitutional:       General: She is not in acute distress.     Appearance: She is not ill-appearing or toxic-appearing.   Cardiovascular:      Rate and Rhythm: Normal rate.      Heart sounds: Murmur (4/6 pan SEM) heard.   Pulmonary:      Effort: Pulmonary effort is normal.      Breath sounds: Normal breath sounds.   Abdominal:      General: Bowel sounds are normal.      Palpations: Abdomen is soft.   Musculoskeletal:      Right lower leg: No edema.      Left lower leg: No edema.   Neurological:      Mental Status: She  is alert and oriented to person, place, and time.   Psychiatric:         Mood and Affect: Mood and affect normal.       DIAGNOSTICS:     Lab Results   Component Value Date    WBC 5.72 01/29/2022    HGB 13.0 01/29/2022    HCT 39.0 01/29/2022    PLT 243 01/29/2022    CHOL 121 03/10/2021    TRIG 96 03/10/2021    HDL 35 (L) 03/10/2021    LDL 67 03/10/2021    ALT 21 01/22/2022    AST 36 01/22/2022    NA 136 01/29/2022    K 4.5 01/29/2022    CL 101 01/29/2022     CREAT 0.9 01/29/2022    BUN 23.0 (H) 01/29/2022    CO2 27 01/29/2022    TSH 1.17 01/22/2022    INR 1.0 03/10/2021    GLU 90 01/29/2022    HGBA1C 5.6 03/10/2021    ALKPHOS 66 01/22/2022     No results found.    ASSESSMENT and PLAN:   Hypertension  A/P:  No change amlodipine 5 mg however if SBP > 160, give two 5 mg tabs (10 mg).  If is consistently higher, they will contact [facility].  She will be traveling to [place] for several weeks Feb-March and dtr will check meds before she leaves.  Due for labs - BMP and CBC before she leaves.    Chronic low back pain  A/P:  Continue chair yoga unless she feels this is causing more pain.  Continue Aspercreme prn.    Seizures  A/P:  Np change in Briviact.  Follow-up with Dr. Becky Sax in March.    Recurrent UTI  A/P: Resolved.    Constipation  A/P:  Continue psyllium as needed.  Maintain adequate fluid intake.      Orders Placed This Encounter   Procedures    CBC without differential     Standing Status:   Future     Standing Expiration Date:   06/27/2023     Order Specific Question:   Release to patient     Answer:   Immediate    Basic Metabolic Panel     Standing Status:   Future     Standing Expiration Date:   06/27/2023     Order Specific Question:   Has the patient fasted?     Answer:   No     Order Specific Question:   Release to patient     Answer:   Immediate     [name] was seen today for hypertension, constipation, seizures, urinary tract infection symptoms and back pain.    Diagnoses and all orders for this visit:    Primary hypertension  -     CBC without differential; Future  -     Basic Metabolic Panel; Future    Chronic bilateral low back pain without sciatica    Seizures    Recurrent UTI    Slow transit constipation        Electronically Signed by Zetta Bills, NP    Date: 06/26/2022    Patient Name: [name]    Medicare AWV Note:    Patient was seen in their home (POS 12) in lieu of an office visit for the following reason:   Requires use of an assistive  device in order to ambulate due to gait instability.Marland Kitchen    LEGAL DOCUMENTS and CODE STATUS:   Advance Directive  Received: Yes   Type of Document Received:  DDNR       Code Status: DNAR    Health Risk Assessment:   1)  What is your living situation?  family assistance at home    2)  Do you have help available to you for activities of daily living ? yes, family provides assistance    3)  Do you have help available to you for medication administration?  yes, family provides assistance    4)  Can you handle your money without help? yes, family provides help    5)  Do you have trouble paying for your medications?  no     6)  Do you worry about falling? yes    7)  Have you had any falls in the past year? yes, no injury    8)  Are you using any of the following to avoid falls?  walker    9)  Mobility Rating:  ambulating with exercise at least 3x a week    10)  Home Safety Evaluation:  grab bars in bathroom    11)  DME in home:  walker    12)  Are you feeling down, depressed or hopeless? no     13)  Are you finding joy in anything? yes     14)  Has your weight changed in the past 6 months? no change in weight      Additional Concerns    Patient Care Team:  Zetta Bills, NP as PCP - General (Nurse Practitioner)  Royann Shivers, MD as Consulting Physician (Cardiology)  Lady Saucier as Consulting Physician (Cardiology)  May, Robert as Consulting Physician (Urology)  Marline Backbone as Consulting Physician (Neurology)  Burnett Sheng as Consulting Physician (Podiatry)  Arletta Bale as Primary Care Provider  Micheline Chapman, Jon Gills  Toward, Harriett Sine, LCSW as Licensed Clinical Social Worker (Licensed Clinical Social Worker)  [place], Database administrator, Charity fundraiser (Inactive) as Designer, jewellery    Past Medical History:   Diagnosis Date    Bleeding in head following injury with loss of consciousness     a year ago    Convulsions     Hyperlipidemia     Hypertension     Hypothyroidism     Irregular heartbeat      Past Surgical History:    Procedure Laterality Date    AORTIC VALVE REPLACEMENT      first AVR required sternotomy, >10 years ago per patient    BACK SURGERY      HYSTERECTOMY      REPLACEMENT TOTAL KNEE Bilateral     left total, right "half knee" replacement    TRANSCATHETER AORTIC VALVE REPLACEMENT  2016    5-6 years ago per patient    WRIST SURGERY Left 2017     Allergies   Allergen Reactions    Codeine Nausea And Vomiting      No outpatient medications have been marked as taking for the 06/26/22 encounter (Office Visit) with Zetta Bills, NP.     Social History     Tobacco Use    Smoking status: Never    Smokeless tobacco: Never   Vaping Use    Vaping Use: Never used   Substance Use Topics    Alcohol use: Not Currently     Alcohol/week: 3.0 standard drinks of alcohol     Types: 3 Glasses of wine per week     Comment: 1-2 a week    Drug use: Never  Family History   Problem Relation Age of Onset    Stroke Mother     Anuerysm Mother     Cancer Father     Heart disease Sister     Heart disease Brother     Intracerebral hemorrhage Neg Hx          The following sections were reviewed this encounter by the provider:   Tobacco  Allergies  Meds  Problems  Med Hx  Surg Hx  Fam Hx           Hospitalizations:   no hospitalizations within past year    Depression Screening:   No data recorded     Functional Assessment:   Falls Risk:  home does not have throw rugs, poor lighting or a slippery bath tub or shower  Hearing:  hearing aid - bilateral  Exercise:  Light ( i.e. stretching or slow walking )  ADL's:   Bathing - independent   Dressing - independent   Mobility - independent   Transfer - independent   Eating - independent}   Toileting - independent   ADL assistance not needed    Assessment:   BP 142/68   Pulse 67   Temp 97.6 F (36.4 C)   Resp 18   SpO2 96%      Cognitive Function:   Mood/affect: Appropriate  Appearance: neatly groomed, appropriately and adequately nourished  Family member/caregiver input: No  concerns        AWV Mini-Cog Result:  > 3 points - negative screen for dementia      Assessment/Plan:   1. Primary hypertension  - CBC without differential; Future  - Basic Metabolic Panel; Future    2. Chronic bilateral low back pain without sciatica    3. Seizures    4. Recurrent UTI    5. Slow transit constipation          Zetta Bills, NP  06/27/2022

## 2022-06-27 ENCOUNTER — Encounter (INDEPENDENT_AMBULATORY_CARE_PROVIDER_SITE_OTHER): Payer: Self-pay | Admitting: Nurse Practitioner

## 2022-06-27 DIAGNOSIS — M545 Low back pain, unspecified: Secondary | ICD-10-CM | POA: Insufficient documentation

## 2022-06-27 NOTE — Assessment & Plan Note (Signed)
A/P:  Continue chair yoga unless she feels this is causing more pain.  Continue Aspercreme prn.

## 2022-06-27 NOTE — Assessment & Plan Note (Addendum)
A/P:  No change amlodipine 5 mg however if SBP > 160, give two 5 mg tabs (10 mg).  If is consistently higher, they will contact [facility].  She will be traveling to [place] for several weeks Feb-March and dtr will check meds before she leaves.  Due for labs - BMP and CBC before she leaves.

## 2022-06-27 NOTE — Assessment & Plan Note (Signed)
A/P: Resolved

## 2022-06-27 NOTE — Assessment & Plan Note (Signed)
A/P:  Continue psyllium as needed.  Maintain adequate fluid intake.

## 2022-06-27 NOTE — Assessment & Plan Note (Signed)
A/P:  Np change in Briviact.  Follow-up with Dr. Becky Sax in March.

## 2022-07-02 ENCOUNTER — Other Ambulatory Visit (INDEPENDENT_AMBULATORY_CARE_PROVIDER_SITE_OTHER): Payer: Self-pay | Admitting: Nurse Practitioner

## 2022-07-02 ENCOUNTER — Ambulatory Visit
Admission: RE | Admit: 2022-07-02 | Discharge: 2022-07-02 | Disposition: A | Discharge status: Home / Self Care | Payer: Medicare Other | Source: Ambulatory Visit | Attending: Nurse Practitioner | Admitting: Nurse Practitioner

## 2022-07-02 DIAGNOSIS — E785 Hyperlipidemia, unspecified: Secondary | ICD-10-CM

## 2022-07-02 DIAGNOSIS — I10 Essential (primary) hypertension: Secondary | ICD-10-CM | POA: Insufficient documentation

## 2022-07-02 LAB — BASIC METABOLIC PANEL
Anion Gap: 6 (ref 5.0–15.0)
BUN: 30 mg/dL — ABNORMAL HIGH (ref 7.0–21.0)
CO2: 25 mEq/L (ref 17–29)
Calcium: 9 mg/dL (ref 7.9–10.2)
Chloride: 101 mEq/L (ref 99–111)
Creatinine: 0.9 mg/dL (ref 0.4–1.0)
Glucose: 94 mg/dL (ref 70–100)
Potassium: 4.2 mEq/L (ref 3.5–5.3)
Sodium: 132 mEq/L — ABNORMAL LOW (ref 135–145)
eGFR: 59.8 mL/min/{1.73_m2} — AB (ref >=60)

## 2022-07-02 LAB — CBC
Absolute NRBC: 0 10*3/uL (ref 0.00–0.00)
Hematocrit: 39.3 % (ref 34.7–43.7)
Hgb: 12.7 g/dL (ref 11.4–14.8)
MCH: 31.8 pg (ref 25.1–33.5)
MCHC: 32.3 g/dL (ref 31.5–35.8)
MCV: 98.3 fL — ABNORMAL HIGH (ref 78.0–96.0)
MPV: 9.6 fL (ref 8.9–12.5)
Nucleated RBC: 0 /100 WBC (ref 0.0–0.0)
Platelets: 202 10*3/uL (ref 142–346)
RBC: 4 10*6/uL (ref 3.90–5.10)
RDW: 13 % (ref 11–15)
WBC: 5.36 10*3/uL (ref 3.10–9.50)

## 2022-07-02 LAB — HEMOLYSIS INDEX: Hemolysis Index: 7 Index (ref 0–24)

## 2022-07-02 MED ORDER — AMLODIPINE BESYLATE 5 MG PO TABS
5.0000 mg | ORAL_TABLET | Freq: Every evening | ORAL | 2 refills | Status: DC
Start: 2022-07-02 — End: 2022-09-28

## 2022-07-02 MED ORDER — MIRTAZAPINE 15 MG PO TABS
15.0000 mg | ORAL_TABLET | Freq: Every evening | ORAL | 0 refills | Status: DC
Start: 2022-07-02 — End: 2022-11-30

## 2022-07-02 MED ORDER — ROSUVASTATIN CALCIUM 20 MG PO TABS
20.0000 mg | ORAL_TABLET | Freq: Every evening | ORAL | 3 refills | Status: DC
Start: 2022-07-02 — End: 2023-03-29

## 2022-07-21 ENCOUNTER — Telehealth (INDEPENDENT_AMBULATORY_CARE_PROVIDER_SITE_OTHER): Payer: Self-pay | Admitting: Nurse Practitioner

## 2022-07-21 MED ORDER — GUAIFENESIN 100 MG/5ML PO LIQD
200.0000 mg | Freq: Three times a day (TID) | ORAL | 1 refills | Status: DC | PRN
Start: 2022-07-21 — End: 2023-01-29

## 2022-07-21 NOTE — Telephone Encounter (Signed)
Dtr called to report mother coughing.  Afebrile.  Covid neg.  Rx for Robitussin sent to pharmacy

## 2022-08-07 ENCOUNTER — Telehealth (INDEPENDENT_AMBULATORY_CARE_PROVIDER_SITE_OTHER): Payer: Self-pay | Admitting: Nurse Practitioner

## 2022-08-07 NOTE — Telephone Encounter (Signed)
Dtr sent message that mother received RSV vaccine today.

## 2022-08-10 ENCOUNTER — Encounter (INDEPENDENT_AMBULATORY_CARE_PROVIDER_SITE_OTHER): Payer: Self-pay | Admitting: Nurse Practitioner

## 2022-08-12 ENCOUNTER — Other Ambulatory Visit (INDEPENDENT_AMBULATORY_CARE_PROVIDER_SITE_OTHER): Payer: Self-pay | Admitting: Nurse Practitioner

## 2022-08-12 DIAGNOSIS — I10 Essential (primary) hypertension: Secondary | ICD-10-CM

## 2022-08-12 MED ORDER — LISINOPRIL 10 MG PO TABS
ORAL_TABLET | ORAL | 2 refills | Status: DC
Start: 2022-08-12 — End: 2023-05-06

## 2022-09-28 ENCOUNTER — Other Ambulatory Visit (INDEPENDENT_AMBULATORY_CARE_PROVIDER_SITE_OTHER): Payer: Self-pay | Admitting: Nurse Practitioner

## 2022-09-28 MED ORDER — AMLODIPINE BESYLATE 5 MG PO TABS
5.0000 mg | ORAL_TABLET | Freq: Every evening | ORAL | 2 refills | Status: DC
Start: 2022-09-28 — End: 2022-11-09

## 2022-09-28 NOTE — Telephone Encounter (Signed)
Refill request came through fax.   Please review

## 2022-10-02 ENCOUNTER — Ambulatory Visit: Payer: Medicare Other | Admitting: Nurse Practitioner

## 2022-10-02 DIAGNOSIS — F4321 Adjustment disorder with depressed mood: Secondary | ICD-10-CM

## 2022-10-02 DIAGNOSIS — M79641 Pain in right hand: Secondary | ICD-10-CM

## 2022-10-02 DIAGNOSIS — M25511 Pain in right shoulder: Secondary | ICD-10-CM

## 2022-10-02 DIAGNOSIS — R569 Unspecified convulsions: Secondary | ICD-10-CM

## 2022-10-02 DIAGNOSIS — B351 Tinea unguium: Secondary | ICD-10-CM

## 2022-10-02 DIAGNOSIS — F419 Anxiety disorder, unspecified: Secondary | ICD-10-CM

## 2022-10-02 DIAGNOSIS — M79671 Pain in right foot: Secondary | ICD-10-CM

## 2022-10-02 DIAGNOSIS — M545 Low back pain, unspecified: Secondary | ICD-10-CM

## 2022-10-02 DIAGNOSIS — K5901 Slow transit constipation: Secondary | ICD-10-CM

## 2022-10-02 DIAGNOSIS — G8929 Other chronic pain: Secondary | ICD-10-CM

## 2022-10-02 NOTE — Progress Notes (Unsigned)
Date: 10/02/2022    Patient Name: [name]    Patient was seen in their home (POS 12) in lieu of an office visit for the following reason:   [place] Criteria:40864}        Code Status: [place] [id number]    HPI:   [name] is a 89 y.o. year old were are here to address chronic problems.    No problems updated.    Review of Systems    Past Medical History:   Diagnosis Date    Bleeding in head following injury with loss of consciousness     a year ago    Convulsions     Hyperlipidemia     Hypertension     Hypothyroidism     Irregular heartbeat      Past Surgical History:   Procedure Laterality Date    AORTIC VALVE REPLACEMENT      first AVR required sternotomy, >10 years ago per patient    BACK SURGERY      HYSTERECTOMY      REPLACEMENT TOTAL KNEE Bilateral     left total, right "half knee" replacement    TRANSCATHETER AORTIC VALVE REPLACEMENT  2016    5-6 years ago per patient    WRIST SURGERY Left 2017     Family History   Problem Relation Age of Onset    Stroke Mother     Anuerysm Mother     Cancer Father     Heart disease Sister     Heart disease Brother     Intracerebral hemorrhage Neg Hx      Social History     Tobacco Use    Smoking status: Never    Smokeless tobacco: Never   Vaping Use    Vaping Use: Never used   Substance Use Topics    Alcohol use: Not Currently     Alcohol/week: 3.0 standard drinks of alcohol     Types: 3 Glasses of wine per week     Comment: 1-2 a week    Drug use: Never     Allergies   Allergen Reactions    Codeine Nausea And Vomiting       MEDICATIONS:     Current Outpatient Medications:     acetaminophen (TYLENOL) 650 MG CR tablet, Take 1 tablet (650 mg) by mouth every 8 (eight) hours as needed for Pain (Patient not taking: Reported on 04/10/2022), Disp: 30 tablet, Rfl: 0    ALPRAZolam (Xanax) 0.25 MG tablet, Take 1 tablet (0.25 mg) by mouth 2 (two) times daily as needed for Anxiety, Disp: 30 tablet, Rfl: 0    amLODIPine (NORVASC) 5 MG tablet,  Take 1 tablet (5 mg) by mouth nightly Hold if SBP <100.  May take two 5 mg (10 mg) if SBP > 160., Disp: 30 tablet, Rfl: 2    brivaracetam (Briviact) 100 MG Tab tablet, Take 1 tablet (100 mg) by mouth every evening, Disp: 90 tablet, Rfl: 1    Brivaracetam (BRIVIACT) 50 MG Tab tablet, Take 1 tablet (50 mg) by mouth every morning, Disp: 90 tablet, Rfl: 1    Fungoid Tincture 2 % Solution, APPLY 1ML TWICE A DAY TO TOE NAIL, Disp: , Rfl:     guaiFENesin (ROBITUSSIN) 100 MG/5ML oral liquid, Take 10 mLs (200 mg) by mouth 3 (three) times daily as needed for Cough, Disp: 236 mL, Rfl: 1    levothyroxine (SYNTHROID) 88 MCG tablet, Take 1 tablet (88 mcg) by mouth every  morning, Disp: 90 tablet, Rfl: 3    lidocaine (LMX) 4 % cream, Apply topically 4 (four) times daily as needed (pain), Disp: 28 g, Rfl: 3    lisinopril (ZESTRIL) 10 MG tablet, TAKE 1 TABLET ORALLY ONCE A DAY EVERY MORNING FOR 30 DAY(S), Disp: 90 tablet, Rfl: 2    Magnesium 400 MG Tab, Take 1 tablet (400 mg) by mouth daily as needed (spasms), Disp: 90 tablet, Rfl: 1    mirtazapine (REMERON) 15 MG tablet, Take 1 tablet (15 mg) by mouth nightly, Disp: 90 tablet, Rfl: 0    Multiple Vitamins-Minerals (Centrum Silver 50+Women) Tab, Take 1 tablet by mouth daily, Disp: 100 tablet, Rfl: 3    Psyllium (Metamucil MultiHealth Fiber) 63 % Powder, Take 1 scoop. by mouth daily as needed (Constipation), Disp: 660 g, Rfl: 3    rosuvastatin (CRESTOR) 20 MG tablet, Take 1 tablet (20 mg) by mouth nightly, Disp: 90 tablet, Rfl: 3    urea (CARMOL) 40 % cream, APPLY 1 GRAM ONCE A DAY TO NAILS AND CALLUSES (Patient not taking: Reported on 04/10/2022), Disp: , Rfl:     PHYSICAL EXAM:   There were no vitals taken for this visit.   Wt Readings from Last 1 Encounters:   04/10/22 65.8 kg (145 lb)      Ht Readings from Last 1 Encounters:   04/10/22 1.6 m (5\' 3" )     Physical Exam  DIAGNOSTICS:     Lab Results   Component Value Date    WBC 5.36 07/02/2022    HGB 12.7 07/02/2022    HCT 39.3  07/02/2022    PLT 202 07/02/2022    CHOL 121 03/10/2021    TRIG 96 03/10/2021    HDL 35 (L) 03/10/2021    LDL 67 03/10/2021    ALT 21 01/22/2022    AST 36 01/22/2022    NA 132 (L) 07/02/2022    K 4.2 07/02/2022    CL 101 07/02/2022    CREAT 0.9 07/02/2022    BUN 30.0 (H) 07/02/2022    CO2 25 07/02/2022    TSH 1.17 01/22/2022    INR 1.0 03/10/2021    GLU 94 07/02/2022    HGBA1C 5.6 03/10/2021    ALKPHOS 66 01/22/2022     No results found.    ASSESSMENT and PLAN:   No problem-specific Assessment & Plan notes found for this encounter.      No orders of the defined types were placed in this encounter.    There are no diagnoses linked to this encounter.    Electronically Signed by Francisca December, NP

## 2022-10-03 DIAGNOSIS — F419 Anxiety disorder, unspecified: Secondary | ICD-10-CM | POA: Insufficient documentation

## 2022-10-03 DIAGNOSIS — M79671 Pain in right foot: Secondary | ICD-10-CM | POA: Insufficient documentation

## 2022-10-03 DIAGNOSIS — G8929 Other chronic pain: Secondary | ICD-10-CM | POA: Insufficient documentation

## 2022-10-03 DIAGNOSIS — M25511 Pain in right shoulder: Secondary | ICD-10-CM | POA: Insufficient documentation

## 2022-10-03 MED ORDER — JUBLIA 10 % EX SOLN
1.0000 | Freq: Every day | CUTANEOUS | 6 refills | Status: AC
Start: 2022-10-03 — End: ?

## 2022-10-03 NOTE — Assessment & Plan Note (Signed)
A/P: Continue Jublia 10% and will stop Fungoid Tincture.

## 2022-10-03 NOTE — Assessment & Plan Note (Signed)
A/P:  Discussed PT and suggested Aspercreme as needed.  She prefers to treat symptomatically with Aspercreme, acetaminophen and an occasional Aleve first.

## 2022-10-03 NOTE — Assessment & Plan Note (Signed)
A/P: No change mirtazapine.

## 2022-10-03 NOTE — Assessment & Plan Note (Signed)
A/P: Discussed options for evaluation, ie x-ray, ortho referral, but they decided she may need a new mattress.  Will start there and see how she does.  No change Aspercreme.

## 2022-10-03 NOTE — Assessment & Plan Note (Signed)
A/P:  F/u with Dr. Renella Cunas next week.

## 2022-10-03 NOTE — Assessment & Plan Note (Signed)
A/P:  Right foot cramping - suggested flexing foot when occurs.  Daughter said she will give mom a Gatorade once a day.  Enc water.

## 2022-10-03 NOTE — Assessment & Plan Note (Signed)
A/P: Continue dates with breakfast.

## 2022-10-03 NOTE — Assessment & Plan Note (Signed)
A/P: Continue to have available as needed.

## 2022-10-07 ENCOUNTER — Ambulatory Visit: Payer: Medicare Other | Attending: Neurology | Admitting: Neurology

## 2022-10-07 ENCOUNTER — Encounter: Payer: Self-pay | Admitting: Neurology

## 2022-10-07 DIAGNOSIS — R569 Unspecified convulsions: Secondary | ICD-10-CM | POA: Insufficient documentation

## 2022-10-07 MED ORDER — BRIVIACT 100 MG PO TABS
1.0000 | ORAL_TABLET | Freq: Every evening | ORAL | 1 refills | Status: DC
Start: 2022-10-07 — End: 2023-03-10

## 2022-10-07 MED ORDER — MAGNESIUM 400 MG PO TABS
1.0000 | ORAL_TABLET | Freq: Every day | ORAL | 1 refills | Status: DC | PRN
Start: 2022-10-07 — End: 2023-03-10

## 2022-10-07 MED ORDER — BRIVARACETAM 50 MG PO TABS
50.0000 mg | ORAL_TABLET | Freq: Every morning | ORAL | 1 refills | Status: DC
Start: 2022-10-07 — End: 2023-03-10

## 2022-10-07 NOTE — Progress Notes (Unsigned)
Subjective:       Please see detailed assessment and plan    Patient ID: [name] is a 89 y.o. 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    Current visit:     See detailed assessment / plan for the new developments since last visit and the respective new recommendations.     HPI  Background from Dr. Boone Master discharge note 02/2021 "89 y.o. 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 She was doing well after the trauma admit until ~10:30 today when at brunch it was difficult to get her attention even when calling out her name. Later she was found sitting with her head down in her hands and again it was difficult to get her attention. When she spoke there was word finding difficulty and some word salad. She appeared to have expressive and receptive aphasia for me. She denies headache. These symptoms are sudden onset, moderate intensity, without alleviating factors.    Her daughter also noted one episode of right hand shaking last  admission making it difficult for her to use a spoon. Her daughter has noted infrequent right facial twitching which started prior to the trauma admission."    Of note: Once patient was placed on briviact, her coomunication issues were gone.  For about 5 years before she was having right sided facial twitching,  which also stopped after starting Briviact.  Next CT scheduled 10/18/ 2022 with followup with neurosurgery..  Mother usually lives in [place] by herself and is here now to stay.  Emphasized no driving for at least 6 months per state law.    cvEEG 03/12/2021  IMPRESSION: Abnormal EEG due to:   1. Occasional left temporal intermittent rhythmic delta activity (TIRDA)   2. Left anterior to midtemporal focal slowing    MRI Brain 03/10/2021  IMPRESSION:   1. No acute infarct.   2. Evolution and decreased size of hematoma in the lateral left temporal lobe and smaller hemorrhagic contusion in the right parietal lobe with surrounding edema as detailed. No interval hemorrhage compared to the prior 03/04/2021 MRI  3. Stable left subdural hematoma and sequela of prior subarachnoid hemorrhage as detailed above.  4. Mild global volume loss, chronic small vessel ischemic changes and multiple chronic cerebellar infarcts.     CT head 03/21/2021  IMPRESSION:    1.  Interval resolution of parenchymal hemorrhage associated with a prior left MCA/temporal lobe infarct since prior noncontrast head CT.   2.  Improved subdural hematoma along the left tentorial leaflet and posterior cerebral falx with only trace residual.   3.  No new hemorrhage or evidence  of a new large vascular territory infarct.  4.  Otherwise similar chronic findings, as above, as on prior imaging.    Review of Systems  No other motor, sensory, vision, cognitive, speech, swallow, bowel, or bladder changes; no tremors, convulsions, seizure activity, or loss of consciousness episodes.  All other systems were reviewed and were negative.     Current Outpatient Medications  on File Prior to Visit   Medication Sig Dispense Refill    acetaminophen (TYLENOL) 650 MG CR tablet Take 1 tablet (650 mg) by mouth every 8 (eight) hours as needed for Pain 30 tablet 0    ALPRAZolam (Xanax) 0.25 MG tablet Take 1 tablet (0.25 mg) by mouth 2 (two) times daily as needed for Anxiety 30 tablet 0    amLODIPine (NORVASC) 5 MG tablet Take 1 tablet (5 mg) by mouth nightly Hold if SBP <100.  May take two 5 mg (10 mg) if SBP > 160. 30 tablet 2    brivaracetam (Briviact) 100 MG Tab tablet Take 1 tablet (100 mg) by mouth every evening 90 tablet 1    Brivaracetam (BRIVIACT) 50 MG Tab tablet Take 1 tablet (50 mg) by mouth every morning 90 tablet 1    Efinaconazole (Jublia) 10 % Solution Apply 1 .application topically daily To affected toenails for 48 weeks. 4 mL 6    guaiFENesin (ROBITUSSIN) 100 MG/5ML oral liquid Take 10 mLs (200 mg) by mouth 3 (three) times daily as needed for Cough 236 mL 1    levothyroxine (SYNTHROID) 88 MCG tablet Take 1 tablet (88 mcg) by mouth every morning 90 tablet 3    lidocaine (LMX) 4 % cream Apply topically 4 (four) times daily as needed (pain) 28 g 3    lisinopril (ZESTRIL) 10 MG tablet TAKE 1 TABLET ORALLY ONCE A DAY EVERY MORNING FOR 30 DAY(S) 90 tablet 2    Magnesium 400 MG Tab Take 1 tablet (400 mg) by mouth daily as needed (spasms) (Patient taking differently: Take 1 tablet (400 mg) by mouth every evening) 90 tablet 1    Multiple Vitamins-Minerals (Centrum Silver 50+Women) Tab Take 1 tablet by mouth daily 100 tablet 3    Psyllium (Metamucil MultiHealth Fiber) 63 % Powder Take 1 scoop. by mouth daily as needed (Constipation) 660 g 3    rosuvastatin (CRESTOR) 20 MG tablet Take 1 tablet (20 mg) by mouth nightly 90 tablet 3    mirtazapine (REMERON) 15 MG tablet Take 1 tablet (15 mg) by mouth nightly 90 tablet 0     No current facility-administered medications on file prior to visit.     Current/Home Medications    ACETAMINOPHEN (TYLENOL) 650 MG CR TABLET    Take 1 tablet (650 mg) by  mouth every 8 (eight) hours as needed for Pain    ALPRAZOLAM (XANAX) 0.25 MG TABLET    Take 1 tablet (0.25 mg) by mouth 2 (two) times daily as needed for Anxiety    AMLODIPINE (NORVASC) 5 MG TABLET    Take 1 tablet (5 mg) by mouth nightly Hold if SBP <100.  May take two 5 mg (10 mg) if SBP > 160.    BRIVARACETAM (BRIVIACT) 100 MG TAB TABLET    Take 1 tablet (100 mg) by mouth every evening    BRIVARACETAM (BRIVIACT) 50 MG TAB TABLET    Take 1 tablet (50 mg) by mouth every morning    EFINACONAZOLE (JUBLIA) 10 % SOLUTION    Apply 1 .application topically daily To affected toenails for  48 weeks.    GUAIFENESIN (ROBITUSSIN) 100 MG/5ML ORAL LIQUID    Take 10 mLs (200 mg) by mouth 3 (three) times daily as needed for Cough    LEVOTHYROXINE (SYNTHROID) 88 MCG TABLET    Take 1 tablet (88 mcg) by mouth every morning    LIDOCAINE (LMX) 4 % CREAM    Apply topically 4 (four) times daily as needed (pain)    LISINOPRIL (ZESTRIL) 10 MG TABLET    TAKE 1 TABLET ORALLY ONCE A DAY EVERY MORNING FOR 30 DAY(S)    MAGNESIUM 400 MG TAB    Take 1 tablet (400 mg) by mouth daily as needed (spasms)    MIRTAZAPINE (REMERON) 15 MG TABLET    Take 1 tablet (15 mg) by mouth nightly    MULTIPLE VITAMINS-MINERALS (CENTRUM SILVER 50+WOMEN) TAB    Take 1 tablet by mouth daily    PSYLLIUM (METAMUCIL MULTIHEALTH FIBER) 63 % POWDER    Take 1 scoop. by mouth daily as needed (Constipation)    ROSUVASTATIN (CRESTOR) 20 MG TABLET    Take 1 tablet (20 mg) by mouth nightly     Allergies   Allergen Reactions    Codeine Nausea And Vomiting      Patient Active Problem List    Diagnosis Date Noted    Chronic right shoulder pain [M25.511, G89.29] 10/03/2022    Right hand and foot pain [M79.641, M79.671] 10/03/2022    Anxiety [F41.9] 10/03/2022    Chronic low back pain [M54.50, G89.29] 06/27/2022    Fall [W19.XXXA] 02/01/2022    Diarrhea [R19.7] 02/01/2022    Syncope [R55] 11/25/2021    Onychomycosis [B35.1] 10/29/2021    Neck pain, bilateral posterior [M54.2]  09/11/2021    Loss of appetite [R63.0] 08/14/2021    Fatigue [R53.83] 08/14/2021    Seizures [R56.9] 08/14/2021    Traumatic subdural hemorrhage with loss of consciousness of unspecified duration, initial encounter [S06.5X9A] 08/06/2021    Situational depression [F43.21] 07/13/2021    Post-COVID syndrome resolved [Z86.16] 07/13/2021    Toe pain, left [M79.675] 05/07/2021    Constipation [K59.00] 05/07/2021    Hypertension [I10] 05/07/2021    Hyperlipidemia [E78.5] 05/07/2021    Palpitations [R00.2] 04/21/2021    S/p TAVR (transcatheter aortic valve replacement), 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] 04/18/2021    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] 04/18/2021    Hypothyroidism [E03.9] 04/18/2021    Osteoporosis [M81.0] 04/18/2021    Recurrent UTI [N39.0] 04/18/2021    Gait abnormality [R26.9] 04/18/2021    Cerebral edema [G93.6] 03/10/2021    Left temporal and right parietal hemorrhagic contusions on MRI 03/04/21 [I61.9] 03/10/2021    Subdural hematoma [S06.5XAA] 03/10/2021    Hyponatremia [E87.1] 03/10/2021    Aphasia [R47.01] 03/09/2021    Nontraumatic acute subdural hemorrhage [I62.01] 03/03/2021     Past Surgical History:   Procedure Laterality Date    AORTIC VALVE REPLACEMENT      first AVR required sternotomy, >10 years ago per patient    BACK SURGERY      HYSTERECTOMY      REPLACEMENT TOTAL KNEE Bilateral     left total, right "half knee" replacement    TRANSCATHETER AORTIC VALVE REPLACEMENT  2016    5-6 years ago per patient    WRIST SURGERY Left 2017     Social History     Socioeconomic History    Marital status: Widowed   Tobacco Use    Smoking status: Never    Smokeless tobacco: Never   Vaping Use  Vaping Use: Never used   Substance and Sexual Activity    Alcohol use: Not Currently     Alcohol/week: 3.0 standard drinks of alcohol     Types: 3 Glasses of wine per week     Comment: 1-2 a week    Drug use: Never    Sexual activity: Not Currently     Social Determinants of Health     Financial Resource Strain: Low Risk   (09/29/2022)    Overall Financial Resource Strain (CARDIA)     Difficulty of Paying Living Expenses: Not hard at all   Food Insecurity: No Food Insecurity (09/29/2022)    Hunger Vital Sign     Worried About Running Out of Food in the Last Year: Never true     Ran Out of Food in the Last Year: Never true   Transportation Needs: No Transportation Needs (09/29/2022)    PRAPARE - Armed forces logistics/support/administrative officer (Medical): No     Lack of Transportation (Non-Medical): No   Physical Activity: Inactive (09/29/2022)    Exercise Vital Sign     Days of Exercise per Week: 0 days     Minutes of Exercise per Session: 20 min   Stress: No Stress Concern Present (09/29/2022)    [place]     Feeling of Stress : Not at all   Social Connections: Moderately Integrated (09/29/2022)    Social Connection and Isolation Panel [NHANES]     Frequency of Communication with Friends and Family: More than three times a week     Frequency of Social Gatherings with Friends and Family: More than three times a week     Attends Religious Services: 1 to 4 times per year     Active Member of [employer] or Organizations: Yes     Attends Archivist Meetings: More than 4 times per year     Marital Status: Widowed   Intimate Partner Violence: Not At Risk (09/29/2022)    Humiliation, Afraid, Rape, and Kick questionnaire     Fear of Current or Ex-Partner: No     Emotionally Abused: No     Physically Abused: No     Sexually Abused: No   Housing Stability: Low Risk  (09/29/2022)    Housing Stability Vital Sign     Unable to Pay for Housing in the Last Year: No     Number of Places Lived in the Last Year: 2     Unstable Housing in the Last Year: No     Family History   Problem Relation Age of Onset    Stroke Mother     Anuerysm Mother     Cancer Father     Heart disease Sister     Heart disease Brother     Intracerebral hemorrhage Neg Hx      She  has a past medical history of Bleeding in  head following injury with loss of consciousness, Convulsions, Hyperlipidemia, Hypertension, Hypothyroidism, and Irregular heartbeat.        Objective:      Physical Exam Neurologic Exam    Vital Signs:  Reviewed    General: Well developed and well nourished. No acute distress. Cooperative with the exam  ENT: Normal oral mucosa, no ear or nose discharge  Neck: Symmetric, no deformities  CV: RRR  Resp: No audible wheezing, normal work of breathing  Abd: Soft, nondistended  Skin: Intact, extremities normal in color  Psych: Affect is normal, good insight    Mental Status: The patient is awake, alert and oriented to person, place, and time.  Affect is normal  Fund of knowledge appropriate  Recent and remote memory are intact   Attention span and concentration appear normal.  Language function is normal. There is no evidence of aphasia in conversational speech.    Cranial nerves:   -CN II: Visual fields full to bedside confrontation   -CN III, IV, VI: Pupils equal, round, and reactive to light; extraocular movements intact; no ptosis              -CN V: Facial sensation intact in V1 through V3 distributions   -CN VII: Face symmetric   -CN VIII: Hearing intact to conversational speech   -CN IX, X: Palate elevates symmetrically; normal phonation   -CN XI: Symmetric full strength of sternocleidomastoid and trapezius muscles   -CN XII: Tongue protrudes midline    Motor: Muscle tone normal without spasticity or flaccidity. No atrophy.  No pronator drift.     UEs:   Deltoid Bicep Tricep WE WF Grip IO   Right 5 5 5 5 5 5 5    Left 5 5 5 5 5 5 5      LEs   HF HE KF KE PF DF   Right 5 5 5 5 5 5    Left 5 5 5 5 5 5      Sensory:   Light touch intact.  Temperature intact.  Vibration intact.    Reflexes:      B T BR P A   Right 2 2 2 2 2    Left 2 2 2 2 2      Plantars:    Coordination: FTN intact, no truncal ataxia. RAMs intact. 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  Patient has been having eye twitching that has been coming and going.  Initially the Briviact helped with the eye twitching then eye twitching returned then went away again.  When asked about PT for gait disturbance, it was noted that patient did not start PT.  Patient went to [place] for a lengthy visit and while her daughter noted that the patient's balance continues to be poor, the daughter noted that patient's shoes are very large and slide around on her.  Daughter reported that she will get the patient thicker socks.  Daughter reports that the patient is pretty  steady using her walker, however, does not use her walker at home and uses a wheelchair when out for longer trips.    10/10/21 visit:  reduced briviact helped a lot. Will add magnesium 400. No seizures or auras since last visit. No ER visits. Compliant with medications. Tolerating well.     04/10/22 visit: No seizures or auras since last visit. No ER visits. Compliant with medications. Tolerating  well. Compliance is good. Again readdressed/ discussed the need for AEDs continuation/ duration of medication. Refills sent to the requested pharmacy. Will follow up 6 monthly intervals. She will approach me through my chart if any concerns or questions in the mean time.    Time spent today (including but not limited to EEG review, neuroimaging review, previous clinical notes reviewed, discussion with the patient regarding the plan and documentation)  = 40 minutes    More than 50% of this office visit was spent counseling the patient on one or more of the following:   Disease state - discussion about the medical condition, diagnostic and treatment options   Medications - indications and side effects   Lifestyle issues as appropriate.           Recommend:   Will add magnesium 400.   Continue Reduced Briviact to 50 mg in Am and 100 mg in PM refills sent to pharmacy    Continue to participate socially, physically and cognitively as much he can to improve brain health  Refills sent the pharmacy as requested:   Continue Seizure precautions, avoid seizure triggers like missing seizure meds, lack of sleep, too much physical/cognitive stress, driving restrictions per state law ( in [place], be aware not to drive for six months from last seizure with LOC/LOA).   Advised patient to use Mychart for any questions in the interm    Return to clinic in 6 months : you can schedule the appt by calling [phone]               Plan:      No orders of the defined types were placed in this encounter.    All relevant and clinical information was transcribed by me, Kathrine Cords. Jessee Avers, NP, acting as a scribe for Dr. Magdalene Molly.    Agree     Additional notes and data scanned including patient questionnaire which may contain pertinent information to visit. Patient can follow up sooner if needed. In the meantime, patient will contact the office with any questions or concerns.     Total of 40 minutes were spent on the day of service including  face-to-face time with patient, coordinating care, record review and documentation, explaining the natural history of seizures, types of seizures, triggers for seizures.  Need for the necessary testing.  EEG and MRI.  Need for a seizure medication compliance for medications.  Seizure precautions including avoid driving per [place].  6 months from the last seizure.  Side effects of seizure medications.  Avoiding triggers for seizures.  Backup plan, and the time of partial seizure and convulsive seizures.  Including going to the emergency room.  Taking emergency rescue medications.           Acquanetta Belling, MD. [place]    Director, [facility]  Assistant Professor, [facility]  Board Certified,Neurology  Board Certified, Clinical Neurophysiology    [email or web address]      This note was generated by the Epic EMR system/ Dragon speech recognition and may contain inherent  errors or omissions not intended by the user. Grammatical errors, random word insertions, deletions, pronoun errors and incomplete sentences are occasional consequences of this technology due to software limitations. Not all errors are caught or corrected.Although every attempt is made to root out erroneus and incomplete transcription, the note may still not fully represent the intent or opinion of the author. If there are questions or concerns about the content of this note or information contained within the body of this dictation they should be addressed directly with the author for clarification.*

## 2022-10-08 ENCOUNTER — Encounter: Payer: Self-pay | Admitting: Neurology

## 2022-11-09 ENCOUNTER — Other Ambulatory Visit (INDEPENDENT_AMBULATORY_CARE_PROVIDER_SITE_OTHER): Payer: Self-pay | Admitting: Nurse Practitioner

## 2022-11-09 MED ORDER — AMLODIPINE BESYLATE 5 MG PO TABS
5.0000 mg | ORAL_TABLET | Freq: Every evening | ORAL | 2 refills | Status: DC
Start: 2022-11-09 — End: 2023-03-23

## 2022-11-09 NOTE — Telephone Encounter (Signed)
[facility] fax refill request

## 2022-11-30 ENCOUNTER — Other Ambulatory Visit (INDEPENDENT_AMBULATORY_CARE_PROVIDER_SITE_OTHER): Payer: Self-pay | Admitting: Nurse Practitioner

## 2022-11-30 MED ORDER — MIRTAZAPINE 15 MG PO TABS
15.0000 mg | ORAL_TABLET | Freq: Every evening | ORAL | 0 refills | Status: DC
Start: 2022-11-30 — End: 2023-03-01

## 2022-11-30 NOTE — Telephone Encounter (Signed)
Please review refill request 

## 2023-01-01 ENCOUNTER — Encounter (INDEPENDENT_AMBULATORY_CARE_PROVIDER_SITE_OTHER): Payer: Medicare Other | Admitting: Nurse Practitioner

## 2023-01-06 ENCOUNTER — Encounter (INDEPENDENT_AMBULATORY_CARE_PROVIDER_SITE_OTHER): Payer: Medicare Other | Admitting: Nurse Practitioner

## 2023-01-08 ENCOUNTER — Encounter (INDEPENDENT_AMBULATORY_CARE_PROVIDER_SITE_OTHER): Payer: Medicare Other | Admitting: Nurse Practitioner

## 2023-01-29 ENCOUNTER — Ambulatory Visit: Payer: Medicare Other | Admitting: Nurse Practitioner

## 2023-01-29 ENCOUNTER — Encounter: Payer: Self-pay | Admitting: Neurology

## 2023-01-29 VITALS — BP 124/66 | HR 86 | Temp 98.6°F | Resp 20

## 2023-01-29 DIAGNOSIS — M79672 Pain in left foot: Secondary | ICD-10-CM | POA: Insufficient documentation

## 2023-01-29 DIAGNOSIS — M79671 Pain in right foot: Secondary | ICD-10-CM | POA: Insufficient documentation

## 2023-01-29 DIAGNOSIS — G8929 Other chronic pain: Secondary | ICD-10-CM

## 2023-01-29 DIAGNOSIS — E039 Hypothyroidism, unspecified: Secondary | ICD-10-CM

## 2023-01-29 DIAGNOSIS — R569 Unspecified convulsions: Secondary | ICD-10-CM

## 2023-01-29 DIAGNOSIS — R600 Localized edema: Secondary | ICD-10-CM

## 2023-01-29 DIAGNOSIS — I10 Essential (primary) hypertension: Secondary | ICD-10-CM

## 2023-01-29 DIAGNOSIS — M545 Low back pain, unspecified: Secondary | ICD-10-CM

## 2023-01-29 DIAGNOSIS — E43 Unspecified severe protein-calorie malnutrition: Secondary | ICD-10-CM

## 2023-01-29 DIAGNOSIS — E785 Hyperlipidemia, unspecified: Secondary | ICD-10-CM

## 2023-01-29 NOTE — Assessment & Plan Note (Signed)
A/P:  -TSH

## 2023-01-29 NOTE — Assessment & Plan Note (Addendum)
A/P: If SBP remains >150 consistently, considering going back to amlodipine 10 mg at night.  Will switch to another antihypertensive if that causes consistent LE edema.  Check CMP, CBC with diff, lipid panel.

## 2023-01-29 NOTE — Assessment & Plan Note (Signed)
A/P: Check lipid panel

## 2023-01-29 NOTE — Progress Notes (Signed)
Date: 01/29/2023    Patient Name: [name]    Patient was seen in their home (POS 12) in lieu of an office visit for Susan following reason:   Requires use of an assistive device in order to ambulate due to ambulatory dysfunction.        Code Status: DNAR    HPI:   Staci Doonan is a 89 y.o. F that I am here to address chronic problems.  Her daughter, [name], was present and supplemented her mother's information.      Ms. Magyar had several concerns:    Eye & facial twitching on right that started when she was recently in [place].  Taking magnesium.  Also, new right sided facial droop, slight.  No slurred speech, no musce weakness., no change in gait.  Has some drooling right side of mouth.  Dtr asked if this could be UTI.    Was using Roc cream which caused puffiness under eyes. Switched back to Roc eye cream and resolved.    Has foot pain during night on ball of foot and toes.  Alternating sides and sometimes both at once.  Achy and sometimes cramping.  Saw podiatrist in [place] who recommended using diclofenac 1% gel.  Pain subsides but doesn't work all night.       LBP persists.  Denied radiation, numbness and tingling.     Has noticed more swelling feet and ankles.  [name] stated mother is not eating (esp protein) or drinking as much.  Trying to keep her legs elevated and resolves when she does.  Not walking as much 2/2 heat.    BP has been running a little higher at times.  Was 185/81 on Sunday, gave mother amlodipine 5 mg 2 tabs and SBP 124.  SBP typically 120-140s.    Problem   Foot Pain, Bilateral   Pedal Edema   Chronic Low Back Pain   Seizures   Hypertension           Hyperlipidemia   Hypothyroidism     Review of Systems   Constitutional:  Positive for appetite change (Not eatng as much, feels she needs to lose weight). Negative for activity change.   Respiratory:  Negative for cough and shortness of breath.    Cardiovascular:  Positive for leg swelling. Negative for chest pain and  palpitations.   Gastrointestinal:  Negative for constipation, diarrhea, nausea and vomiting.   Musculoskeletal:  Positive for back pain and myalgias.   Neurological:  Negative for dizziness, seizures, weakness and headaches.   Psychiatric/Behavioral:  Positive for sleep disturbance (2/2 foot and back pain).      Past Medical History:   Diagnosis Date    Bleeding in head following injury with loss of consciousness     a year ago    Convulsions     Hyperlipidemia     Hypertension     Hypothyroidism     Irregular heartbeat      Past Surgical History:   Procedure Laterality Date    AORTIC VALVE REPLACEMENT      first AVR required sternotomy, >10 years ago per patient    BACK SURGERY      HYSTERECTOMY      REPLACEMENT TOTAL KNEE Bilateral     left total, right "half knee" replacement    TRANSCATHETER AORTIC VALVE REPLACEMENT  2016    5-6 years ago per patient    WRIST SURGERY Left 2017     Family History   Problem  Relation Age of Onset    Stroke Mother     Anuerysm Mother     Cancer Father     Heart disease Sister     Heart disease Brother     Intracerebral hemorrhage Neg Hx      Social History     Tobacco Use    Smoking status: Never    Smokeless tobacco: Never   Vaping Use    Vaping status: Never Used   Substance Use Topics    Alcohol use: Not Currently     Alcohol/week: 3.0 standard drinks of alcohol     Types: 3 Glasses of wine per week     Comment: 1-2 a week    Drug use: Never     Allergies   Allergen Reactions    Codeine Nausea And Vomiting       MEDICATIONS:     Current Outpatient Medications:     acetaminophen (TYLENOL) 650 MG CR tablet, Take 1 tablet (650 mg) by mouth every 8 (eight) hours as needed for Pain, Disp: 30 tablet, Rfl: 0    ALPRAZolam (Xanax) 0.25 MG tablet, Take 1 tablet (0.25 mg) by mouth 2 (two) times daily as needed for Anxiety, Disp: 30 tablet, Rfl: 0    amLODIPine (NORVASC) 5 MG tablet, Take 1 tablet (5 mg) by mouth nightly Hold if SBP <100.  May take two 5 mg (10 mg) if SBP > 160., Disp: 30  tablet, Rfl: 2    brivaracetam (Briviact) 100 MG Tab tablet, Take 1 tablet (100 mg) by mouth every evening, Disp: 90 tablet, Rfl: 1    Brivaracetam (BRIVIACT) 50 MG Tab tablet, Take 1 tablet (50 mg) by mouth every morning, Disp: 90 tablet, Rfl: 1    Efinaconazole (Jublia) 10 % Solution, Apply 1 .application topically daily To affected toenails for 48 weeks., Disp: 4 mL, Rfl: 6    levothyroxine (SYNTHROID) 88 MCG tablet, Take 1 tablet (88 mcg) by mouth every morning, Disp: 90 tablet, Rfl: 3    lidocaine (LMX) 4 % cream, Apply topically 4 (four) times daily as needed (pain), Disp: 28 g, Rfl: 3    lisinopril (ZESTRIL) 10 MG tablet, TAKE 1 TABLET ORALLY ONCE A DAY EVERY MORNING FOR 30 DAY(S), Disp: 90 tablet, Rfl: 2    Magnesium 400 MG Tab, Take 1 tablet (400 mg) by mouth daily as needed (spasms), Disp: 90 tablet, Rfl: 1    mirtazapine (REMERON) 15 MG tablet, Take 1 tablet (15 mg) by mouth nightly, Disp: 90 tablet, Rfl: 0    Multiple Vitamins-Minerals (Centrum Silver 50+Women) Tab, Take 1 tablet by mouth daily, Disp: 100 tablet, Rfl: 3    rosuvastatin (CRESTOR) 20 MG tablet, Take 1 tablet (20 mg) by mouth nightly, Disp: 90 tablet, Rfl: 3    PHYSICAL EXAM:   BP 124/66   Pulse 86   Temp 98.6 F (37 C)   Resp 20   SpO2 98%    Wt Readings from Last 1 Encounters:   10/07/22 67.6 kg (149 lb)      Ht Readings from Last 1 Encounters:   10/07/22 1.575 m (5\' 2" )     Physical Exam  Constitutional:       General: She is not in acute distress.     Appearance: She is normal weight. She is not ill-appearing or toxic-appearing.   Cardiovascular:      Rate and Rhythm: Normal rate and regular rhythm.      Pulses: Normal pulses.  Heart sounds: Murmur (3-4/6 SEM LSB) heard.   Pulmonary:      Effort: Pulmonary effort is normal.      Breath sounds: Normal breath sounds.   Abdominal:      General: Bowel sounds are normal. There is no distension.      Palpations: Abdomen is soft.      Tenderness: There is no abdominal tenderness.    Musculoskeletal:         General: Normal range of motion.      Right lower leg: Edema present.      Left lower leg: Edema present.      Comments: 1-2+ pedal edema, rt > lt.  Exam of feet/toes nml.  UE extremity strength 4/5 bil.  Gait steady.   Skin:     General: Skin is warm and dry.   Neurological:      Mental Status: She is alert and oriented to person, place, and time. Mental status is at baseline.      Comments: Right facial twitching frequently through visit.  Slight facial droop with downward turn corner of mouth.   Psychiatric:         Mood and Affect: Mood normal.         Behavior: Behavior normal.       DIAGNOSTICS:     Lab Results   Component Value Date    WBC 5.36 07/02/2022    HGB 12.7 07/02/2022    HCT 39.3 07/02/2022    PLT 202 07/02/2022    CHOL 121 03/10/2021    TRIG 96 03/10/2021    HDL 35 (L) 03/10/2021    LDL 67 03/10/2021    ALT 21 01/22/2022    AST 36 01/22/2022    NA 132 (L) 07/02/2022    K 4.2 07/02/2022    CL 101 07/02/2022    CREAT 0.9 07/02/2022    BUN 30.0 (H) 07/02/2022    CO2 25 07/02/2022    TSH 1.17 01/22/2022    INR 1.0 03/10/2021    GLU 94 07/02/2022    HGBA1C 5.6 03/10/2021    ALKPHOS 66 01/22/2022     No results found.    ASSESSMENT and PLAN:   Foot pain, bilateral  A/P: Could be 2/2 dehydration, electrolyte imbalance and/or ill-fitting shoes.  Continue using diclofenac 1% gel and makes sure she is eating balance diet and drinking fluids.  Likes Gatorade which she drinks daily. Try OTC topical pain relieve such as Biofreeze or Aspercreme and try taking acetaminophen at bedtime.     Seizures  A/P: Facial twitching could be r/t eye twitching she was having when she first saw neurologist.  Right sided facial droop could be some kind of palsy. Taking more magnesium.  Advised to contact neurologist.  Check magnesium level, Vit D, UA with reflex to micro.    Chronic low back pain  A/P: Suggested getting a new mattress which [name] has suggested to mom before.    Pedal edema  A/P:   Could be caused by numerous things - eg, amlodipine, Susan heat, lack of exercise, inadequate protein intake, legs more dependent.  Maintain good protein and fluid intake.  Keep legs elevated when sitting.  Resume walking, weather permitting.  If persists or worsens, consider replacing amlodipine with another BP med.    Hypertension  A/P: If SBP remains >150 consistently, considering going back to amlodipine 10 mg at night.  Will switch to another antihypertensive if that causes consistent LE edema.  Check CMP,  CBC with diff, lipid panel.    Hypothyroidism  A/P: TSH    Hyperlipidemia  A/P: Check lipid panel.      No orders of Susan defined types were placed in this encounter.    Donnell was seen today for follow-up, back pain, hypertension, foot swelling, foot pain and seizures.    Diagnoses and all orders for this visit:    Foot pain, bilateral    Seizures    Chronic bilateral low back pain without sciatica    Pedal edema    Primary hypertension    Hypothyroidism, unspecified type    Hyperlipidemia, unspecified hyperlipidemia type        Electronically Signed by Zetta Bills, NP

## 2023-01-29 NOTE — Assessment & Plan Note (Addendum)
A/P:  Could be caused by numerous things - eg, amlodipine, the heat, lack of exercise, inadequate protein intake, legs more dependent.  Maintain good protein and fluid intake.  Keep legs elevated when sitting.  Resume walking, weather permitting.  If persists or worsens, consider replacing amlodipine with another BP med.

## 2023-01-29 NOTE — Assessment & Plan Note (Signed)
A/P: Suggested getting a new mattress which [name] has suggested to mom before.

## 2023-01-29 NOTE — Assessment & Plan Note (Addendum)
A/P: Facial twitching could be r/t eye twitching she was having when she first saw neurologist.  Right sided facial droop could be some kind of palsy. Taking more magnesium.  Advised to contact neurologist.  Check magnesium level, Vit D, UA with reflex to micro.

## 2023-01-29 NOTE — Assessment & Plan Note (Signed)
A/P: Could be 2/2 dehydration, electrolyte imbalance and/or ill-fitting shoes.  Continue using diclofenac 1% gel and makes sure she is eating balance diet and drinking fluids.  Likes Gatorade which she drinks daily. Try OTC topical pain relieve such as Biofreeze or Aspercreme and try taking acetaminophen at bedtime.

## 2023-02-01 ENCOUNTER — Ambulatory Visit: Payer: Medicare Other | Attending: Nurse Practitioner

## 2023-02-01 ENCOUNTER — Telehealth (INDEPENDENT_AMBULATORY_CARE_PROVIDER_SITE_OTHER): Payer: Self-pay | Admitting: Nurse Practitioner

## 2023-02-01 DIAGNOSIS — E039 Hypothyroidism, unspecified: Secondary | ICD-10-CM

## 2023-02-01 DIAGNOSIS — R569 Unspecified convulsions: Secondary | ICD-10-CM | POA: Insufficient documentation

## 2023-02-01 DIAGNOSIS — E43 Unspecified severe protein-calorie malnutrition: Secondary | ICD-10-CM

## 2023-02-01 DIAGNOSIS — I10 Essential (primary) hypertension: Secondary | ICD-10-CM | POA: Insufficient documentation

## 2023-02-01 DIAGNOSIS — E785 Hyperlipidemia, unspecified: Secondary | ICD-10-CM | POA: Insufficient documentation

## 2023-02-01 LAB — LAB USE ONLY - CBC WITH DIFFERENTIAL
Absolute Basophils: 0.02 10*3/uL (ref 0.00–0.08)
Absolute Eosinophils: 0.06 10*3/uL (ref 0.00–0.44)
Absolute Immature Granulocytes: 0.01 10*3/uL (ref 0.00–0.07)
Absolute Lymphocytes: 0.87 10*3/uL (ref 0.42–3.22)
Absolute Monocytes: 0.43 10*3/uL (ref 0.21–0.85)
Absolute Neutrophils: 2.42 10*3/uL (ref 1.10–6.33)
Absolute nRBC: 0 10*3/uL (ref <=0.00)
Basophils %: 0.5 %
Eosinophils %: 1.6 %
Hematocrit: 36.9 % (ref 34.7–43.7)
Hemoglobin: 12.5 g/dL (ref 11.4–14.8)
Immature Granulocytes %: 0.3 %
Lymphocytes %: 22.8 %
MCH: 32.4 pg (ref 25.1–33.5)
MCHC: 33.9 g/dL (ref 31.5–35.8)
MCV: 95.6 fL (ref 78.0–96.0)
MPV: 9.7 fL (ref 8.9–12.5)
Monocytes %: 11.3 %
Neutrophils %: 63.5 %
Platelet Count: 161 10*3/uL (ref 142–346)
Preliminary Absolute Neutrophil Count: 2.42 10*3/uL (ref 1.10–6.33)
RBC: 3.86 10*6/uL — ABNORMAL LOW (ref 3.90–5.10)
RDW: 13 % (ref 11–15)
WBC: 3.81 10*3/uL (ref 3.10–9.50)
nRBC %: 0 /100 WBC (ref <=0.0)

## 2023-02-01 LAB — COMPREHENSIVE METABOLIC PANEL
ALT: 26 U/L (ref 0–55)
AST (SGOT): 28 U/L (ref 5–41)
Albumin/Globulin Ratio: 1.2 (ref 0.9–2.2)
Albumin: 3.7 g/dL (ref 3.5–5.0)
Alkaline Phosphatase: 64 U/L (ref 37–117)
Anion Gap: 6 (ref 5.0–15.0)
BUN: 25 mg/dL — ABNORMAL HIGH (ref 7–21)
Bilirubin, Total: 0.4 mg/dL (ref 0.2–1.2)
CO2: 24 mEq/L (ref 17–29)
Calcium: 9.1 mg/dL (ref 7.9–10.2)
Chloride: 103 mEq/L (ref 99–111)
Creatinine: 1 mg/dL (ref 0.4–1.0)
GFR: 51.9 mL/min/{1.73_m2} — ABNORMAL LOW (ref >=60.0)
Globulin: 3 g/dL (ref 2.0–3.6)
Glucose: 128 mg/dL — ABNORMAL HIGH (ref 70–100)
Hemolysis Index: 14 Index
Potassium: 4.7 mEq/L (ref 3.5–5.3)
Protein, Total: 6.7 g/dL (ref 6.0–8.3)
Sodium: 133 mEq/L — ABNORMAL LOW (ref 135–145)

## 2023-02-01 LAB — LAB USE ONLY - URINALYSIS WITH REFLEX TO MICROSCOPIC EXAM AND CULTURE
Urine Bilirubin: NEGATIVE
Urine Blood: NEGATIVE
Urine Glucose: NEGATIVE
Urine Ketones: NEGATIVE mg/dL
Urine Nitrite: NEGATIVE
Urine Protein: NEGATIVE
Urine Specific Gravity: 1.013 (ref 1.001–1.035)
Urine Urobilinogen: NORMAL mg/dL (ref 0.2–2.0)
Urine pH: 5.5 (ref 5.0–8.0)

## 2023-02-01 LAB — LIPID PANEL
Cholesterol / HDL Ratio: 3.1 Index
Cholesterol: 137 mg/dL (ref <=199)
HDL: 44 mg/dL (ref >=40)
LDL Calculated: 75 mg/dL (ref 0–129)
Triglycerides: 92 mg/dL (ref 34–149)
VLDL Calculated: 18 mg/dL (ref 10–40)

## 2023-02-01 LAB — LAB USE ONLY - URINE GRAY CULTURE HOLD TUBE

## 2023-02-01 LAB — VITAMIN D, 25 OH, TOTAL: Vitamin D 25-OH, Total: 40 ng/mL (ref 30–100)

## 2023-02-01 LAB — TSH: TSH: 0.38 u[IU]/mL (ref 0.35–4.94)

## 2023-02-01 LAB — MAGNESIUM: Magnesium: 2.3 mg/dL (ref 1.6–2.6)

## 2023-02-01 NOTE — Telephone Encounter (Signed)
-----   Message from Zetta Bills, NP sent at 01/29/2023 11:14 PM EDT -----  Lorain Childes, lab orders in but dtr will take mom to lab.

## 2023-02-01 NOTE — Telephone Encounter (Signed)
Writer called inform daughter that lab orders placed by PCP.  Daughter stated that she will take patient to the nearest lab.

## 2023-02-02 ENCOUNTER — Other Ambulatory Visit (INDEPENDENT_AMBULATORY_CARE_PROVIDER_SITE_OTHER): Payer: Self-pay | Admitting: Nurse Practitioner

## 2023-02-02 ENCOUNTER — Encounter (INDEPENDENT_AMBULATORY_CARE_PROVIDER_SITE_OTHER): Payer: Self-pay | Admitting: Nurse Practitioner

## 2023-02-02 MED ORDER — LEVOTHYROXINE SODIUM 50 MCG PO TABS
50.0000 ug | ORAL_TABLET | Freq: Every day | ORAL | 3 refills | Status: DC
Start: 2023-02-02 — End: 2023-02-15

## 2023-02-02 NOTE — Progress Notes (Signed)
Labs reviewed. Advised daughter her mother needs to drink more fluids and sodium is a little low.  Suggest Gatorade or Pedialyte.  CBC, Magnesium, Vit D and lipid panel are really good.  TSH is low.  Will reduce levothyroxine to 50 mcg daily and repeat TSH in 2-3 months,    May have a UTI.  Waiting for sensitivities.

## 2023-02-05 IMAGING — CR HIP BIL 5 VWS W/PELVIS
2 series · 5 of 5 positions shown · non-contrast
Comparison: One view pelvis 11/05/15

HISTORY: Right hip pain.
TECHNIQUE: Five-view series pelvis and both hips.

[ap (1 of 2)]
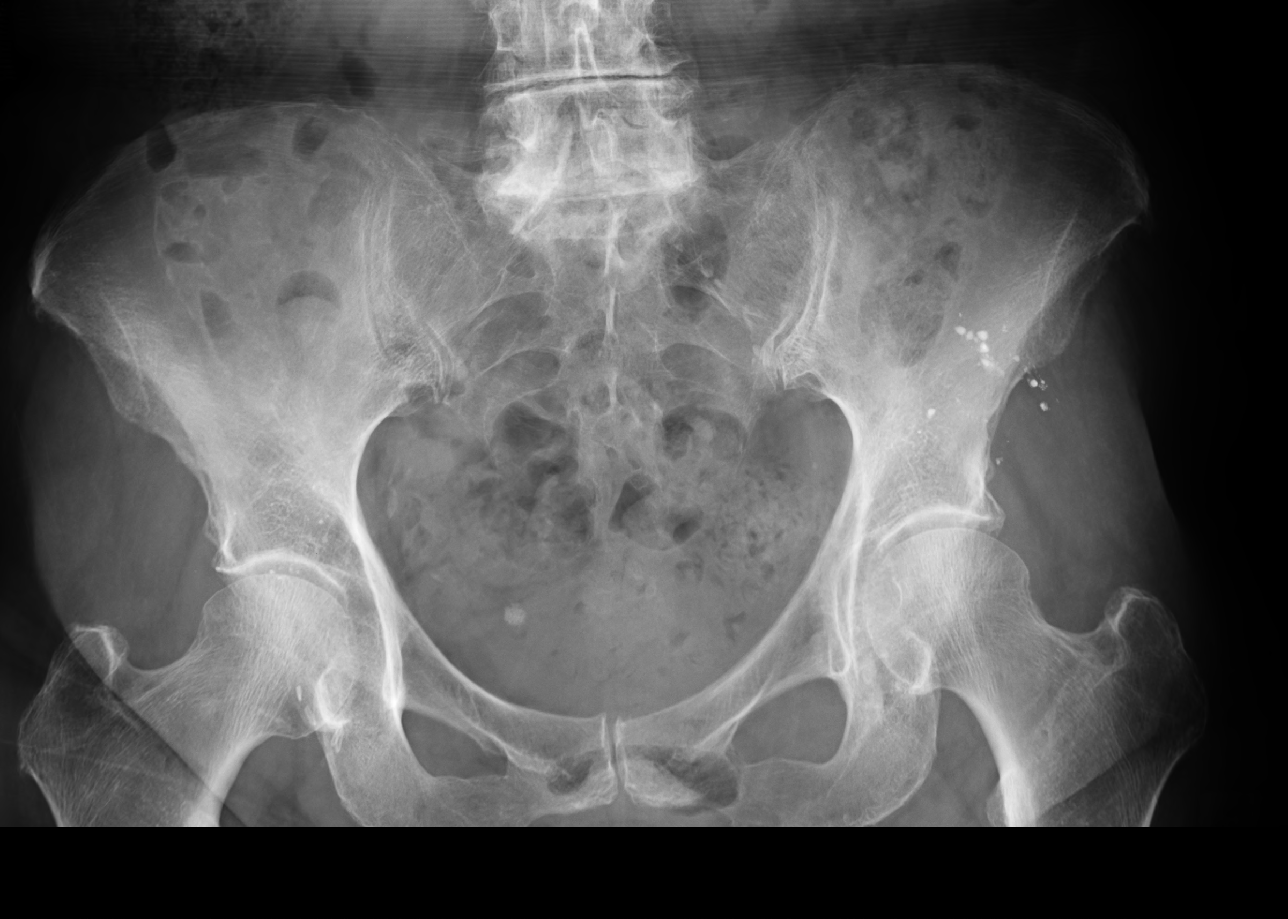

[Series 2: ap · 0.17mm/px · 4 of 4 slices shown (2 of 2)]
[im 1/4]
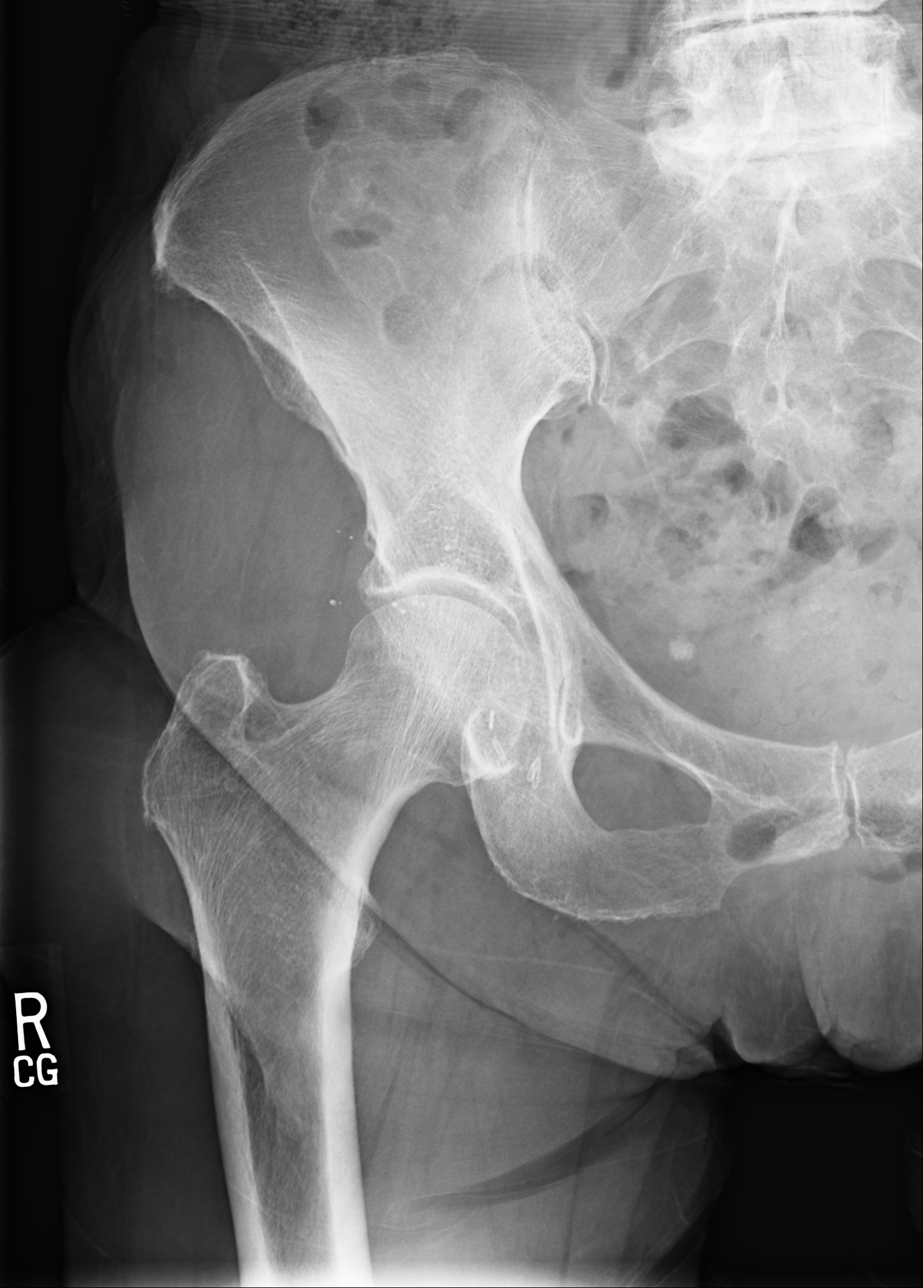
[im 2/4]
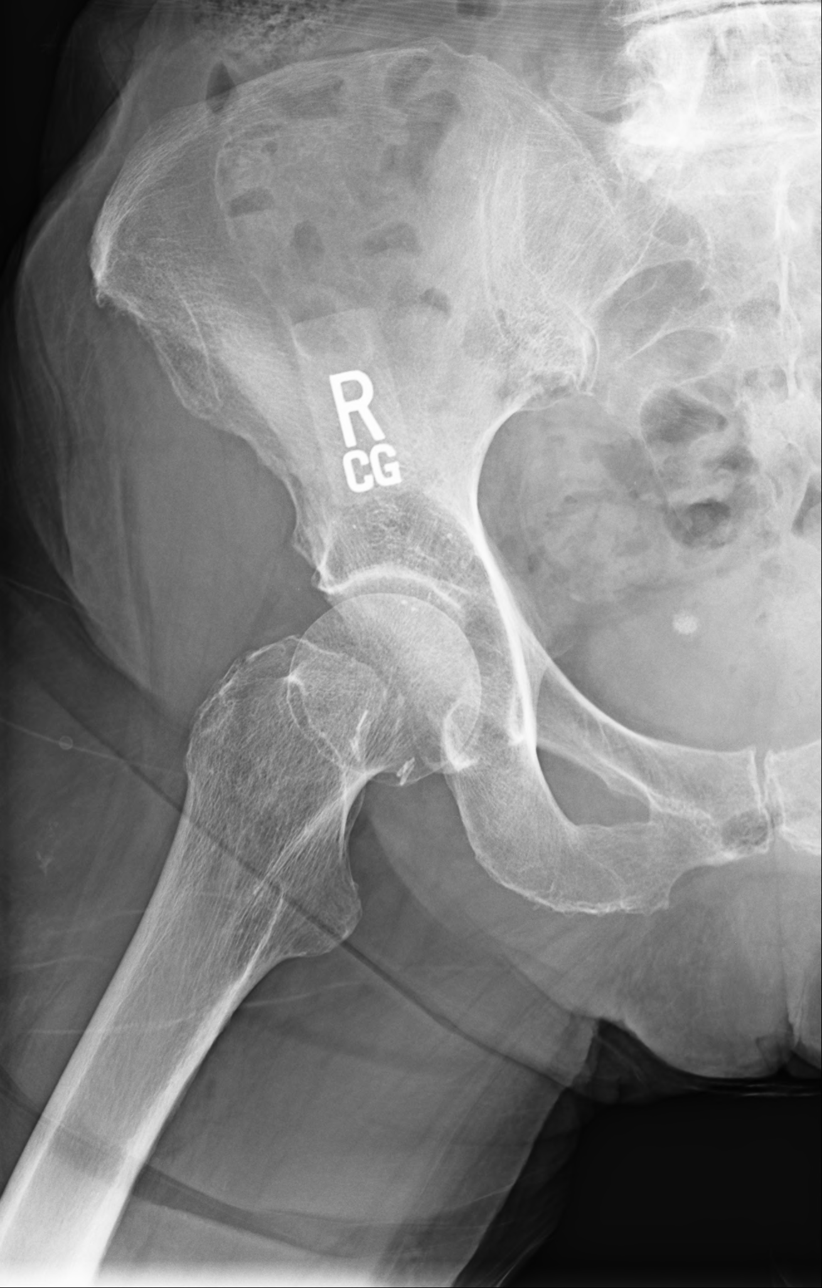
[im 3/4]
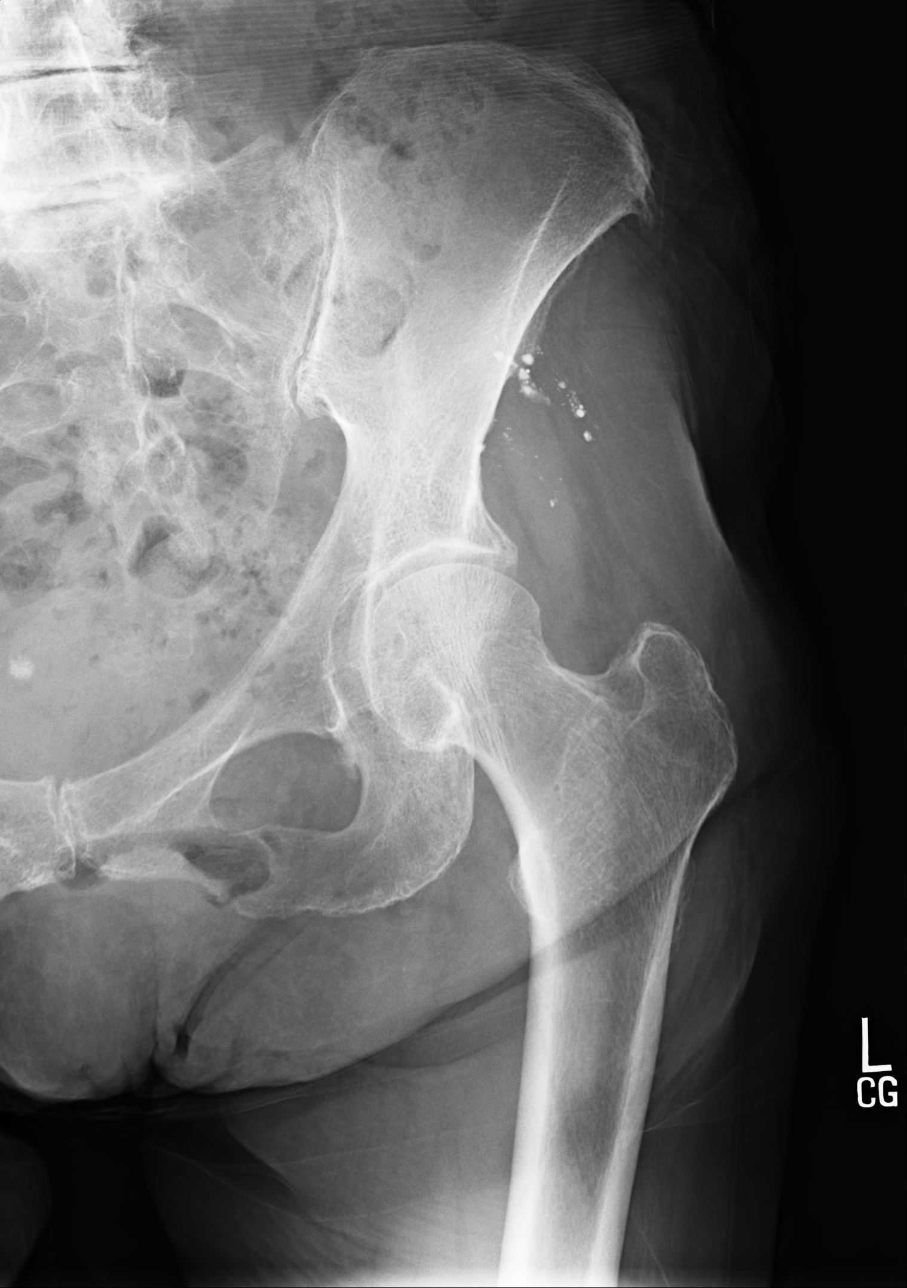
[im 4/4]
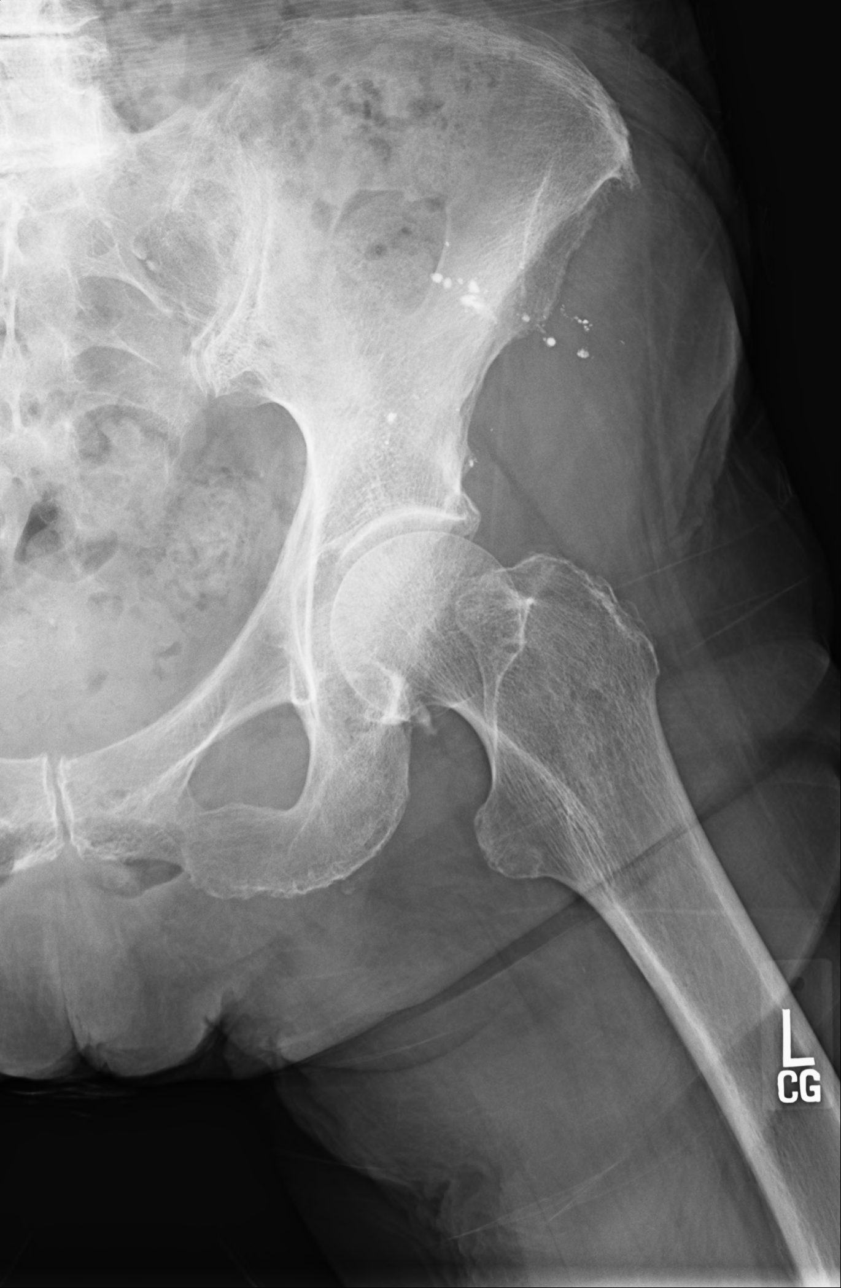

[5 of 5 positions shown; findings below may reference images not displayed]

FINDINGS: Pelvis: The pelvis is free of fracture or localized osseous defect. There is no bone destructive lesion. Sacroiliac joints are open. Pubic rami are intact. The hips are symmetrical and show very mild degenerative change.

In the right side of the pelvis there is an 8 mm calcification with irregular border possibly representing a vesicle calculus that was not present on the previous pelvic examination 11/05/15.

There is severe degenerative disc disease in the lower lumbar spine.

Right hip: There is mild degenerative change without reactive sclerosis, subchondral cellule formation or osteophyte formation.

Left hip: There is mild degenerative change without reactive sclerosis, subchondral cellule formation or osteophyte formation.
IMPRESSION: 1. Mild degenerative change both hips.

2. Possible 8 mm vesicle calculus.

## 2023-02-09 ENCOUNTER — Telehealth (INDEPENDENT_AMBULATORY_CARE_PROVIDER_SITE_OTHER): Payer: Self-pay | Admitting: Nurse Practitioner

## 2023-02-09 NOTE — Telephone Encounter (Signed)
Yes, I saw that.  Looking for C&S.

## 2023-02-09 NOTE — Telephone Encounter (Signed)
-----   Message from Zetta Bills sent at 02/04/2023  9:18 PM EDT -----  Regarding: C&S  Hi, could you please see if her urine C&S is back yet?  TY

## 2023-02-09 NOTE — Telephone Encounter (Signed)
Final result of UA in [facility] under 'labs"

## 2023-02-10 ENCOUNTER — Encounter (INDEPENDENT_AMBULATORY_CARE_PROVIDER_SITE_OTHER): Payer: Self-pay | Admitting: Nurse Practitioner

## 2023-02-10 NOTE — Telephone Encounter (Signed)
Spoke with dtr and mother ok.  Will not repeat at this time.

## 2023-02-10 NOTE — Telephone Encounter (Signed)
Spoke with [name] at [facility] lab sent UA to [employer], but no gray tube for cx was present so then by the time the gray tube got to [facility] for cx test, it was outside the 48 hour window for resulting.       ( 7 min)

## 2023-02-15 ENCOUNTER — Encounter (INDEPENDENT_AMBULATORY_CARE_PROVIDER_SITE_OTHER): Payer: Self-pay | Admitting: Nurse Practitioner

## 2023-02-15 MED ORDER — LEVOTHYROXINE SODIUM 50 MCG PO TABS
50.0000 ug | ORAL_TABLET | Freq: Every day | ORAL | 3 refills | Status: DC
Start: 2023-02-15 — End: 2023-10-18

## 2023-03-01 ENCOUNTER — Other Ambulatory Visit (INDEPENDENT_AMBULATORY_CARE_PROVIDER_SITE_OTHER): Payer: Self-pay | Admitting: Nurse Practitioner

## 2023-03-01 MED ORDER — MIRTAZAPINE 15 MG PO TABS
15.0000 mg | ORAL_TABLET | Freq: Every evening | ORAL | 0 refills | Status: DC
Start: 2023-03-01 — End: 2023-05-28

## 2023-03-01 NOTE — Telephone Encounter (Signed)
Fax refill request.

## 2023-03-10 ENCOUNTER — Encounter: Payer: Self-pay | Admitting: Neurology

## 2023-03-10 ENCOUNTER — Ambulatory Visit: Payer: Medicare Other | Attending: Neurology | Admitting: Neurology

## 2023-03-10 DIAGNOSIS — R569 Unspecified convulsions: Secondary | ICD-10-CM | POA: Insufficient documentation

## 2023-03-10 MED ORDER — BRIVIACT 100 MG PO TABS
1.0000 | ORAL_TABLET | Freq: Every evening | ORAL | 1 refills | Status: DC
Start: 2023-03-10 — End: 2023-09-08

## 2023-03-10 MED ORDER — BRIVARACETAM 50 MG PO TABS
50.0000 mg | ORAL_TABLET | Freq: Every morning | ORAL | 1 refills | Status: DC
Start: 2023-03-10 — End: 2023-09-08

## 2023-03-10 MED ORDER — MAGNESIUM 400 MG PO TABS
1.0000 | ORAL_TABLET | Freq: Two times a day (BID) | ORAL | 1 refills | Status: DC
Start: 2023-03-10 — End: 2023-08-06

## 2023-03-10 NOTE — Progress Notes (Signed)
Subjective:       Please see detailed assessment and plan    Patient ID: [name] is a 89 y.o. female visiting from [place] hx HTN, HLD, hypothyroid, irregular heartbeat, sternotomy for aortic valve replacement >10 years ago then repeat aortic valve replacement 5-6 years ago (procedure sounds like TAVR), intracerebral hemorrhage after fall 2021 [facility] [place], [place] - no residual deficit per daughter), trauma admit 8/15-8/18 for left temporal and smaller rigth temporal hemorrhagic contusion + left greater than right SDH (only right SDH is along tentorial leaflet) + IVH  here for Seizures  .    Current visit:     See detailed assessment / plan for the new developments since last visit and the respective new recommendations.     HPI  Background from Dr. Wilburt Finlay discharge note 02/2021 "89 y.o. female visiting from [place] hx HTN, HLD, hypothyroid, irregular heartbeat, sternotomy for aortic valve replacement >10 years ago then repeat aortic valve replacement 5-6 years ago (procedure sounds like TAVR), intracerebral hemorrhage after fall 2021 [facility] [place], [place] - no residual deficit per daughter), trauma admit 8/15-8/18 for left temporal and smaller rigth temporal hemorrhagic contusion + left greater than right SDH (only right SDH is along tentorial leaflet) + IVH who presents to the hospital with expressive and receptive aphasia. She was doing well after the trauma admit until ~10:30 today when at brunch it was difficult to get her attention even when calling out her name. Later she was found sitting with her head down in her hands and again it was difficult to get her attention. When she spoke there was word finding difficulty and some word salad. She appeared to have expressive and receptive aphasia for me. She denies headache. These symptoms are sudden onset, moderate intensity, without alleviating factors.    Her daughter also noted one episode of right hand shaking last  admission making it difficult for her to use a spoon. Her daughter has noted infrequent right facial twitching which started prior to the trauma admission."    Of note: Once patient was placed on briviact, her coomunication issues were gone.  For about 5 years before she was having right sided facial twitching,  which also stopped after starting Briviact.  Next CT scheduled 10/18/ 2022 with followup with neurosurgery..  Mother usually lives in [place] by herself and is here now to stay.  Emphasized no driving for at least 6 months per state law.    cvEEG 03/12/2021  IMPRESSION: Abnormal EEG due to:   1. Occasional left temporal intermittent rhythmic delta activity (TIRDA)   2. Left anterior to midtemporal focal slowing    MRI Brain 03/10/2021  IMPRESSION:   1. No acute infarct.   2. Evolution and decreased size of hematoma in the lateral left temporal lobe and smaller hemorrhagic contusion in the right parietal lobe with surrounding edema as detailed. No interval hemorrhage compared to the prior 03/04/2021 MRI  3. Stable left subdural hematoma and sequela of prior subarachnoid hemorrhage as detailed above.  4. Mild global volume loss, chronic small vessel ischemic changes and multiple chronic cerebellar infarcts.     CT head 03/21/2021  IMPRESSION:    1.  Interval resolution of parenchymal hemorrhage associated with a prior left MCA/temporal lobe infarct since prior noncontrast head CT.   2.  Improved subdural hematoma along the left tentorial leaflet and posterior cerebral falx with only trace residual.   3.  No new hemorrhage or evidence  of a new large vascular territory infarct.  4.  Otherwise similar chronic findings, as above, as on prior imaging.    Review of Systems  No other motor, sensory, vision, cognitive, speech, swallow, bowel, or bladder changes; no tremors, convulsions, seizure activity, or loss of consciousness episodes.  All other systems were reviewed and were negative.     Current Outpatient Medications  on File Prior to Visit   Medication Sig Dispense Refill    acetaminophen (TYLENOL) 650 MG CR tablet Take 1 tablet (650 mg) by mouth every 8 (eight) hours as needed for Pain 30 tablet 0    ALPRAZolam (Xanax) 0.25 MG tablet Take 1 tablet (0.25 mg) by mouth 2 (two) times daily as needed for Anxiety 30 tablet 0    Efinaconazole (Jublia) 10 % Solution Apply 1 .application topically daily To affected toenails for 48 weeks. 4 mL 6    levothyroxine (SYNTHROID) 50 MCG tablet Take 1 tablet (50 mcg) by mouth daily 90 tablet 3    lisinopril (ZESTRIL) 10 MG tablet TAKE 1 TABLET ORALLY ONCE A DAY EVERY MORNING FOR 30 DAY(S) 90 tablet 2    mirtazapine (REMERON) 15 MG tablet Take 1 tablet (15 mg) by mouth nightly 90 tablet 0    Multiple Vitamins-Minerals (Centrum Silver 50+Women) Tab Take 1 tablet by mouth daily 100 tablet 3    rosuvastatin (CRESTOR) 20 MG tablet Take 1 tablet (20 mg) by mouth nightly 90 tablet 3    [DISCONTINUED] brivaracetam (Briviact) 100 MG Tab tablet Take 1 tablet (100 mg) by mouth every evening 90 tablet 1    [DISCONTINUED] Brivaracetam (BRIVIACT) 50 MG Tab tablet Take 1 tablet (50 mg) by mouth every morning 90 tablet 1    [DISCONTINUED] Magnesium 400 MG Tab Take 1 tablet (400 mg) by mouth daily as needed (spasms) 90 tablet 1    amLODIPine (NORVASC) 5 MG tablet Take 1 tablet (5 mg) by mouth nightly Hold if SBP <100.  May take two 5 mg (10 mg) if SBP > 160. 30 tablet 2    lidocaine (LMX) 4 % cream Apply topically 4 (four) times daily as needed (pain) (Patient not taking: Reported on 03/10/2023) 28 g 3     No current facility-administered medications on file prior to visit.     Current/Home Medications    ACETAMINOPHEN (TYLENOL) 650 MG CR TABLET    Take 1 tablet (650 mg) by mouth every 8 (eight) hours as needed for Pain    ALPRAZOLAM (XANAX) 0.25 MG TABLET    Take 1 tablet (0.25 mg) by mouth 2 (two) times daily as needed for Anxiety    AMLODIPINE (NORVASC) 5 MG TABLET    Take 1 tablet (5 mg) by mouth nightly  Hold if SBP <100.  May take two 5 mg (10 mg) if SBP > 160.    EFINACONAZOLE (JUBLIA) 10 % SOLUTION    Apply 1 .application topically daily To affected toenails for 48 weeks.    LEVOTHYROXINE (SYNTHROID) 50 MCG TABLET    Take 1 tablet (50 mcg) by mouth daily    LIDOCAINE (LMX) 4 % CREAM    Apply topically 4 (four) times daily as needed (pain)    LISINOPRIL (ZESTRIL) 10 MG TABLET    TAKE 1 TABLET ORALLY ONCE A DAY EVERY MORNING FOR 30 DAY(S)    MIRTAZAPINE (REMERON) 15 MG TABLET    Take 1 tablet (15 mg) by mouth nightly    MULTIPLE VITAMINS-MINERALS (CENTRUM SILVER 50+WOMEN) TAB  Take 1 tablet by mouth daily    ROSUVASTATIN (CRESTOR) 20 MG TABLET    Take 1 tablet (20 mg) by mouth nightly     Allergies   Allergen Reactions    Codeine Nausea And Vomiting      Patient Active Problem List    Diagnosis Date Noted    Foot pain, bilateral [M79.671, M79.672] 01/29/2023    Pedal edema [R60.0] 01/29/2023    Chronic right shoulder pain [M25.511, G89.29] 10/03/2022    Right hand and foot pain [M79.641, M79.671] 10/03/2022    Anxiety [F41.9] 10/03/2022    Chronic low back pain [M54.50, G89.29] 06/27/2022    Fall [W19.XXXA] 02/01/2022    Diarrhea [R19.7] 02/01/2022    Syncope [R55] 11/25/2021    Onychomycosis [B35.1] 10/29/2021    Neck pain, bilateral posterior [M54.2] 09/11/2021    Loss of appetite [R63.0] 08/14/2021    Fatigue [R53.83] 08/14/2021    Seizures [R56.9] 08/14/2021    Traumatic subdural hemorrhage with loss of consciousness of unspecified duration, initial encounter [S06.5X9A] 08/06/2021    Situational depression [F43.21] 07/13/2021    Post-COVID syndrome resolved [Z86.16] 07/13/2021    Toe pain, left [M79.675] 05/07/2021    Constipation [K59.00] 05/07/2021    Hypertension [I10] 05/07/2021    Hyperlipidemia [E78.5] 05/07/2021    Palpitations [R00.2] 04/21/2021    S/p TAVR (transcatheter aortic valve replacement), 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] 04/18/2021    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] 04/18/2021    Hypothyroidism  [E03.9] 04/18/2021    Osteoporosis [M81.0] 04/18/2021    Recurrent UTI [N39.0] 04/18/2021    Gait abnormality [R26.9] 04/18/2021    Cerebral edema [G93.6] 03/10/2021    Left temporal and right parietal hemorrhagic contusions on MRI 03/04/21 [I61.9] 03/10/2021    Subdural hematoma [S06.5XAA] 03/10/2021    Hyponatremia [E87.1] 03/10/2021    Aphasia [R47.01] 03/09/2021    Nontraumatic acute subdural hemorrhage [I62.01] 03/03/2021     Past Surgical History:   Procedure Laterality Date    AORTIC VALVE REPLACEMENT      first AVR required sternotomy, >10 years ago per patient    BACK SURGERY      HYSTERECTOMY      REPLACEMENT TOTAL KNEE Bilateral     left total, right "half knee" replacement    TRANSCATHETER AORTIC VALVE REPLACEMENT  2016    5-6 years ago per patient    WRIST SURGERY Left 2017     Social History     Socioeconomic History    Marital status: Widowed   Tobacco Use    Smoking status: Never    Smokeless tobacco: Never   Vaping Use    Vaping status: Never Used   Substance and Sexual Activity    Alcohol use: Not Currently     Alcohol/week: 3.0 standard drinks of alcohol     Types: 3 Glasses of wine per week     Comment: 1-2 a week    Drug use: Never    Sexual activity: Not Currently     Social Determinants of Health     Financial Resource Strain: Low Risk  (01/15/2023)    Overall Financial Resource Strain (CARDIA)     Difficulty of Paying Living Expenses: Not hard at all   Food Insecurity: No Food Insecurity (01/15/2023)    Hunger Vital Sign     Worried About Running Out of Food in the Last Year: Never true     Ran Out of Food in the Last Year: Never true   Transportation Needs: No Transportation Needs (  01/15/2023)    PRAPARE - Therapist, art (Medical): No     Lack of Transportation (Non-Medical): No   Physical Activity: Sufficiently Active (01/15/2023)    Exercise Vital Sign     Days of Exercise per Week: 5 days     Minutes of Exercise per Session: 40 min   Stress: No Stress Concern Present  (01/15/2023)    [employer] of Occupational Health - Occupational Stress Questionnaire     Feeling of Stress : Not at all   Social Connections: Moderately Integrated (01/15/2023)    Social Connection and Isolation Panel [NHANES]     Frequency of Communication with Friends and Family: More than three times a week     Frequency of Social Gatherings with Friends and Family: More than three times a week     Attends Religious Services: More than 4 times per year     Active Member of [employer] or Organizations: Yes     Attends Banker Meetings: More than 4 times per year     Marital Status: Widowed   Intimate Partner Violence: Not At Risk (01/15/2023)    Humiliation, Afraid, Rape, and Kick questionnaire     Fear of Current or Ex-Partner: No     Emotionally Abused: No     Physically Abused: No     Sexually Abused: No   Housing Stability: Low Risk  (01/15/2023)    Housing Stability Vital Sign     Unable to Pay for Housing in the Last Year: No     Number of Places Lived in the Last Year: 2     Unstable Housing in the Last Year: No     Family History   Problem Relation Age of Onset    Stroke Mother     Anuerysm Mother     Cancer Father     Heart disease Sister     Heart disease Brother     Intracerebral hemorrhage Neg Hx      She  has a past medical history of Bleeding in head following injury with loss of consciousness, Convulsions, Hyperlipidemia, Hypertension, Hypothyroidism, and Irregular heartbeat.        Objective:      Physical Exam Neurologic Exam    Vital Signs:  Reviewed    General: Well developed and well nourished. No acute distress. Cooperative with the exam  ENT: Normal oral mucosa, no ear or nose discharge  Neck: Symmetric, no deformities  CV: RRR  Resp: No audible wheezing, normal work of breathing  Abd: Soft, nondistended  Skin: Intact, extremities normal in color  Psych: Affect is normal, good insight    Mental Status: The patient is awake, alert and oriented to person, place, and time.  Affect is  normal  Fund of knowledge appropriate  Recent and remote memory are intact   Attention span and concentration appear normal.  Language function is normal. There is no evidence of aphasia in conversational speech.    Cranial nerves:   -CN II: Visual fields full to bedside confrontation   -CN III, IV, VI: Pupils equal, round, and reactive to light; extraocular movements intact; no ptosis              -CN V: Facial sensation intact in V1 through V3 distributions   -CN VII: Face symmetric   -CN VIII: Hearing intact to conversational speech   -CN IX, X: Palate elevates symmetrically; normal phonation   -CN XI: Symmetric full  strength of sternocleidomastoid and trapezius muscles   -CN XII: Tongue protrudes midline    Motor: Muscle tone normal without spasticity or flaccidity. No atrophy.  No pronator drift.     UEs:   Deltoid Bicep Tricep WE WF Grip IO   Right 5 5 5 5 5 5 5    Left 5 5 5 5 5 5 5      LEs   HF HE KF KE PF DF   Right 5 5 5 5 5 5    Left 5 5 5 5 5 5      Sensory:   Light touch intact.  Temperature intact.  Vibration intact.    Reflexes:      B T BR P A   Right 2 2 2 2 2    Left 2 2 2 2 2      Plantars:    Coordination: FTN intact, no truncal ataxia. RAMs intact. 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 female visiting from [place] hx HTN, HLD, hypothyroid, irregular heartbeat, sternotomy for aortic valve replacement >10 years ago then repeat aortic valve replacement 5-6 years ago (procedure sounds like TAVR), intracerebral hemorrhage after fall 2021 [facility] [place], [place] - no residual deficit per daughter), trauma admit 8/15-8/18 for left temporal and smaller rigth temporal hemorrhagic contusion + left greater than right SDH (only right SDH is along tentorial leaflet) + IVH  here for Seizures    08/06/2021 visit:  Patient and family deny seizures since last visit.  Patient has been having eye twitching that has been coming and going.  Initially the Briviact helped with the eye twitching then eye twitching returned then went away again.  When asked about PT for gait disturbance, it was noted that patient did not start PT.  Patient went to [place] for a lengthy visit and while her daughter noted that the patient's balance continues to be poor, the daughter noted that patient's shoes are very large and slide around on her.  Daughter reported that she will get the patient thicker socks.  Daughter reports that the patient is pretty steady using her walker, however, does not use her walker at home and uses a wheelchair when out for longer trips.    10/10/21 visit:  reduced briviact helped a lot. Will add magnesium 400. No seizures or auras since last visit. No ER visits. Compliant with medications. Tolerating well.     04/10/22 visit: No seizures or auras since last visit. No ER visits. Compliant with medications. Tolerating well. Compliance is good.     10/07/22 visit: No seizures or auras since last visit. No ER visits. Compliant with medications. Tolerating well. Compliance is good.     03/10/2023 visit: Had episode of eye twitching but adding magnesium to help her.  Otherwise, No seizures or auras since last visit. No ER visits. Compliant with medications. Tolerating well. Compliance is good. Again readdressed/ discussed the need for AEDs continuation/ duration of medication. Refills  sent to the requested pharmacy. Will follow up 6 monthly intervals. She will approach me through my chart if any concerns or questions in the mean time.    Time spent today (including but not limited  to EEG review, neuroimaging review, previous clinical notes reviewed, discussion with the patient regarding the plan and documentation)  = 40 minutes    Currently Pertinent h/o negative for diplopia, dysphagia, dysarthria, facial drooping. Facial numbness. Focal numbness, focal weakness. Neck trauma, back trauma,radicular pain, gait disturbances, LOC episodes, LOA episodes, falls, bowel bladder disturbances.    More than 50% of this office visit was spent counseling the patient on one or more of the following:   Disease state - discussion about the medical condition, diagnostic and treatment options   Medications - indications and side effects   Lifestyle issues as appropriate.         Recommend:  Continue magnesium 400.   Continue  Briviact to 50 mg in Am and 100 mg in PM refills sent to pharmacy    Continue to participate socially, physically and cognitively as much he can to improve brain health  Refills sent the pharmacy as requested:   Continue Seizure precautions, avoid seizure triggers like missing seizure meds, lack of sleep, too much physical/cognitive stress, driving restrictions per state law ( in [place], be aware not to drive for six months from last seizure with LOC/LOA).   Advised patient to use Mychart for any questions in the interm    Return to clinic in 6 months : you can schedule the appt by calling [phone]               Plan:      No orders of the defined types were placed in this encounter.    Medications Ordered This Encounter         Disp Refills Start End    brivaracetam (Briviact) 100 MG Tab tablet 90 tablet 1 03/10/2023 --    Take 1 tablet (100 mg) by mouth every evening - Oral    Notes to Pharmacy: Patient traveling, please dispense 3 month supply-- use vacation override if needed    Brivaracetam (BRIVIACT) 50 MG Tab tablet 90 tablet 1 03/10/2023 --    Take 1 tablet (50 mg) by mouth every morning - Oral    Notes to Pharmacy: Patient traveling, please dispense 3 month supply-- use vacation  override if needed    Magnesium 400 MG Tab 180 tablet 1 03/10/2023 --    Take 1 tablet (400 mg) by mouth 2 (two) times daily - Oral        All relevant and clinical information was transcribed by me, Norvel Richards. Clemon Chambers, NP, acting as a scribe for Dr. Rosalio Macadamia.    Agree     Additional notes and data scanned including patient questionnaire which may contain pertinent information to visit. Patient can follow up sooner if needed. In the meantime, patient will contact the office with any questions or concerns.     Total of 40 minutes were spent on the day of service including face-to-face time with patient, coordinating care, record review and documentation, explaining the natural history of seizures, types of seizures, triggers for seizures.  Need for the necessary testing.  EEG and MRI.  Need for a seizure medication compliance for medications.  Seizure precautions including avoid driving per [place].  6 months from the last seizure.  Side effects of seizure medications.  Avoiding triggers for seizures.  Backup plan, and the time of partial seizure and convulsive seizures.  Including going to the emergency room.  Taking emergency rescue medications.           Richrd Humbles, MD. FAES    Director, [facility]  Assistant Professor, [facility]  Board Certified,Neurology  Board Certified, Clinical Neurophysiology    [email or web address]      This note was generated by the Epic EMR system/ Dragon speech recognition and may contain inherent errors or omissions not intended by the user. Grammatical errors, random word insertions, deletions, pronoun errors and incomplete sentences are occasional consequences of this technology due to software limitations. Not all errors are caught or corrected.Although every attempt is made to root out erroneus and incomplete transcription, the note may still not fully represent the intent or opinion of the  author. If there are questions or concerns about the content of this note or information contained within the body of this dictation they should be addressed directly with the author for clarification.*

## 2023-03-22 ENCOUNTER — Encounter (INDEPENDENT_AMBULATORY_CARE_PROVIDER_SITE_OTHER): Payer: Self-pay | Admitting: Nurse Practitioner

## 2023-03-23 ENCOUNTER — Other Ambulatory Visit (INDEPENDENT_AMBULATORY_CARE_PROVIDER_SITE_OTHER): Payer: Self-pay | Admitting: Nurse Practitioner

## 2023-03-23 MED ORDER — AMLODIPINE BESYLATE 5 MG PO TABS
5.0000 mg | ORAL_TABLET | Freq: Every evening | ORAL | 2 refills | Status: DC
Start: 2023-03-23 — End: 2023-05-17

## 2023-03-26 ENCOUNTER — Encounter (INDEPENDENT_AMBULATORY_CARE_PROVIDER_SITE_OTHER): Payer: Medicare Other | Admitting: Nurse Practitioner

## 2023-03-29 ENCOUNTER — Other Ambulatory Visit (INDEPENDENT_AMBULATORY_CARE_PROVIDER_SITE_OTHER): Payer: Self-pay | Admitting: Nurse Practitioner

## 2023-03-29 DIAGNOSIS — E785 Hyperlipidemia, unspecified: Secondary | ICD-10-CM

## 2023-03-29 MED ORDER — ROSUVASTATIN CALCIUM 20 MG PO TABS
20.0000 mg | ORAL_TABLET | Freq: Every evening | ORAL | 3 refills | Status: DC
Start: 2023-03-29 — End: 2024-04-10

## 2023-04-05 ENCOUNTER — Other Ambulatory Visit (INDEPENDENT_AMBULATORY_CARE_PROVIDER_SITE_OTHER): Payer: Self-pay | Admitting: Nurse Practitioner

## 2023-04-05 NOTE — Progress Notes (Unsigned)
Outside Message via Surescripts, RN updated pt record - Historical Immunizations adding Covid vaccine Proofreader) admin at [facility].     ( 7 min)

## 2023-05-06 ENCOUNTER — Other Ambulatory Visit (INDEPENDENT_AMBULATORY_CARE_PROVIDER_SITE_OTHER): Payer: Self-pay | Admitting: Nurse Practitioner

## 2023-05-06 DIAGNOSIS — I10 Essential (primary) hypertension: Secondary | ICD-10-CM

## 2023-05-06 MED ORDER — LISINOPRIL 10 MG PO TABS
ORAL_TABLET | ORAL | 2 refills | Status: DC
Start: 2023-05-06 — End: 2023-10-15

## 2023-05-17 ENCOUNTER — Other Ambulatory Visit (INDEPENDENT_AMBULATORY_CARE_PROVIDER_SITE_OTHER): Payer: Self-pay | Admitting: Nurse Practitioner

## 2023-05-17 MED ORDER — AMLODIPINE BESYLATE 5 MG PO TABS
5.0000 mg | ORAL_TABLET | Freq: Every evening | ORAL | 3 refills | Status: DC
Start: 2023-05-17 — End: 2024-04-10

## 2023-05-17 NOTE — Telephone Encounter (Signed)
[facility] day RX asked at [address]    Amlodipine besylate 5 mg with hold parameters and also increased dose amount for HTN

## 2023-05-20 ENCOUNTER — Encounter (INDEPENDENT_AMBULATORY_CARE_PROVIDER_SITE_OTHER): Payer: Self-pay | Admitting: Nurse Practitioner

## 2023-05-21 ENCOUNTER — Encounter (INDEPENDENT_AMBULATORY_CARE_PROVIDER_SITE_OTHER): Payer: Self-pay | Admitting: Nurse Practitioner

## 2023-05-21 ENCOUNTER — Ambulatory Visit: Payer: Medicare Other | Admitting: Nurse Practitioner

## 2023-05-21 VITALS — BP 118/62 | HR 79 | Temp 97.0°F

## 2023-05-21 DIAGNOSIS — I10 Essential (primary) hypertension: Secondary | ICD-10-CM

## 2023-05-21 DIAGNOSIS — E871 Hypo-osmolality and hyponatremia: Secondary | ICD-10-CM

## 2023-05-21 DIAGNOSIS — E039 Hypothyroidism, unspecified: Secondary | ICD-10-CM

## 2023-05-21 DIAGNOSIS — M545 Low back pain, unspecified: Secondary | ICD-10-CM

## 2023-05-21 DIAGNOSIS — R569 Unspecified convulsions: Secondary | ICD-10-CM

## 2023-05-21 DIAGNOSIS — M79672 Pain in left foot: Secondary | ICD-10-CM

## 2023-05-21 DIAGNOSIS — M79671 Pain in right foot: Secondary | ICD-10-CM

## 2023-05-21 DIAGNOSIS — Z953 Presence of xenogenic heart valve: Secondary | ICD-10-CM

## 2023-05-21 DIAGNOSIS — Z23 Encounter for immunization: Secondary | ICD-10-CM

## 2023-05-21 DIAGNOSIS — G8929 Other chronic pain: Secondary | ICD-10-CM

## 2023-05-21 NOTE — Progress Notes (Signed)
 Date: 05/21/2023    Patient Name: [name]    Patient was seen in their home (POS 12) in lieu of an office visit for the following reason:   Requires use of an assistive device in order to ambulate due to ambulatory dysfunction.        Cod

## 2023-05-23 ENCOUNTER — Encounter (INDEPENDENT_AMBULATORY_CARE_PROVIDER_SITE_OTHER): Payer: Self-pay | Admitting: Nurse Practitioner

## 2023-05-23 DIAGNOSIS — Z23 Encounter for immunization: Secondary | ICD-10-CM | POA: Insufficient documentation

## 2023-05-23 NOTE — Assessment & Plan Note (Addendum)
A/P:  Check TSH and Free T4.

## 2023-05-23 NOTE — Assessment & Plan Note (Signed)
A/P:  Suggested they see Dr. Franchot Erichsen for f/u.  May need echo.

## 2023-05-23 NOTE — Assessment & Plan Note (Signed)
A/P:  Resolved after purchasing new mattress.

## 2023-05-23 NOTE — Assessment & Plan Note (Signed)
A/P:  Continue Diclofenac 1% gel as needed.

## 2023-05-23 NOTE — Assessment & Plan Note (Signed)
A/P: BMP

## 2023-05-23 NOTE — Assessment & Plan Note (Addendum)
A/P:  Asked to continue tracking readings and contact me if SBP consistently >140.   No change amlodipine or lisinopril.  Check BMP.

## 2023-05-23 NOTE — Assessment & Plan Note (Signed)
A/P:  Admin right deltoid.  Tolerated well.

## 2023-05-23 NOTE — Assessment & Plan Note (Signed)
A/P:  Stable on Briviact and magnesium.  F/U with neuro about Feb 2025.

## 2023-05-28 ENCOUNTER — Other Ambulatory Visit (INDEPENDENT_AMBULATORY_CARE_PROVIDER_SITE_OTHER): Payer: Self-pay | Admitting: Nurse Practitioner

## 2023-05-28 MED ORDER — MIRTAZAPINE 15 MG PO TABS
15.0000 mg | ORAL_TABLET | Freq: Every evening | ORAL | 0 refills | Status: DC
Start: 2023-05-28 — End: 2023-07-08

## 2023-05-28 NOTE — Telephone Encounter (Signed)
 Fax refill request.

## 2023-05-31 ENCOUNTER — Other Ambulatory Visit (INDEPENDENT_AMBULATORY_CARE_PROVIDER_SITE_OTHER): Payer: Self-pay | Admitting: Nurse Practitioner

## 2023-05-31 ENCOUNTER — Ambulatory Visit: Payer: Medicare Other | Attending: Nurse Practitioner

## 2023-05-31 DIAGNOSIS — E039 Hypothyroidism, unspecified: Secondary | ICD-10-CM | POA: Insufficient documentation

## 2023-05-31 DIAGNOSIS — I10 Essential (primary) hypertension: Secondary | ICD-10-CM | POA: Insufficient documentation

## 2023-05-31 LAB — TSH: TSH: 0.93 u[IU]/mL (ref 0.35–4.94)

## 2023-05-31 LAB — BASIC METABOLIC PANEL
Anion Gap: 5 (ref 5.0–15.0)
BUN: 27 mg/dL — ABNORMAL HIGH (ref 7–21)
CO2: 25 meq/L (ref 17–29)
Calcium: 8.7 mg/dL (ref 7.9–10.2)
Chloride: 105 meq/L (ref 99–111)
Creatinine: 1.3 mg/dL — ABNORMAL HIGH (ref 0.4–1.0)
GFR: 37.9 mL/min/{1.73_m2} — ABNORMAL LOW (ref >=60.0)
Glucose: 119 mg/dL — ABNORMAL HIGH (ref 70–100)
Hemolysis Index: 4 {index}
Potassium: 4.6 meq/L (ref 3.5–5.3)
Sodium: 135 meq/L (ref 135–145)

## 2023-05-31 LAB — T4, FREE: T4 Free: 1.46 ng/dL (ref 0.69–1.48)

## 2023-05-31 NOTE — Progress Notes (Signed)
Labs reviewed.  TSH is fine.  She is a little dehydrated bringing creatinine up and GFR down.  Sent MyChart message to daughter asking her to encourage her mother to drink fluids and will recheck BMP in January.

## 2023-06-29 ENCOUNTER — Encounter (INDEPENDENT_AMBULATORY_CARE_PROVIDER_SITE_OTHER): Payer: Self-pay | Admitting: Internal Medicine

## 2023-06-29 ENCOUNTER — Ambulatory Visit (INDEPENDENT_AMBULATORY_CARE_PROVIDER_SITE_OTHER): Payer: Medicare Other | Admitting: Internal Medicine

## 2023-06-29 VITALS — BP 126/68 | HR 76 | Ht 62.21 in | Wt 157.0 lb

## 2023-06-29 DIAGNOSIS — I10 Essential (primary) hypertension: Secondary | ICD-10-CM

## 2023-06-29 DIAGNOSIS — R002 Palpitations: Secondary | ICD-10-CM

## 2023-06-29 DIAGNOSIS — Z953 Presence of xenogenic heart valve: Secondary | ICD-10-CM

## 2023-06-29 LAB — ECG 12-LEAD
Atrial Rate: 70 {beats}/min
P Axis: 66 degrees
P-R Interval: 148 ms
Q-T Interval: 422 ms
QRS Duration: 100 ms
QTC Calculation (Bezet): 455 ms
R Axis: 37 degrees
T Axis: 48 degrees
Ventricular Rate: 70 {beats}/min

## 2023-06-29 NOTE — Patient Instructions (Signed)
[employer] device

## 2023-06-29 NOTE — Progress Notes (Signed)
[facility] OFFICE CONSULTATION    I had the pleasure of seeing Ms. Desa today for cardiovascular evaluation. She is a pleasant 89 y.o. 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  Patient last seen by Dr. Franchot Erichsen in 2022.  She is a companied by her daughter.  She moved from [place] in 2022.  She has been doing well.  She has occasional episodes of palpitations every few months lasting for several minutes.  Usually only at night.  She has brought her home blood pressure log today which shows adequately controlled blood pressure readings.  Heart rates have been normal.  Blood pressure monitor has reported irregular heartbeat.  PACs on ECG today.  She has no chest pain or shortness of breath.  She denies dizziness and syncope.  She is compliant with antibiotics prior to dental procedures.  She has dental cleaning every 6 months.  She has not been maintained on aspirin following subarachnoid and subdural hematomas.          PAST MEDICAL HISTORY: She has a past medical history of Bleeding in head following injury with loss of consciousness, Convulsions, Hyperlipidemia, Hypertension, Hypothyroidism, and Irregular heartbeat. She has a past surgical history that includes Hysterectomy; Replacement total knee (Bilateral); Back surgery; TRANSCATHETER AORTIC VALVE REPLACEMENT (2016); Aortic valve replacement; and Wrist surgery (Left, 2017).        MEDICATIONS: Current Medications[1]        ALLERGIES: Allergies[2]      FAMILY HISTORY: Her family history includes Anuerysm in her mother; Cancer in her father; Heart disease in her brother and sister; Stroke in her mother.      SOCIAL HISTORY: She reports that she has never smoked. She has never used smokeless tobacco. She reports that she does not currently use alcohol after a past usage of about 3.0 standard drinks of alcohol per week. She reports that she does  not use drugs.      REVIEW OF SYSTEMS: All other systems reviewed and negative except as above.         PHYSICAL EXAMINATION  General Appearance:  A well-appearing female in no acute distress.    Vital Signs: BP 126/68 (BP Site: Left arm, Patient Position: Sitting, Cuff Size: Medium)   Pulse 76   Ht 1.58 m (5' 2.21")   Wt 71.2 kg (157 lb)   BMI 28.53 kg/m    HEENT: Sclera anicteric, conjunctiva without pallor, moist mucous membranes, normal dentition. No arcus.   Neck:  Supple without jugular venous distention. Thyroid nonpalpable. Normal carotid upstrokes without bruits.   Chest: Clear to auscultation bilaterally with good air movement and respiratory effort and no wheezes, rales, or rhonchi   Cardiovascular: Normal S1 and physiologically split S2 with short systolic murmur aortic area. No gallops or rub. PMI of normal size and nondisplaced.   Extremities: Warm without edema  Skin: No rash, xanthoma or xanthelasma.   Neuro: Alert and oriented x3. Grossly intact. Strength is symmetrical. Normal mood and affect.           ECG: Today, I have independently reviewed the tracing, sinus rhythm, PACs, incomplete right bundle branch block.    Echocardiogram 11/21/2021    Summary    * The left ventricle is normal in size.    * Left ventricular ejection fraction is visually normal with an estimated  ejection fraction of 55-60%.    * There is mild concentric  left ventricular hypertrophy.    * Left ventricular diastolic filling parameters are consistent with Grade I  diastolic dysfunction (impaired relaxation pattern).    * The right ventricular cavity size is normal in size.    * Normal right ventricular systolic function.    * RVSP 34 mmHg.    * There is a bioprosthetic valve in aortic position with normal gradients.  AV vmax 2.07 m/s with mean gradient 9 mmHg.    * Mild mitral valve stenosis: MV Vmax 1.89 m/s with peak (14 mmHg) and mean  ( 5 mmHg) gradient.    * There is mild tricuspid regurgitation.    * Compared to  prior echo report 03/10/21 there is no significant change.        LABS:   Lab Results   Component Value Date    WBC 3.81 02/01/2023    HGB 12.5 02/01/2023    HCT 36.9 02/01/2023    PLT 161 02/01/2023    NA 135 05/31/2023    K 4.6 05/31/2023    MG 2.3 02/01/2023    BUN 27 (H) 05/31/2023    CREAT 1.3 (H) 05/31/2023    GLU 119 (H) 05/31/2023    AST 28 02/01/2023    ALT 26 02/01/2023    HGBA1C 5.6 03/10/2021    TSH 0.93 05/31/2023    TROPI 0.02 03/09/2021     Recent Labs     02/01/23  1210 03/10/21  0353   Cholesterol 137 121   Triglycerides 92 96   HDL 44 35*   LDL Calculated 75 67                      IMPRESSION/RECOMMENDATIONS:     Surgical bioprosthetic AVR 2009 status post TAVR and SAVR 2017 -stable on echocardiogram 11/2021 peak velocity 2.1 m/s, mean gradient 9 mmHg.  Preserved EF 55 to 60%.  Continue with antibiotics prior to dental procedures.  She has not been maintained on aspirin due to traumatic subdural hematoma and subarachnoid hemorrhage in 2022.  Conservative management given advanced age.    Palpitations -rare episodes, every few months.  PACs on ECG today.  BP monitor has been reporting irregular heartbeat.  Patient and daughter not keen on a cardiac monitor.  They will consider Kardia mobile device.  Not keen of invasive testing or intervention given advanced age.  Normal TSH 0.93, normal hemoglobin 12.5.    Hypertension -well-controlled on amlodipine and lisinopril.  Continue same.  Increased creatinine from baseline, previously 1.0, most recent 1.3.  Advised to keep well-hydrated.  Follow-up with PCP.  Normal potassium 4.6.    Hyperlipidemia -maintained on rosuvastatin 20 mg p.o. daily, LDL is at goal 75.  Normal AST and ALT 28 and 26    History of PSVT    Traumatic subdural hematoma subarachnoid hemorrhage 2022 - not on ASA.  Patient follows with neurology.  History of seizure disorder.      Follow-up in 6 months.      Rich Number, MD  06/29/2023, 11:03 AM  [facility]  Cardiology  [phone]         [1]   Current Outpatient Medications   Medication Sig Dispense Refill    ALPRAZolam (Xanax) 0.25 MG tablet Take 1 tablet (0.25 mg) by mouth 2 (two) times daily as needed for Anxiety 30 tablet 0    amLODIPine (NORVASC) 5 MG tablet Take 1 tablet (5 mg) by mouth nightly Hold if SBP <  100.  May take two 5 mg (10 mg) if SBP > 160. 90 tablet 3    brivaracetam (Briviact) 100 MG Tab tablet Take 1 tablet (100 mg) by mouth every evening 90 tablet 1    Brivaracetam (BRIVIACT) 50 MG Tab tablet Take 1 tablet (50 mg) by mouth every morning 90 tablet 1    levothyroxine (SYNTHROID) 50 MCG tablet Take 1 tablet (50 mcg) by mouth daily 90 tablet 3    lidocaine (LMX) 4 % cream Apply topically 4 (four) times daily as needed (pain) 28 g 3    lisinopril (ZESTRIL) 10 MG tablet TAKE 1 TABLET ORALLY ONCE A DAY EVERY MORNING FOR 30 DAY(S) 90 tablet 2    Magnesium 400 MG Tab Take 1 tablet (400 mg) by mouth 2 (two) times daily 180 tablet 1    mirtazapine (REMERON) 15 MG tablet Take 1 tablet (15 mg) by mouth nightly 90 tablet 0    Multiple Vitamins-Minerals (Centrum Silver 50+Women) Tab Take 1 tablet by mouth daily 100 tablet 3    rosuvastatin (CRESTOR) 20 MG tablet Take 1 tablet (20 mg) by mouth nightly 90 tablet 3    acetaminophen (TYLENOL) 650 MG CR tablet Take 1 tablet (650 mg) by mouth every 8 (eight) hours as needed for Pain (Patient not taking: Reported on 06/29/2023) 30 tablet 0    Efinaconazole (Jublia) 10 % Solution Apply 1 .application topically daily To affected toenails for 48 weeks. (Patient not taking: Reported on 06/29/2023) 4 mL 6     No current facility-administered medications for this visit.   [2]   Allergies  Allergen Reactions    Codeine Nausea And Vomiting

## 2023-07-08 ENCOUNTER — Other Ambulatory Visit (INDEPENDENT_AMBULATORY_CARE_PROVIDER_SITE_OTHER): Payer: Self-pay | Admitting: Nurse Practitioner

## 2023-07-08 MED ORDER — MIRTAZAPINE 15 MG PO TABS
15.0000 mg | ORAL_TABLET | Freq: Every evening | ORAL | 0 refills | Status: DC
Start: 2023-07-08 — End: 2023-10-15

## 2023-07-08 NOTE — Telephone Encounter (Signed)
 [facility] asked for refill of mirtazapine for patient 15 mg nightly

## 2023-07-08 NOTE — Telephone Encounter (Signed)
 done

## 2023-07-16 ENCOUNTER — Telehealth (INDEPENDENT_AMBULATORY_CARE_PROVIDER_SITE_OTHER): Payer: Self-pay | Admitting: Nurse Practitioner

## 2023-07-16 NOTE — Telephone Encounter (Signed)
 Pt's daughter called in to request to rescheduled appt on 07/29/22.    Please call [name] with a new date to # [phone].

## 2023-07-19 ENCOUNTER — Ambulatory Visit (INDEPENDENT_AMBULATORY_CARE_PROVIDER_SITE_OTHER): Payer: Medicare Other | Admitting: Family

## 2023-07-26 ENCOUNTER — Other Ambulatory Visit (INDEPENDENT_AMBULATORY_CARE_PROVIDER_SITE_OTHER): Payer: Self-pay | Admitting: Nurse Practitioner

## 2023-07-26 NOTE — Progress Notes (Signed)
 MY CHART message 07/26/23 from dtr:  I wanted to give you a head's up that mom fell yesterday, Jul 25, 2023. She has a goose egg on the back of her noggin. My brother was taking her to lunch after church when she fell outside [employer]. He said once he eventually got her up, she started yelling at him that it was his fault she fell. I kept a close eye on her for the rest of the day and put an ice pack on her lumpy head. I also didn't let her sleep until night time. Her BP was around 148 last night, so it is up (but not crazy). She is a bit achy, so I am giving her Motrin.  Z    Advised dtr to take mother to ED if anything changes and she agreed.

## 2023-07-30 ENCOUNTER — Encounter (INDEPENDENT_AMBULATORY_CARE_PROVIDER_SITE_OTHER): Payer: Medicare Other | Admitting: Nurse Practitioner

## 2023-08-06 ENCOUNTER — Ambulatory Visit: Payer: Medicare Other | Admitting: Nurse Practitioner

## 2023-08-06 VITALS — BP 102/58 | HR 83 | Temp 97.4°F | Resp 20

## 2023-08-06 DIAGNOSIS — E785 Hyperlipidemia, unspecified: Secondary | ICD-10-CM

## 2023-08-06 DIAGNOSIS — M545 Low back pain, unspecified: Secondary | ICD-10-CM

## 2023-08-06 DIAGNOSIS — W19XXXA Unspecified fall, initial encounter: Secondary | ICD-10-CM

## 2023-08-06 DIAGNOSIS — R569 Unspecified convulsions: Secondary | ICD-10-CM

## 2023-08-06 DIAGNOSIS — I10 Essential (primary) hypertension: Secondary | ICD-10-CM

## 2023-08-06 DIAGNOSIS — F419 Anxiety disorder, unspecified: Secondary | ICD-10-CM

## 2023-08-06 DIAGNOSIS — G8929 Other chronic pain: Secondary | ICD-10-CM

## 2023-08-06 MED ORDER — MAGNESIUM 400 MG PO TABS
1.0000 | ORAL_TABLET | Freq: Two times a day (BID) | ORAL | 3 refills | Status: DC
Start: 2023-08-06 — End: 2024-03-29

## 2023-08-06 NOTE — Progress Notes (Addendum)
 Date: 08/06/2023    Patient Name: [name],[name]    Patient was seen in their home (POS 12) in lieu of an office visit for the following reason:   Requires use of an assistive device in order to ambulate due to ambulatory dysfunction.        Code Status: DNAR    HPI:   [name] is a 89 y.o. F that I am here to see for a primary care follow-up visit.  Her daughter, [name], was present during visit and supplemented her information.    Saw a new cardiologist, Dr. Verline, last month.  Was concerned about occasional palpitations every few months which lasts for a few minutes.  BP record reviewed by her and no changes made.  Kardia mobile device recommended and they will consider it.  Invasive testing or intervention giving her age is not in alignment with her goals of care of maintaining comfort.  Will see her again in 6 months.    [name] stated her mother's blood pressure has been stable and she has actually held amlodipine  five times since my last visit in November.  On the other hand, she has also had to double the dose to 10 mg once or twice because SPB >160.  No complaint of chest pain or shortness of breath.    Had a fall about 2 weeks ago and hit the back of her head.  No loss of conscious, nausea, vomiting, or headache.  They have noticed her eye twitching more since the fall but not consistent.  They give her an extra dose of magnesium  which helps.  She will see neurology again in the spring for follow-up.    Has occasional dizziness but admits to not drinking enough.  Will drink some Gatorade and dizziness resolves.    Planning on going to [place]  for a visit mid April.  Problem   Anxiety   Chronic Low Back Pain   Fall   Seizures   Hypertension    BP 102/58, P 83 at time of visit. No CP, SOB, edema     Hyperlipidemia   Post-Covid Syndrome Resolved (Resolved)    Tested positive for Covid-19 while in [place].  Was asymptomatic and took acetaminophen  prn.  Not treateded with Paxlovid.    Daughter reported mother received pneumonia vaccine (which one?) while in [place].     Recurrent Uti (Resolved)    Recent UTI cleared with treatment.         Review of Systems   Constitutional:  Negative for activity change and appetite change.   Eyes:         Eye twitching 2/2 seizures    Respiratory:  Negative for cough and shortness of breath.    Cardiovascular:  Negative for chest pain, palpitations and leg swelling.   Gastrointestinal:  Negative for constipation, diarrhea, nausea and vomiting.   Musculoskeletal:  Positive for back pain.   Neurological:  Negative for dizziness.   Psychiatric/Behavioral:  Negative for sleep disturbance.        Medical History[1]  Past Surgical History[2]  Family History[3]  Social History[4]  Allergies[5]    MEDICATIONS:   Current Medications[6]    PHYSICAL EXAM:   BP 102/58   Pulse 83   Temp 97.4 F (36.3 C)   Resp 20   SpO2 98%    Wt Readings from Last 1 Encounters:   06/29/23 71.2 kg (157 lb)      Ht Readings from Last 1 Encounters:  06/29/23 1.58 m (5' 2.21)     Physical Exam  Constitutional:       General: She is not in acute distress.     Appearance: Normal appearance. She is normal weight. She is not ill-appearing or toxic-appearing.   Cardiovascular:      Rate and Rhythm: Normal rate and regular rhythm.      Pulses: Normal pulses.      Heart sounds: Normal heart sounds.   Pulmonary:      Effort: Pulmonary effort is normal.      Breath sounds: Normal breath sounds.   Abdominal:      General: Bowel sounds are normal. There is distension.      Palpations: Abdomen is soft.      Tenderness: There is no abdominal tenderness. There is no guarding.   Musculoskeletal:         General: Normal range of motion.      Right lower leg: No edema.      Left lower leg: No edema.   Skin:     General: Skin is warm and dry.   Neurological:      Mental Status: She is alert and oriented to person, place, and time.   Psychiatric:         Mood and Affect: Mood normal.         Behavior: Behavior  normal.       DIAGNOSTICS:     Lab Results   Component Value Date    WBC 3.81 02/01/2023    HGB 12.5 02/01/2023    HCT 36.9 02/01/2023    PLT 161 02/01/2023    CHOL 137 02/01/2023    TRIG 92 02/01/2023    HDL 44 02/01/2023    LDL 75 02/01/2023    ALT 26 02/01/2023    AST 28 02/01/2023    NA 135 05/31/2023    K 4.6 05/31/2023    CL 105 05/31/2023    CREAT 1.3 (H) 05/31/2023    BUN 27 (H) 05/31/2023    CO2 25 05/31/2023    TSH 0.93 05/31/2023    INR 1.0 03/10/2021    GLU 119 (H) 05/31/2023    HGBA1C 5.6 03/10/2021    ALKPHOS 64 02/01/2023     No results found.    ASSESSMENT and PLAN:   Anxiety  A/P:  Receives occasional, but rare, as needed dose of alprazolam  for agitation, helps.    Chronic low back pain  A/P:  Mostly resolved since new mattress.    Fall  A/P:  Recent fall - no injuries.    Hypertension  A/P:  No change amlodipine .    Hyperlipidemia  A/P:  Continue Crestor .  Check LP with next labs.    Seizures  A/P:  Still has occasional eye twitching.  No change Briviact  or magnesium .  Refilled magnesium .    Medications Ordered This Encounter         Disp Refills Start End    Magnesium  400 MG Tab 180 tablet 3 08/06/2023 --    Take 1 tablet (400 mg) by mouth 2 (two) times daily - Oral          No orders of the defined types were placed in this encounter.    [name] was seen today for follow-up, eye twitching, fall, hypertension and dizziness.    Diagnoses and all orders for this visit:    Anxiety    Chronic bilateral low back pain without sciatica    Fall, initial encounter  Primary hypertension    Hyperlipidemia, unspecified hyperlipidemia type    Seizures    Other orders  -     Magnesium  400 MG Tab; Take 1 tablet (400 mg) by mouth 2 (two) times daily        Electronically Signed by Slater KATHEE Sequin, NP         [1]   Past Medical History:  Diagnosis Date    Bleeding in head following injury with loss of consciousness     a year ago    Convulsions     Hyperlipidemia     Hypertension     Hypothyroidism     Irregular  heartbeat    [2]   Past Surgical History:  Procedure Laterality Date    AORTIC VALVE REPLACEMENT      first AVR required sternotomy, >10 years ago per patient    BACK SURGERY      HYSTERECTOMY      REPLACEMENT TOTAL KNEE Bilateral     left total, right half knee replacement    TRANSCATHETER AORTIC VALVE REPLACEMENT  2016    5-6 years ago per patient    WRIST SURGERY Left 2017   [3]   Family History  Problem Relation Name Age of Onset    Stroke Mother      Anuerysm Mother      Cancer Father      Heart disease Sister      Heart disease Brother      Intracerebral hemorrhage Neg Hx     [4]   Social History  Tobacco Use    Smoking status: Never    Smokeless tobacco: Never   Vaping Use    Vaping status: Never Used   Substance Use Topics    Alcohol use: Not Currently     Alcohol/week: 3.0 standard drinks of alcohol     Types: 3 Glasses of wine per week     Comment: 1-2 a week    Drug use: Never   [5]   Allergies  Allergen Reactions    Codeine Nausea And Vomiting   [6]   Current Outpatient Medications:     acetaminophen  (TYLENOL ) 650 MG CR tablet, Take 1 tablet (650 mg) by mouth every 8 (eight) hours as needed for Pain (Patient not taking: Reported on 06/29/2023), Disp: 30 tablet, Rfl: 0    ALPRAZolam  (Xanax ) 0.25 MG tablet, Take 1 tablet (0.25 mg) by mouth 2 (two) times daily as needed for Anxiety, Disp: 30 tablet, Rfl: 0    amLODIPine  (NORVASC ) 5 MG tablet, Take 1 tablet (5 mg) by mouth nightly Hold if SBP <100.  May take two 5 mg (10 mg) if SBP > 160., Disp: 90 tablet, Rfl: 3    brivaracetam  (Briviact ) 100 MG Tab tablet, Take 1 tablet (100 mg) by mouth every evening, Disp: 90 tablet, Rfl: 1    Brivaracetam  (BRIVIACT ) 50 MG Tab tablet, Take 1 tablet (50 mg) by mouth every morning, Disp: 90 tablet, Rfl: 1    Efinaconazole  (Jublia ) 10 % Solution, Apply 1 .application topically daily To affected toenails for 48 weeks. (Patient not taking: Reported on 06/29/2023), Disp: 4 mL, Rfl: 6    levothyroxine  (SYNTHROID ) 50 MCG  tablet, Take 1 tablet (50 mcg) by mouth daily, Disp: 90 tablet, Rfl: 3    lidocaine  (LMX) 4 % cream, Apply topically 4 (four) times daily as needed (pain), Disp: 28 g, Rfl: 3    lisinopril  (ZESTRIL ) 10 MG tablet, TAKE 1 TABLET ORALLY ONCE  A DAY EVERY MORNING FOR 30 DAY(S), Disp: 90 tablet, Rfl: 2    Magnesium  400 MG Tab, Take 1 tablet (400 mg) by mouth 2 (two) times daily, Disp: 180 tablet, Rfl: 3    mirtazapine  (REMERON ) 15 MG tablet, Take 1 tablet (15 mg) by mouth nightly, Disp: 90 tablet, Rfl: 0    Multiple Vitamins-Minerals (Centrum Silver  50+Women) Tab, Take 1 tablet by mouth daily, Disp: 100 tablet, Rfl: 3    rosuvastatin  (CRESTOR ) 20 MG tablet, Take 1 tablet (20 mg) by mouth nightly, Disp: 90 tablet, Rfl: 3

## 2023-08-08 ENCOUNTER — Encounter (INDEPENDENT_AMBULATORY_CARE_PROVIDER_SITE_OTHER): Payer: Self-pay | Admitting: Nurse Practitioner

## 2023-08-08 NOTE — Assessment & Plan Note (Signed)
A/P:  Continue Crestor.  Check LP with next labs.

## 2023-08-08 NOTE — Assessment & Plan Note (Signed)
A/P:  Receives occasional, but rare, as needed dose of alprazolam for agitation, helps.

## 2023-08-08 NOTE — Assessment & Plan Note (Signed)
A/P:  Mostly resolved since new mattress.

## 2023-08-08 NOTE — Assessment & Plan Note (Addendum)
A/P:  Still has occasional eye twitching.  No change Briviact or magnesium.  Refilled magnesium.

## 2023-08-08 NOTE — Assessment & Plan Note (Signed)
A/P:  Recent fall - no injuries.

## 2023-08-08 NOTE — Assessment & Plan Note (Signed)
A/P: No change amlodipine.

## 2023-09-08 ENCOUNTER — Ambulatory Visit: Payer: Medicare Other | Attending: Neurology | Admitting: Neurology

## 2023-09-08 ENCOUNTER — Encounter: Payer: Self-pay | Admitting: Neurology

## 2023-09-08 DIAGNOSIS — R569 Unspecified convulsions: Secondary | ICD-10-CM | POA: Insufficient documentation

## 2023-09-08 MED ORDER — BRIVARACETAM 50 MG PO TABS
50.0000 mg | ORAL_TABLET | Freq: Every morning | ORAL | 1 refills | Status: AC
Start: 2023-09-08 — End: ?

## 2023-09-08 MED ORDER — BRIVIACT 100 MG PO TABS
1.0000 | ORAL_TABLET | Freq: Every evening | ORAL | 1 refills | Status: DC
Start: 2023-09-08 — End: 2023-10-15

## 2023-09-08 MED ORDER — VITAMIN B-6 50 MG PO TABS
50.0000 mg | ORAL_TABLET | Freq: Every day | ORAL | 1 refills | Status: DC
Start: 2023-09-08 — End: 2023-10-15

## 2023-09-08 NOTE — Progress Notes (Signed)
 Subjective:       Please see detailed assessment and plan    Patient ID: [name] is a 89 y.o. female visiting from [place]  hx HTN, HLD, hypothyroid, irregular heartbeat, sternotomy for aortic valve replacement >10 years ago then repeat aortic valve replacement 5-6 years ago (procedure sounds like TAVR), intracerebral hemorrhage after fall 2021 [facility] [place], [place] - no residual deficit per daughter), trauma admit 8/15-8/18 for left temporal and smaller rigth temporal hemorrhagic contusion + left greater than right SDH (only right SDH is along tentorial leaflet) + IVH  here for Seizures  .    Current visit:     See detailed assessment / plan for the new developments since last visit and the respective new recommendations.     HPI  Background from Dr. Odis discharge note 02/2021 89 y.o. female visiting from [place]  hx HTN, HLD, hypothyroid, irregular heartbeat, sternotomy for aortic valve replacement >10 years ago then repeat aortic valve replacement 5-6 years ago (procedure sounds like TAVR), intracerebral hemorrhage after fall 2021 [facility] [place], [place] - no residual deficit per daughter), trauma admit 8/15-8/18 for left temporal and smaller rigth temporal hemorrhagic contusion + left greater than right SDH (only right SDH is along tentorial leaflet) + IVH who presents to the hospital with expressive and receptive aphasia. She was doing well after the trauma admit until ~10:30 today when at brunch it was difficult to get her attention even when calling out her name. Later she was found sitting with her head down in her hands and again it was difficult to get her attention. When she spoke there was word finding difficulty and some word salad. She appeared to have expressive and receptive aphasia for me. She denies headache. These symptoms are sudden onset, moderate intensity, without alleviating factors.    Her daughter also noted one episode of right hand shaking last  admission making it difficult for her to use a spoon. Her daughter has noted infrequent right facial twitching which started prior to the trauma admission.    Of note: Once patient was placed on briviact , her coomunication issues were gone.  For about 5 years before she was having right sided facial twitching,  which also stopped after starting Briviact .  Next CT scheduled 10/18/ 2022 with followup with neurosurgery..  Mother usually lives in [place]  by herself and is here now to stay.  Emphasized no driving for at least 6 months per state law.    cvEEG 03/12/2021  IMPRESSION: Abnormal EEG due to:   1. Occasional left temporal intermittent rhythmic delta activity (TIRDA)   2. Left anterior to midtemporal focal slowing    MRI Brain 03/10/2021  IMPRESSION:   1. No acute infarct.   2. Evolution and decreased size of hematoma in the lateral left temporal lobe and smaller hemorrhagic contusion in the right parietal lobe with surrounding edema as detailed. No interval hemorrhage compared to the prior 03/04/2021 MRI  3. Stable left subdural hematoma and sequela of prior subarachnoid hemorrhage as detailed above.  4. Mild global volume loss, chronic small vessel ischemic changes and multiple chronic cerebellar infarcts.     CT head 03/21/2021  IMPRESSION:    1.  Interval resolution of parenchymal hemorrhage associated with a prior left MCA/temporal lobe infarct since prior noncontrast head CT.   2.  Improved subdural hematoma along the left tentorial leaflet and posterior cerebral falx with only trace residual.   3.  No new hemorrhage or evidence  of a new large vascular territory infarct.  4.  Otherwise similar chronic findings, as above, as on prior imaging.    Review of Systems  No other motor, sensory, vision, cognitive, speech, swallow, bowel, or bladder changes; no tremors, convulsions, seizure activity, or loss of consciousness episodes.  All other systems were reviewed and were negative.     Current Outpatient Medications  on File Prior to Visit   Medication Sig Dispense Refill    acetaminophen  (TYLENOL ) 650 MG CR tablet Take 1 tablet (650 mg) by mouth every 8 (eight) hours as needed for Pain 30 tablet 0    ALPRAZolam  (Xanax ) 0.25 MG tablet Take 1 tablet (0.25 mg) by mouth 2 (two) times daily as needed for Anxiety 30 tablet 0    amLODIPine  (NORVASC ) 5 MG tablet Take 1 tablet (5 mg) by mouth nightly Hold if SBP <100.  May take two 5 mg (10 mg) if SBP > 160. 90 tablet 3    levothyroxine  (SYNTHROID ) 50 MCG tablet Take 1 tablet (50 mcg) by mouth daily 90 tablet 3    lidocaine  (LMX) 4 % cream Apply topically 4 (four) times daily as needed (pain) 28 g 3    lisinopril  (ZESTRIL ) 10 MG tablet TAKE 1 TABLET ORALLY ONCE A DAY EVERY MORNING FOR 30 DAY(S) 90 tablet 2    Magnesium  400 MG Tab Take 1 tablet (400 mg) by mouth 2 (two) times daily 180 tablet 3    mirtazapine  (REMERON ) 15 MG tablet Take 1 tablet (15 mg) by mouth nightly 90 tablet 0    Multiple Vitamins-Minerals (Centrum Silver  50+Women) Tab Take 1 tablet by mouth daily 100 tablet 3    rosuvastatin  (CRESTOR ) 20 MG tablet Take 1 tablet (20 mg) by mouth nightly 90 tablet 3    [DISCONTINUED] brivaracetam  (Briviact ) 100 MG Tab tablet Take 1 tablet (100 mg) by mouth every evening 90 tablet 1    [DISCONTINUED] Brivaracetam  (BRIVIACT ) 50 MG Tab tablet Take 1 tablet (50 mg) by mouth every morning 90 tablet 1    Efinaconazole  (Jublia ) 10 % Solution Apply 1 .application topically daily To affected toenails for 48 weeks. 4 mL 6     No current facility-administered medications on file prior to visit.     Current/Home Medications    ACETAMINOPHEN  (TYLENOL ) 650 MG CR TABLET    Take 1 tablet (650 mg) by mouth every 8 (eight) hours as needed for Pain    ALPRAZOLAM  (XANAX ) 0.25 MG TABLET    Take 1 tablet (0.25 mg) by mouth 2 (two) times daily as needed for Anxiety    AMLODIPINE  (NORVASC ) 5 MG TABLET    Take 1 tablet (5 mg) by mouth nightly Hold if SBP <100.  May take two 5 mg (10 mg) if SBP > 160.     EFINACONAZOLE  (JUBLIA ) 10 % SOLUTION    Apply 1 .application topically daily To affected toenails for 48 weeks.    LEVOTHYROXINE  (SYNTHROID ) 50 MCG TABLET    Take 1 tablet (50 mcg) by mouth daily    LIDOCAINE  (LMX) 4 % CREAM    Apply topically 4 (four) times daily as needed (pain)    LISINOPRIL  (ZESTRIL ) 10 MG TABLET    TAKE 1 TABLET ORALLY ONCE A DAY EVERY MORNING FOR 30 DAY(S)    MAGNESIUM  400 MG TAB    Take 1 tablet (400 mg) by mouth 2 (two) times daily    MIRTAZAPINE  (REMERON ) 15 MG TABLET    Take 1 tablet (15 mg) by  mouth nightly    MULTIPLE VITAMINS-MINERALS (CENTRUM SILVER  50+WOMEN) TAB    Take 1 tablet by mouth daily    ROSUVASTATIN  (CRESTOR ) 20 MG TABLET    Take 1 tablet (20 mg) by mouth nightly     Allergies   Allergen Reactions    Codeine Nausea And Vomiting      Patient Active Problem List    Diagnosis Date Noted    Flu vaccine need [Z23] 05/23/2023    Foot pain, bilateral [M79.671, M79.672] 01/29/2023    Pedal edema [R60.0] 01/29/2023    Chronic right shoulder pain [M25.511, G89.29] 10/03/2022    Right hand and foot pain [M79.641, M79.671] 10/03/2022    Anxiety [F41.9] 10/03/2022    Chronic low back pain [M54.50, G89.29] 06/27/2022    Fall [W19.XXXA] 02/01/2022    Diarrhea [R19.7] 02/01/2022    Syncope [R55] 11/25/2021    Onychomycosis [B35.1] 10/29/2021    Neck pain, bilateral posterior [M54.2] 09/11/2021    Loss of appetite [R63.0] 08/14/2021    Fatigue [R53.83] 08/14/2021    Seizures [R56.9] 08/14/2021    Traumatic subdural hemorrhage with loss of consciousness of unspecified duration, initial encounter [S06.5X9A] 08/06/2021    Situational depression [F43.21] 07/13/2021    Toe pain, left [M79.675] 05/07/2021    Constipation [K59.00] 05/07/2021    Hypertension [I10] 05/07/2021    Hyperlipidemia [E78.5] 05/07/2021    Palpitations [R00.2] 04/21/2021    S/p TAVR (transcatheter aortic valve replacement), 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] 04/18/2021    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] 04/18/2021    Hypothyroidism  [E03.9] 04/18/2021    Osteoporosis [M81.0] 04/18/2021    Gait abnormality [R26.9] 04/18/2021    Cerebral edema [G93.6] 03/10/2021    Left temporal and right parietal hemorrhagic contusions on MRI 03/04/21 [I61.9] 03/10/2021    Subdural hematoma [S06.5XAA] 03/10/2021    Hyponatremia [E87.1] 03/10/2021    Aphasia [R47.01] 03/09/2021    Nontraumatic acute subdural hemorrhage [I62.01] 03/03/2021     Past Surgical History:   Procedure Laterality Date    AORTIC VALVE REPLACEMENT      first AVR required sternotomy, >10 years ago per patient    BACK SURGERY      HYSTERECTOMY      REPLACEMENT TOTAL KNEE Bilateral     left total, right half knee replacement    TRANSCATHETER AORTIC VALVE REPLACEMENT  2016    5-6 years ago per patient    WRIST SURGERY Left 2017     Social History     Socioeconomic History    Marital status: Widowed   Tobacco Use    Smoking status: Never    Smokeless tobacco: Never   Vaping Use    Vaping status: Never Used   Substance and Sexual Activity    Alcohol use: Not Currently     Alcohol/week: 3.0 standard drinks of alcohol     Types: 3 Glasses of wine per week     Comment: 1-2 a week    Drug use: Never    Sexual activity: Not Currently     Social Drivers of Health     Financial Resource Strain: Low Risk  (01/15/2023)    Overall Financial Resource Strain (CARDIA)     Difficulty of Paying Living Expenses: Not hard at all   Food Insecurity: No Food Insecurity (01/15/2023)    Hunger Vital Sign     Worried About Running Out of Food in the Last Year: Never true     Ran Out of Food in the Last Year: Never true  Transportation Needs: No Transportation Needs (01/15/2023)    PRAPARE - Therapist, Art (Medical): No     Lack of Transportation (Non-Medical): No   Physical Activity: Sufficiently Active (01/15/2023)    Exercise Vital Sign     Days of Exercise per Week: 5 days     Minutes of Exercise per Session: 40 min   Stress: No Stress Concern Present (01/15/2023)    [employer] of  Occupational Health - Occupational Stress Questionnaire     Feeling of Stress : Not at all   Social Connections: Moderately Integrated (01/15/2023)    Social Connection and Isolation Panel [NHANES]     Frequency of Communication with Friends and Family: More than three times a week     Frequency of Social Gatherings with Friends and Family: More than three times a week     Attends Religious Services: More than 4 times per year     Active Member of [employer] or Organizations: Yes     Attends Banker Meetings: More than 4 times per year     Marital Status: Widowed   Intimate Partner Violence: Not At Risk (01/15/2023)    Humiliation, Afraid, Rape, and Kick questionnaire     Fear of Current or Ex-Partner: No     Emotionally Abused: No     Physically Abused: No     Sexually Abused: No   Housing Stability: Low Risk  (01/15/2023)    Housing Stability Vital Sign     Unable to Pay for Housing in the Last Year: No     Number of Places Lived in the Last Year: 2     Unstable Housing in the Last Year: No     Family History   Problem Relation Name Age of Onset    Stroke Mother      Anuerysm Mother      Cancer Father      Heart disease Sister      Heart disease Brother      Intracerebral hemorrhage Neg Hx       She  has a past medical history of Bleeding in head following injury with loss of consciousness, Convulsions, Hyperlipidemia, Hypertension, Hypothyroidism, and Irregular heartbeat.        Objective:      Physical Exam Neurologic Exam    Vital Signs:  Reviewed    General: Well developed and well nourished. No acute distress. Cooperative with the exam  ENT: Normal oral mucosa, no ear or nose discharge  Neck: Symmetric, no deformities  CV: RRR  Resp: No audible wheezing, normal work of breathing  Abd: Soft, nondistended  Skin: Intact, extremities normal in color  Psych: Affect is normal, good insight    Mental Status: The patient is awake, alert and oriented to person, place, and time.  Affect is normal  Fund of  knowledge appropriate  Recent and remote memory are intact   Attention span and concentration appear normal.  Language function is normal. There is no evidence of aphasia in conversational speech.    Cranial nerves:   -CN II: Visual fields full to bedside confrontation   -CN III, IV, VI: Pupils equal, round, and reactive to light; extraocular movements intact; no ptosis              -CN V: Facial sensation intact in V1 through V3 distributions   -CN VII: Face symmetric   -CN VIII: Hearing intact to conversational speech   -CN IX,  X: Palate elevates symmetrically; normal phonation   -CN XI: Symmetric full strength of sternocleidomastoid and trapezius muscles   -CN XII: Tongue protrudes midline    Motor: Muscle tone normal without spasticity or flaccidity. No atrophy.  No pronator drift.     UEs:   Deltoid Bicep Tricep WE WF Grip IO   Right 5 5 5 5 5 5 5    Left 5 5 5 5 5 5 5      LEs   HF HE KF KE PF DF   Right 5 5 5 5 5 5    Left 5 5 5 5 5 5      Sensory:   Light touch intact.  Temperature intact.  Vibration intact.    Reflexes:      B T BR P A   Right 2 2 2 2 2    Left 2 2 2 2 2      Plantars:    Coordination: FTN intact, no truncal ataxia. RAMs intact. 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 female visiting from [place]  hx HTN, HLD, hypothyroid, irregular heartbeat, sternotomy for aortic valve replacement >10 years ago then repeat aortic valve replacement 5-6 years ago (procedure sounds like TAVR), intracerebral hemorrhage after fall 2021 [facility] [place], [place] -  no residual deficit per daughter), trauma admit 8/15-8/18 for left temporal and smaller rigth temporal hemorrhagic contusion + left greater than right SDH (only right SDH is along tentorial leaflet) + IVH  here for Seizures    08/06/2021 visit:  Patient and family deny seizures since last visit.  Patient has been having eye twitching that has been coming and going.  Initially the Briviact  helped with the eye twitching then eye twitching returned then went away again.  When asked about PT for gait disturbance, it was noted that patient did not start PT.  Patient went to [place]  for a lengthy visit and while her daughter noted that the patient's balance continues to be poor, the daughter noted that patient's shoes are very large and slide around on her.  Daughter reported that she will get the patient thicker socks.  Daughter reports that the patient is pretty steady using her walker, however, does not use her walker at home and uses a wheelchair when out for longer trips.    10/10/21 visit:  reduced briviact  helped a lot. Will add magnesium  400. No seizures or auras since last visit. No ER visits. Compliant with medications. Tolerating well.     04/10/22 visit: No seizures or auras since last visit. No ER visits. Compliant with medications. Tolerating well. Compliance is good.     10/07/22 visit: No seizures or auras since last visit. No ER visits. Compliant with medications. Tolerating well. Compliance is good.     03/10/2023 visit: Had episode of eye twitching but adding magnesium  to help her.  Otherwise,     09/09/23 visit: No seizures or auras since last visit. No ER visits. Compliant with  medications. Tolerating well. Compliance is good. Again readdressed/ discussed the need for AEDs continuation/ duration of medication. Refills sent to the requested pharmacy. Will follow up 6 monthly intervals. She will approach me through my chart if any concerns or questions in the mean time.    Time spent today (including but not  limited to EEG review, neuroimaging review, previous clinical notes reviewed, discussion with the patient regarding the plan and documentation)  = 40 minutes    Currently Pertinent h/o negative for diplopia, dysphagia, dysarthria, facial drooping. Facial numbness. Focal numbness, focal weakness. Neck trauma, back trauma,radicular pain, gait disturbances, LOC episodes, LOA episodes, falls, bowel bladder disturbances.    More than 50% of this office visit was spent counseling the patient on one or more of the following:   Disease state - discussion about the medical condition, diagnostic and treatment options   Medications - indications and side effects   Lifestyle issues as appropriate.       Recommend:  Continue magnesium  400.   Continue  Briviact  to 50 mg in Am and 100 mg in PM refills sent to pharmacy    Continue to participate socially, physically and cognitively as much he can to improve brain health  Refills sent the pharmacy as requested:   Continue Seizure precautions, avoid seizure triggers like missing seizure meds, lack of sleep, too much physical/cognitive stress, driving restrictions per state law ( in [place], be aware not to drive for six months from last seizure with LOC/LOA).   Advised patient to use Mychart for any questions in the interm    Return to clinic in 6 months : you can schedule the appt by calling [phone]               Plan:      No orders of the defined types were placed in this encounter.    Medications Ordered This Encounter         Disp Refills Start End    brivaracetam  (Briviact ) 100 MG Tab tablet 90 tablet 1 09/08/2023 --    Take 1 tablet (100 mg) by mouth every evening - Oral    Brivaracetam  (BRIVIACT ) 50 MG Tab tablet 90 tablet 1 09/08/2023 --    Take 1 tablet (50 mg) by mouth every morning - Oral        All relevant and clinical information was transcribed by me, Arlean HERO. Renella, NP, acting as a scribe for Dr. Malahki Gasaway.    Agree     Additional notes and data scanned  including patient questionnaire which may contain pertinent information to visit. Patient can follow up sooner if needed. In the meantime, patient will contact the office with any questions or concerns.     Total of 40 minutes were spent on the day of service including face-to-face time with patient, coordinating care, record review and documentation, explaining the natural history of seizures, types of seizures, triggers for seizures.  Need for the necessary testing.  EEG and MRI.  Need for a seizure medication compliance for medications.  Seizure precautions including avoid driving per [place]  state law.  6 months from the last seizure.  Side effects of seizure medications.  Avoiding triggers for seizures.  Backup plan, and the time of partial seizure and convulsive seizures.  Including going to the emergency room.  Taking emergency rescue medications.           Rober Cinnamon, MD. FAES    Director, [facility]  Assistant Professor,  [facility]  Board Certified,Neurology  Board Certified, Clinical Neurophysiology    [email or web address]      This note was generated by the Epic EMR system/ Dragon speech recognition and may contain inherent errors or omissions not intended by the user. Grammatical errors, random word insertions, deletions, pronoun errors and incomplete sentences are occasional consequences of this technology due to software limitations. Not all errors are caught or corrected.Although every attempt is made to root out erroneus and incomplete transcription, the note may still not fully represent the intent or opinion of the author. If there are questions or concerns about the content of this note or information contained within the body of this dictation they should be addressed directly with the author for clarification.*

## 2023-10-13 ENCOUNTER — Encounter (INDEPENDENT_AMBULATORY_CARE_PROVIDER_SITE_OTHER): Payer: Self-pay | Admitting: Nurse Practitioner

## 2023-10-15 ENCOUNTER — Ambulatory Visit: Admitting: Nurse Practitioner

## 2023-10-15 ENCOUNTER — Encounter (INDEPENDENT_AMBULATORY_CARE_PROVIDER_SITE_OTHER): Payer: Self-pay | Admitting: Nurse Practitioner

## 2023-10-15 VITALS — BP 110/62 | HR 68 | Temp 97.0°F | Resp 20

## 2023-10-15 DIAGNOSIS — F32A Depression, unspecified: Secondary | ICD-10-CM

## 2023-10-15 DIAGNOSIS — E785 Hyperlipidemia, unspecified: Secondary | ICD-10-CM

## 2023-10-15 DIAGNOSIS — F419 Anxiety disorder, unspecified: Secondary | ICD-10-CM

## 2023-10-15 DIAGNOSIS — R569 Unspecified convulsions: Secondary | ICD-10-CM

## 2023-10-15 DIAGNOSIS — E039 Hypothyroidism, unspecified: Secondary | ICD-10-CM

## 2023-10-15 DIAGNOSIS — G8929 Other chronic pain: Secondary | ICD-10-CM

## 2023-10-15 DIAGNOSIS — I10 Essential (primary) hypertension: Secondary | ICD-10-CM

## 2023-10-15 DIAGNOSIS — B351 Tinea unguium: Secondary | ICD-10-CM

## 2023-10-15 DIAGNOSIS — F4321 Adjustment disorder with depressed mood: Secondary | ICD-10-CM

## 2023-10-15 DIAGNOSIS — M545 Low back pain, unspecified: Secondary | ICD-10-CM

## 2023-10-15 DIAGNOSIS — R5383 Other fatigue: Secondary | ICD-10-CM

## 2023-10-15 MED ORDER — BRIVIACT 100 MG PO TABS
1.0000 | ORAL_TABLET | Freq: Every evening | ORAL | 1 refills | Status: DC
Start: 2023-10-15 — End: 2024-03-29

## 2023-10-15 MED ORDER — VITAMIN B-6 50 MG PO TABS
50.0000 mg | ORAL_TABLET | Freq: Every day | ORAL | 1 refills | Status: AC
Start: 2023-10-15 — End: ?

## 2023-10-15 MED ORDER — LISINOPRIL 10 MG PO TABS: ORAL_TABLET | ORAL | 2 refills | Status: DC

## 2023-10-15 MED ORDER — ALPRAZOLAM 0.25 MG PO TABS: 0.2500 mg | ORAL_TABLET | Freq: Two times a day (BID) | ORAL | 0 refills | Status: DC | PRN

## 2023-10-15 MED ORDER — MIRTAZAPINE 15 MG PO TABS
15.0000 mg | ORAL_TABLET | Freq: Every evening | ORAL | 0 refills | Status: AC
Start: 2023-10-15 — End: 2024-01-13

## 2023-10-15 NOTE — Assessment & Plan Note (Addendum)
 A/P:  Stable on amlodipine and lisinopril.  Refilled lisinopril.  Continue adequate fluid intake and change position slowly. Check labs when she returns from [place] .

## 2023-10-15 NOTE — Assessment & Plan Note (Signed)
 A/P:  Stable on Briviact.  Follow-up with neuro in six months.

## 2023-10-15 NOTE — Assessment & Plan Note (Signed)
 A/P:  Continue mirtazapine.  Refilled.

## 2023-10-15 NOTE — Assessment & Plan Note (Signed)
 A/P:  No change Crestor.  Will check lipid panel when she returns from [place] .

## 2023-10-15 NOTE — Assessment & Plan Note (Signed)
 A/P:  Continue using analgesic cream as needed.

## 2023-10-15 NOTE — Assessment & Plan Note (Signed)
 A/P:  Advised to stop filing toenails so short to avoid infection.  Agreed.

## 2023-10-15 NOTE — Progress Notes (Signed)
 Date: 10/15/2023    Patient Name: [name],[name]    Patient was seen in their home (POS 12) in lieu of an office visit for the following reason:   Requires use of an assistive device in order to ambulate due to ambulatory dysfunction.      Code Status: DNAR    HPI:   [name] is a 89 y.o. F that I am here to see for a primary care follow-up visit.  [name], [name], was present and supplemented her mother's information.    [name] reported she is leaving for [place]  10/28/23 and will return 12/08/2023.  Is very excited about going.      Saw Dr. Genelle, neurology, last month.  No changes made in Briviact  and continues to takes magnesium  twice a day.  Rarely has eye twitching.  To f/u again in six months.    Back pain improved with new mattress.  Still occurs but not nearly as often.  Uses OTC topical analgesic cream which helps.    Problem   Anxiety   Chronic Low Back Pain   Onychomycosis    No longer using Jublia  10%.  Filing nail beds too short.     Fatigue    Associated with mirtazapine  and depression.  Vit B-6 helping.     Seizures (Cms/Hcc)   Situational Depression    Mood stable on mirtazapine .       Hypertension    BP 110/62, P 68 at time of visit. No CP, SOB, edema.  Occasionally dizziness when getting out of bed.  Drinking Pedialyte     Hyperlipidemia   Hypothyroidism    TSH 0.38 02/01/23 at which point levothyroxine  was reduced from 88 mcg to 50 mcg daily.       TSH 0.93, 05/31/23, on levothyroxine  50 mcg     Flu Vaccine Need (Resolved)   Foot Pain, Bilateral (Resolved)   Chronic Right Shoulder Pain (Resolved)    C/o right shoulder pain which started after her fall two years ago.  Intermittent pain, only with certain movements or rolls over in bed.  Limited ROM and function.  Has prn acetaminophen  and takes an Aleve every 2-3 weeks as needed.       Toe Pain, Left (Resolved)    Continues to have left foot toe pain (#4 & 5; previously was #3 & 4).  Saw podiatrist in [place] but did not  recieve cortisone injection.   Using diclofenac on toes at bedtime which helps.  Would like a podiatrist in [place].     Aphasia (Resolved)     Review of Systems   Constitutional:  Negative for activity change and appetite change.   Respiratory:  Negative for cough and shortness of breath.    Cardiovascular:  Negative for chest pain and leg swelling.   Gastrointestinal:  Negative for constipation, diarrhea, nausea and vomiting.   Musculoskeletal:  Positive for back pain.   Neurological:  Positive for dizziness.   Psychiatric/Behavioral:  Negative for sleep disturbance.      Medical History[1]  Past Surgical History[2]  Family History[3]  Social History[4]  Allergies[5]    MEDICATIONS:   Current Medications[6]    PHYSICAL EXAM:   BP 110/62   Pulse 68   Temp 97 F (36.1 C)   Resp 20   SpO2 96%    Wt Readings from Last 1 Encounters:   09/08/23 71.2 kg (157 lb)      Ht Readings from Last 1 Encounters:   09/08/23  1.575 m (5' 2)     Physical Exam  Constitutional:       General: She is not in acute distress.     Appearance: She is not ill-appearing or toxic-appearing.   Cardiovascular:      Rate and Rhythm: Normal rate and regular rhythm.      Pulses: Normal pulses.      Heart sounds: Normal heart sounds.   Pulmonary:      Effort: Pulmonary effort is normal.      Breath sounds: Normal breath sounds.   Abdominal:      General: Bowel sounds are normal. There is no distension.      Palpations: Abdomen is soft.      Tenderness: There is no abdominal tenderness. There is no guarding.   Musculoskeletal:      Right lower leg: No edema.      Left lower leg: No edema.   Skin:     General: Skin is warm and dry.      Comments: Great and toe 2 on right foot filed too short - bed and nail length   Neurological:      Mental Status: She is alert and oriented to person, place, and time.   Psychiatric:         Mood and Affect: Mood normal.       DIAGNOSTICS:     Lab Results   Component Value Date    WBC 3.81 02/01/2023    HGB 12.5  02/01/2023    HCT 36.9 02/01/2023    PLT 161 02/01/2023    CHOL 137 02/01/2023    TRIG 92 02/01/2023    HDL 44 02/01/2023    LDL 75 02/01/2023    ALT 26 02/01/2023    AST 28 02/01/2023    NA 135 05/31/2023    K 4.6 05/31/2023    CL 105 05/31/2023    CREAT 1.3 (H) 05/31/2023    BUN 27 (H) 05/31/2023    CO2 25 05/31/2023    TSH 0.93 05/31/2023    INR 1.0 03/10/2021    GLU 119 (H) 05/31/2023    HGBA1C 5.6 03/10/2021    ALKPHOS 64 02/01/2023     No results found.    ASSESSMENT and PLAN:   Anxiety  A/P:  Stable on alprazolam  as needed.  Refilled.    Chronic low back pain  A/P:  Continue using analgesic cream as needed.    Hypertension  A/P:  Stable on amlodipine  and lisinopril .  Refilled lisinopril .  Continue adequate fluid intake and change position slowly. Check labs when she returns from [place] .      Hyperlipidemia  A/P:  No change Crestor .  Will check lipid panel when she returns from [place] .    Hypothyroidism  A/P:  Continue levothyroxine .  Check TSH when returns from [place] .    Seizures  A/P:  Stable on Briviact .  Follow-up with neuro in six months.    Onychomycosis  A/P:  Advised to stop filing toenails so short to avoid infection.  Agreed.    Situational depression  A/P:  Continue mirtazapine .  Refilled.    Fatigue  A/P:  Refilled Vitamin B-6.    Medications Ordered This Encounter         Disp Refills Start End    lisinopril  (ZESTRIL ) 10 MG tablet 90 tablet 2 10/15/2023 --    TAKE 1 TABLET ORALLY ONCE A DAY EVERY MORNING FOR 30 DAY(S)    vitamin B-6 (PYRIDOXINE) 50 MG tablet  90 tablet 1 10/15/2023 --    Take 1 tablet (50 mg) by mouth daily - Oral    brivaracetam  (Briviact ) 100 MG Tab tablet 90 tablet 1 10/15/2023 --    Take 1 tablet (100 mg) by mouth every evening - Oral    mirtazapine  (REMERON ) 15 MG tablet 90 tablet 0 10/15/2023 01/13/2024    Take 1 tablet (15 mg) by mouth nightly - Oral    ALPRAZolam  (Xanax ) 0.25 MG tablet 30 tablet 0 10/15/2023 --    Take 1 tablet (0.25 mg) by mouth 2 (two) times daily as needed for  Anxiety - Oral          No orders of the defined types were placed in this encounter.    [name] was seen today for follow-up, hypertension, hypothyroidism, seizures and back pain.    Diagnoses and all orders for this visit:    Hypertension, unspecified type  -     lisinopril  (ZESTRIL ) 10 MG tablet; TAKE 1 TABLET ORALLY ONCE A DAY EVERY MORNING FOR 30 DAY(S)    Seizure (CMS/HCC)  -     brivaracetam  (Briviact ) 100 MG Tab tablet; Take 1 tablet (100 mg) by mouth every evening    Anxiety    Chronic bilateral low back pain without sciatica    Hyperlipidemia, unspecified hyperlipidemia type    Hypothyroidism, unspecified type    Seizures (CMS/HCC)    Onychomycosis    Situational depression    Fatigue due to depression    Other orders  -     vitamin B-6 (PYRIDOXINE) 50 MG tablet; Take 1 tablet (50 mg) by mouth daily  -     mirtazapine  (REMERON ) 15 MG tablet; Take 1 tablet (15 mg) by mouth nightly  -     ALPRAZolam  (Xanax ) 0.25 MG tablet; Take 1 tablet (0.25 mg) by mouth 2 (two) times daily as needed for Anxiety        Electronically Signed by Slater KATHEE Sequin, NP         [1]   Past Medical History:  Diagnosis Date    Bleeding in head following injury with loss of consciousness (CMS/HCC)     a year ago    Convulsions (CMS/HCC)     Hyperlipidemia     Hypertension     Hypothyroidism     Irregular heartbeat    [2]   Past Surgical History:  Procedure Laterality Date    AORTIC VALVE REPLACEMENT      first AVR required sternotomy, >10 years ago per patient    BACK SURGERY      HYSTERECTOMY      REPLACEMENT TOTAL KNEE Bilateral     left total, right half knee replacement    TRANSCATHETER AORTIC VALVE REPLACEMENT  2016    5-6 years ago per patient    WRIST SURGERY Left 2017   [3]   Family History  Problem Relation Name Age of Onset    Stroke Mother      Anuerysm Mother      Cancer Father      Heart disease Sister      Heart disease Brother      Intracerebral hemorrhage Neg Hx     [4]   Social History  Tobacco Use    Smoking status:  Never    Smokeless tobacco: Never   Vaping Use    Vaping status: Never Used   Substance Use Topics    Alcohol use: Not Currently     Alcohol/week: 3.0  standard drinks of alcohol     Types: 3 Glasses of wine per week     Comment: 1-2 a week    Drug use: Never   [5]   Allergies  Allergen Reactions    Codeine Nausea And Vomiting   [6]   Current Outpatient Medications:     acetaminophen  (TYLENOL ) 650 MG CR tablet, Take 1 tablet (650 mg) by mouth every 8 (eight) hours as needed for Pain, Disp: 30 tablet, Rfl: 0    ALPRAZolam  (Xanax ) 0.25 MG tablet, Take 1 tablet (0.25 mg) by mouth 2 (two) times daily as needed for Anxiety, Disp: 30 tablet, Rfl: 0    amLODIPine  (NORVASC ) 5 MG tablet, Take 1 tablet (5 mg) by mouth nightly Hold if SBP <100.  May take two 5 mg (10 mg) if SBP > 160., Disp: 90 tablet, Rfl: 3    brivaracetam  (Briviact ) 100 MG Tab tablet, Take 1 tablet (100 mg) by mouth every evening, Disp: 90 tablet, Rfl: 1    Brivaracetam  (BRIVIACT ) 50 MG Tab tablet, Take 1 tablet (50 mg) by mouth every morning, Disp: 90 tablet, Rfl: 1    Efinaconazole  (Jublia ) 10 % Solution, Apply 1 .application topically daily To affected toenails for 48 weeks., Disp: 4 mL, Rfl: 6    levothyroxine  (SYNTHROID ) 50 MCG tablet, Take 1 tablet (50 mcg) by mouth daily, Disp: 90 tablet, Rfl: 3    lidocaine  (LMX) 4 % cream, Apply topically 4 (four) times daily as needed (pain), Disp: 28 g, Rfl: 3    lisinopril  (ZESTRIL ) 10 MG tablet, TAKE 1 TABLET ORALLY ONCE A DAY EVERY MORNING FOR 30 DAY(S), Disp: 90 tablet, Rfl: 2    Magnesium  400 MG Tab, Take 1 tablet (400 mg) by mouth 2 (two) times daily, Disp: 180 tablet, Rfl: 3    mirtazapine  (REMERON ) 15 MG tablet, Take 1 tablet (15 mg) by mouth nightly, Disp: 90 tablet, Rfl: 0    Multiple Vitamins-Minerals (Centrum Silver  50+Women) Tab, Take 1 tablet by mouth daily, Disp: 100 tablet, Rfl: 3    rosuvastatin  (CRESTOR ) 20 MG tablet, Take 1 tablet (20 mg) by mouth nightly, Disp: 90 tablet, Rfl: 3    vitamin B-6  (PYRIDOXINE) 50 MG tablet, Take 1 tablet (50 mg) by mouth daily, Disp: 90 tablet, Rfl: 1

## 2023-10-15 NOTE — Assessment & Plan Note (Signed)
 A/P:  Refilled Vitamin B-6.

## 2023-10-15 NOTE — Assessment & Plan Note (Signed)
 A/P:  Stable on alprazolam as needed.  Refilled.

## 2023-10-15 NOTE — Assessment & Plan Note (Signed)
 A/P:  Continue levothyroxine.  Check TSH when returns from [place] .

## 2023-10-18 ENCOUNTER — Other Ambulatory Visit (INDEPENDENT_AMBULATORY_CARE_PROVIDER_SITE_OTHER): Payer: Self-pay

## 2023-10-18 MED ORDER — LEVOTHYROXINE SODIUM 50 MCG PO TABS
50.0000 ug | ORAL_TABLET | Freq: Every day | ORAL | 3 refills | Status: AC
Start: 2023-10-18 — End: 2024-10-12

## 2023-10-22 ENCOUNTER — Encounter (INDEPENDENT_AMBULATORY_CARE_PROVIDER_SITE_OTHER): Payer: Medicare Other | Admitting: Nurse Practitioner

## 2023-10-29 ENCOUNTER — Encounter (INDEPENDENT_AMBULATORY_CARE_PROVIDER_SITE_OTHER): Payer: Medicare Other | Admitting: Nurse Practitioner

## 2023-12-15 ENCOUNTER — Ambulatory Visit: Attending: Nurse Practitioner

## 2023-12-15 DIAGNOSIS — I10 Essential (primary) hypertension: Secondary | ICD-10-CM | POA: Insufficient documentation

## 2023-12-15 DIAGNOSIS — R569 Unspecified convulsions: Secondary | ICD-10-CM | POA: Insufficient documentation

## 2023-12-15 DIAGNOSIS — E039 Hypothyroidism, unspecified: Secondary | ICD-10-CM | POA: Insufficient documentation

## 2023-12-15 DIAGNOSIS — E785 Hyperlipidemia, unspecified: Secondary | ICD-10-CM | POA: Insufficient documentation

## 2023-12-15 LAB — LAB USE ONLY - CBC WITH DIFFERENTIAL
Absolute Basophils: 0.01 x10 3/uL (ref 0.00–0.08)
Absolute Eosinophils: 0.08 x10 3/uL (ref 0.00–0.44)
Absolute Immature Granulocytes: 0.02 x10 3/uL (ref 0.00–0.07)
Absolute Lymphocytes: 0.77 x10 3/uL (ref 0.42–3.22)
Absolute Monocytes: 0.43 x10 3/uL (ref 0.21–0.85)
Absolute Neutrophils: 3.19 x10 3/uL (ref 1.10–6.33)
Absolute nRBC: 0 x10 3/uL (ref <=0.00)
Basophils %: 0.2 %
Eosinophils %: 1.8 %
Hematocrit: 39.5 % (ref 34.7–43.7)
Hemoglobin: 13 g/dL (ref 11.4–14.8)
Immature Granulocytes %: 0.4 %
Lymphocytes %: 17.1 %
MCH: 31.6 pg (ref 25.1–33.5)
MCHC: 32.9 g/dL (ref 31.5–35.8)
MCV: 95.9 fL (ref 78.0–96.0)
MPV: 10.2 fL (ref 8.9–12.5)
Monocytes %: 9.6 %
Neutrophils %: 70.9 %
Platelet Count: 207 x10 3/uL (ref 142–346)
Preliminary Absolute Neutrophil Count: 3.19 x10 3/uL (ref 1.10–6.33)
RBC: 4.12 x10 6/uL (ref 3.90–5.10)
RDW: 14 % (ref 11–15)
WBC: 4.5 x10 3/uL (ref 3.10–9.50)
nRBC %: 0 /100{WBCs} (ref <=0.0)

## 2023-12-16 LAB — COMPREHENSIVE METABOLIC PANEL
ALT: 42 U/L (ref <=55)
AST (SGOT): 43 U/L — ABNORMAL HIGH (ref <=41)
Albumin/Globulin Ratio: 1.2 (ref 0.9–2.2)
Albumin: 4.1 g/dL (ref 3.5–5.0)
Alkaline Phosphatase: 59 U/L (ref 37–117)
Anion Gap: 10 (ref 5.0–15.0)
BUN: 28 mg/dL — ABNORMAL HIGH (ref 7–21)
Bilirubin, Total: 0.3 mg/dL (ref 0.2–1.2)
CO2: 23 meq/L (ref 17–29)
Calcium: 8.9 mg/dL (ref 7.9–10.2)
Chloride: 102 meq/L (ref 99–111)
Creatinine: 0.9 mg/dL (ref 0.4–1.0)
GFR: 58.9 mL/min/1.73 m2 — ABNORMAL LOW (ref >=60.0)
Globulin: 3.4 g/dL (ref 2.0–3.6)
Glucose: 89 mg/dL (ref 70–100)
Potassium: 4.5 meq/L (ref 3.5–5.3)
Protein, Total: 7.5 g/dL (ref 6.0–8.3)
Sodium: 135 meq/L (ref 135–145)

## 2023-12-16 LAB — LIPID PANEL
Cholesterol / HDL Ratio: 3 {index}
Cholesterol: 154 mg/dL (ref <=199)
HDL: 51 mg/dL
LDL Calculated: 78 mg/dL (ref 0–99)
Triglycerides: 125 mg/dL
VLDL Calculated: 25 mg/dL (ref 10–40)

## 2023-12-16 LAB — TSH: TSH: 5.86 u[IU]/mL — ABNORMAL HIGH (ref 0.35–4.94)

## 2023-12-16 LAB — MAGNESIUM: Magnesium: 2.4 mg/dL (ref 1.6–2.6)

## 2023-12-24 ENCOUNTER — Encounter (INDEPENDENT_AMBULATORY_CARE_PROVIDER_SITE_OTHER): Payer: Self-pay | Admitting: Nurse Practitioner

## 2023-12-24 ENCOUNTER — Ambulatory Visit: Admitting: Nurse Practitioner

## 2023-12-24 VITALS — BP 124/68 | HR 67 | Temp 96.5°F | Resp 18

## 2023-12-24 DIAGNOSIS — R600 Localized edema: Secondary | ICD-10-CM

## 2023-12-24 DIAGNOSIS — B351 Tinea unguium: Secondary | ICD-10-CM

## 2023-12-24 DIAGNOSIS — F419 Anxiety disorder, unspecified: Secondary | ICD-10-CM

## 2023-12-24 DIAGNOSIS — F329 Major depressive disorder, single episode, unspecified: Secondary | ICD-10-CM

## 2023-12-24 DIAGNOSIS — R569 Unspecified convulsions: Secondary | ICD-10-CM

## 2023-12-24 MED ORDER — BRIVARACETAM 50 MG PO TABS
50.0000 mg | ORAL_TABLET | Freq: Every morning | ORAL | 2 refills | Status: DC
Start: 2023-12-24 — End: 2024-03-29

## 2023-12-24 MED ORDER — MIRTAZAPINE 15 MG PO TABS
30.0000 mg | ORAL_TABLET | Freq: Every evening | ORAL | Status: DC
Start: 2023-12-24 — End: 2024-01-10

## 2023-12-24 NOTE — Progress Notes (Signed)
 Date: 12/24/2023    Patient Name: [name],[name]    Patient was seen in their home (POS 12) in lieu of an office visit for the following reason:   Requires use of an assistive device in order to ambulate due to ambulatory dysfunction.      Code Status: DNAR    HPI:   Susan [name] is a 89 y.o. F that I am here to see for a primary care follow-up visit.  Daughter, [name], present during visit and supplemented her mother's information.    Susan [name] reports she has been feeling depressed since she returned from [place] .  She has lost some of her friends there and is sadden by their loss.  She is getting back into her painting and going to chair yoga but finds that she is sleeping more than she used to.  Appetite is okay and she does not feel she is losing weight.    She fell Wednesday outside when walking with her daughter and fell to the ground.  Hit her head and elbow but no loss of consciousness.  Denies pain, headache, visual changes.  Problem   Reactive Depression   Pedal Edema    Persists.  Lt > Rt.  Denied injury, discomfort, numbness and tingling.  Not eating a lot of salt.     Anxiety   Onychomycosis    Still not using Jublia  10% and not filing nail beds too short.       Seizures (Cms/Hcc)    No eye twitching.     Right Hand and Foot Pain (Resolved)    Stated the ball of her right foot feels like it is cramping at night.  Resolves when she gets up and walks.  Only on right.  Denied any injury or other known cause.     Syncope (Resolved)    Patient had f/u echocardiogram 11/21/21 s/p syncopal episode August 2022.  Echo unchanged from prior study. EF 55-60%. No changes in current treatment.     Traumatic Subdural Hemorrhage With Loss of Consciousness of Unspecified Duration, Initial Encounter (Cms/Hcc) (Resolved)     Review of Systems   Constitutional:  Positive for fatigue. Negative for activity change and appetite change.   Cardiovascular:  Negative for chest pain and palpitations.    Gastrointestinal:  Negative for constipation, diarrhea, nausea and vomiting.   Musculoskeletal:  Negative for arthralgias, back pain and myalgias.   Neurological:  Negative for dizziness, weakness and light-headedness.   Psychiatric/Behavioral:  Positive for dysphoric mood. Negative for sleep disturbance. The patient is not nervous/anxious.      Medical History[1]  Past Surgical History[2]  Family History[3]  Social History[4]  Allergies[5]    MEDICATIONS:   Current Medications[6]    PHYSICAL EXAM:   BP 124/68   Pulse 67   Temp (!) 96.5 F (35.8 C)   Resp 18   SpO2 98%    Wt Readings from Last 1 Encounters:   09/08/23 71.2 kg (157 lb)      Ht Readings from Last 1 Encounters:   09/08/23 1.575 m (5' 2)     Physical Exam  Constitutional:       General: She is not in acute distress.     Appearance: Normal appearance. She is not ill-appearing or toxic-appearing.   Cardiovascular:      Rate and Rhythm: Normal rate and regular rhythm.      Pulses: Normal pulses.      Heart sounds: Normal heart sounds.  Pulmonary:      Effort: Pulmonary effort is normal.      Breath sounds: Normal breath sounds.   Abdominal:      General: Bowel sounds are normal. There is no distension.      Palpations: Abdomen is soft.      Tenderness: There is no abdominal tenderness. There is no guarding.   Musculoskeletal:         General: Normal range of motion.      Right lower leg: Edema (1-2+ pedal) present.      Left lower leg: Edema (3+ pedal) present.   Skin:     General: Skin is dry.   Neurological:      Mental Status: She is alert and oriented to person, place, and time.   Psychiatric:         Mood and Affect: Mood is depressed.       DIAGNOSTICS:     Lab Results   Component Value Date    WBC 4.50 12/15/2023    HGB 13.0 12/15/2023    HCT 39.5 12/15/2023    PLT 207 12/15/2023    CHOL 154 12/15/2023    TRIG 125 12/15/2023    HDL 51 12/15/2023    LDL 78 12/15/2023    ALT 42 12/15/2023    AST 43 (H) 12/15/2023    NA 135 12/15/2023    K 4.5  12/15/2023    CL 102 12/15/2023    CREAT 0.9 12/15/2023    BUN 28 (H) 12/15/2023    CO2 23 12/15/2023    TSH 5.86 (H) 12/15/2023    INR 1.0 03/10/2021    GLU 89 12/15/2023    HGBA1C 5.6 03/10/2021    ALKPHOS 59 12/15/2023     No results found.    ASSESSMENT and PLAN:   Anxiety  A/P:  Has alprazolam  available as needed, rarely takes.    Reactive depression  A/P:  Discussed speaking with a therapist however declined.  Is on mirtazapine  15 mg and agreed to increase to 30 mg by mouth at bedtime.  Will call office if no improvement.    Seizures (CMS/HCC)  A/P:  Stable on Briviact  50 mg in AM and 100 mg in PM.  Briviact  50 mg refilled.    Onychomycosis  A/P:  Offered to provided contact info for in-home podiatry but she wishes to hold off for now.    Pedal edema  A/P:  Keep legs elevated.  To call office if worsens.    Medications Ordered This Encounter         Disp Refills Start End    mirtazapine  (REMERON ) 15 MG tablet -- -- 12/24/2023 03/23/2024    Take 2 tablets (30 mg) by mouth once at bedtime - Oral    Notes to Pharmacy: Depleting current supply first.    Brivaracetam  (BRIVIACT ) 50 MG Tablet tablet 90 tablet 2 12/24/2023 --    Take 1 tablet (50 mg) by mouth once every morning - Oral          No orders of the defined types were placed in this encounter.    Zelta was seen today for follow-up, depression, fall, leg swelling and nail problem.    Diagnoses and all orders for this visit:    Anxiety    Seizure (CMS/HCC)  -     Brivaracetam  (BRIVIACT ) 50 MG Tablet tablet; Take 1 tablet (50 mg) by mouth once every morning    Reactive depression    Seizures (CMS/HCC)  Onychomycosis    Pedal edema    Other orders  -     mirtazapine  (REMERON ) 15 MG tablet; Take 2 tablets (30 mg) by mouth once at bedtime        Electronically Signed by Slater KATHEE Sequin, NP         [1]   Past Medical History:  Diagnosis Date    Bleeding in head following injury with loss of consciousness (CMS/HCC)     a year ago    Convulsions (CMS/HCC)      Hyperlipidemia     Hypertension     Hypothyroidism     Irregular heartbeat    [2]   Past Surgical History:  Procedure Laterality Date    AORTIC VALVE REPLACEMENT      first AVR required sternotomy, >10 years ago per patient    BACK SURGERY      HYSTERECTOMY      REPLACEMENT TOTAL KNEE Bilateral     left total, right half knee replacement    TRANSCATHETER AORTIC VALVE REPLACEMENT  2016    5-6 years ago per patient    WRIST SURGERY Left 2017   [3]   Family History  Problem Relation Name Age of Onset    Stroke Mother      Anuerysm Mother      Cancer Father      Heart disease Sister      Heart disease Brother      Intracerebral hemorrhage Neg Hx     [4]   Social History  Tobacco Use    Smoking status: Never    Smokeless tobacco: Never   Vaping Use    Vaping status: Never Used   Substance Use Topics    Alcohol use: Not Currently     Alcohol/week: 3.0 standard drinks of alcohol     Types: 3 Glasses of wine per week     Comment: 1-2 a week    Drug use: Never   [5]   Allergies  Allergen Reactions    Codeine Nausea And Vomiting   [6]   Current Outpatient Medications:     acetaminophen  (TYLENOL ) 650 MG CR tablet, Take 1 tablet (650 mg) by mouth every 8 (eight) hours as needed for Pain, Disp: 30 tablet, Rfl: 0    ALPRAZolam  (Xanax ) 0.25 MG tablet, Take 1 tablet (0.25 mg) by mouth 2 (two) times daily as needed for Anxiety, Disp: 30 tablet, Rfl: 0    amLODIPine  (NORVASC ) 5 MG tablet, Take 1 tablet (5 mg) by mouth nightly Hold if SBP <100.  May take two 5 mg (10 mg) if SBP > 160., Disp: 90 tablet, Rfl: 3    brivaracetam  (Briviact ) 100 MG Tab tablet, Take 1 tablet (100 mg) by mouth every evening, Disp: 90 tablet, Rfl: 1    Brivaracetam  (BRIVIACT ) 50 MG Tablet tablet, Take 1 tablet (50 mg) by mouth once every morning, Disp: 90 tablet, Rfl: 2    levothyroxine  (SYNTHROID ) 50 MCG tablet, Take 1 tablet (50 mcg) by mouth daily, Disp: 90 tablet, Rfl: 3    lidocaine  (LMX) 4 % cream, Apply topically 4 (four) times daily as needed (pain),  Disp: 28 g, Rfl: 3    lisinopril  (ZESTRIL ) 10 MG tablet, TAKE 1 TABLET ORALLY ONCE A DAY EVERY MORNING FOR 30 DAY(S), Disp: 90 tablet, Rfl: 2    Magnesium  400 MG Tab, Take 1 tablet (400 mg) by mouth 2 (two) times daily, Disp: 180 tablet, Rfl: 3    mirtazapine  (REMERON )  15 MG tablet, Take 2 tablets (30 mg) by mouth once at bedtime, Disp: , Rfl:     Multiple Vitamins-Minerals (Centrum Silver  50+Women) Tab, Take 1 tablet by mouth daily, Disp: 100 tablet, Rfl: 3    rosuvastatin  (CRESTOR ) 20 MG tablet, Take 1 tablet (20 mg) by mouth nightly, Disp: 90 tablet, Rfl: 3    vitamin B-6 (PYRIDOXINE) 50 MG tablet, Take 1 tablet (50 mg) by mouth daily, Disp: 90 tablet, Rfl: 1

## 2023-12-25 ENCOUNTER — Encounter (INDEPENDENT_AMBULATORY_CARE_PROVIDER_SITE_OTHER): Payer: Self-pay | Admitting: Nurse Practitioner

## 2023-12-25 DIAGNOSIS — F329 Major depressive disorder, single episode, unspecified: Secondary | ICD-10-CM | POA: Insufficient documentation

## 2023-12-25 NOTE — Assessment & Plan Note (Signed)
 A/P:  Discussed speaking with a therapist however declined.  Is on mirtazapine  15 mg and agreed to increase to 30 mg by mouth at bedtime.  Will call office if no improvement.

## 2023-12-25 NOTE — Assessment & Plan Note (Signed)
 A/P:  Keep legs elevated.  To call office if worsens.

## 2023-12-25 NOTE — Assessment & Plan Note (Signed)
 A/P:  Has alprazolam  available as needed, rarely takes.

## 2023-12-25 NOTE — Assessment & Plan Note (Signed)
 A/P:  Offered to provided contact info for in-home podiatry but she wishes to hold off for now.

## 2023-12-25 NOTE — Assessment & Plan Note (Signed)
 A/P:  Stable on Briviact  50 mg in AM and 100 mg in PM.  Briviact  50 mg refilled.

## 2024-01-10 ENCOUNTER — Other Ambulatory Visit (INDEPENDENT_AMBULATORY_CARE_PROVIDER_SITE_OTHER): Payer: Self-pay | Admitting: Nurse Practitioner

## 2024-01-10 MED ORDER — MIRTAZAPINE 15 MG PO TABS
30.0000 mg | ORAL_TABLET | Freq: Every evening | ORAL | 2 refills | Status: DC
Start: 2024-01-10 — End: 2024-02-07

## 2024-01-10 MED ORDER — LEVOTHYROXINE SODIUM 50 MCG PO TABS
50.0000 ug | ORAL_TABLET | Freq: Every day | ORAL | 3 refills | Status: AC
Start: 2024-01-10 — End: 2025-01-04

## 2024-02-07 ENCOUNTER — Other Ambulatory Visit (INDEPENDENT_AMBULATORY_CARE_PROVIDER_SITE_OTHER): Payer: Self-pay | Admitting: Nurse Practitioner

## 2024-02-07 MED ORDER — MIRTAZAPINE 15 MG PO TABS
30.0000 mg | ORAL_TABLET | Freq: Every evening | ORAL | 2 refills | Status: DC
Start: 2024-02-07 — End: 2024-05-07

## 2024-02-08 ENCOUNTER — Encounter (INDEPENDENT_AMBULATORY_CARE_PROVIDER_SITE_OTHER): Payer: Self-pay

## 2024-02-08 NOTE — Progress Notes (Signed)
 THIS FORM IS FOR INTERNAL USE FOR CARDIOLOGY AND IS NOT A PROGRESS NOTE    Chart Prep Check List:  Completed Process Reviewed Notes   [x]  Review last office visit note. If "orders placed, ensure that the results are in the chart.    Refresh CareEverywhere- outside results may appear here.   If not in chart:         - Call the patient and clarify if they have done testing outside of [facility].             - If the patient has completed testing outside of [facility], request that the patient bring the results to their appointment or ask for facility information so you can call and request results to be faxed to clinic. Once faxed, scanned into chart.  - If unable to obtain, call patient and request for patient to bring physical copy to [facility] or provide in MyChart message.    If the patient has not completed testing, confirm with the provider if it is okay to proceed with the scheduled appointment or if the patient needs to schedule testing and follow up afterward. Contact designated PAA to reschedule appointment and diagnostic testing.    [x]  Check to make sure all recent labs are available/completed  If PCP is within [facility], will appear in Epic.   If outside of [facility], call to obtain records. Put in blue sticky note labs are scanned, specify lab, and date of labs.   If no PCP in chart, call patient to confirm no recent labs from outside facility.    [x]  Pend EKG, if needed  If EKG is completed, please provide date of last EKG in blue sticky note     []  If cardiac clearance:  Add physician/surgeon to care team  Document physician, name of procedure, and fax to the right and in  blue sticky note   Check media and concord for cardiac clearance request. If in concord, upload the request to chart.         Disclaimer: If calling patient, please ensure check list is completed prior to have all information/ questions readily available.

## 2024-02-08 NOTE — Progress Notes (Signed)
 THIS FORM IS FOR INTERNAL USE FOR CARDIOLOGY AND IS NOT A PROGRESS NOTE    Chart Prep Check List:  Completed Process Reviewed Notes   [x]  Review last office visit note. If "orders placed, ensure that the results are in the chart.    Refresh CareEverywhere- outside results may appear here.   If not in chart:         - Call the patient and clarify if they have done testing outside of [facility].             - If the patient has completed testing outside of [facility], request that the patient bring the results to their appointment or ask for facility information so you can call and request results to be faxed to clinic. Once faxed, scanned into chart.  - If unable to obtain, call patient and request for patient to bring physical copy to [facility] or provide in MyChart message.    If the patient has not completed testing, confirm with the provider if it is okay to proceed with the scheduled appointment or if the patient needs to schedule testing and follow up afterward. Contact designated PAA to reschedule appointment and diagnostic testing. - no orders placed LOV   [x]  Check to make sure all recent labs are available/completed  If PCP is within [facility], will appear in Epic.   If outside of [facility], call to obtain records. Put in blue sticky note labs are scanned, specify lab, and date of labs.   If no PCP in chart, call patient to confirm no recent labs from outside facility. - Recent labs in Epic: Lipid, CMP, CBC 12/15/23   [x]  Pend EKG, if needed  If EKG is completed, please provide date of last EKG in blue sticky note  - EKG pended for S/P TAVR   []  If cardiac clearance:  Add physician/surgeon to care team  Document physician, name of procedure, and fax to the right and in  blue sticky note   Check media and concord for cardiac clearance request. If in concord, upload the request to chart.         Disclaimer: If calling patient, please ensure check list is completed prior to have all information/ questions readily available.

## 2024-02-08 NOTE — Progress Notes (Addendum)
 THIS FORM IS FOR INTERNAL USE FOR CARDIOLOGY AND IS NOT A PROGRESS NOTE    Chart Prep Check List:  Completed Process Reviewed Notes   [x]  Review last office visit note. If "orders placed, ensure that the results are in the chart.    Refresh CareEverywhere- outside results may appear here.   If not in chart:         - Call the patient and clarify if they have done testing outside of [facility].             - If the patient has completed testing outside of [facility], request that the patient bring the results to their appointment or ask for facility information so you can call and request results to be faxed to clinic. Once faxed, scanned into chart.  - If unable to obtain, call patient and request for patient to bring physical copy to [facility] or provide in MyChart message.    If the patient has not completed testing, confirm with the provider if it is okay to proceed with the scheduled appointment or if the patient needs to schedule testing and follow up afterward. Contact designated PAA to reschedule appointment and diagnostic testing.    [x]  Check to make sure all recent labs are available/completed  If PCP is within [facility], will appear in Epic.   If outside of [facility], call to obtain records. Put in blue sticky note labs are scanned, specify lab, and date of labs.   If no PCP in chart, call patient to confirm no recent labs from outside facility.    [x]  Pend EKG, if needed  If EKG is completed, please provide date of last EKG in blue sticky note     []  If cardiac clearance:  Add physician/surgeon to care team  Document physician, name of procedure, and fax to the right and in  blue sticky note   Check media and concord for cardiac clearance request. If in concord, upload the request to chart.         Disclaimer: If calling patient, please ensure check list is completed prior to have all information/ questions readily available.

## 2024-02-09 ENCOUNTER — Other Ambulatory Visit (INDEPENDENT_AMBULATORY_CARE_PROVIDER_SITE_OTHER): Payer: Self-pay | Admitting: Nurse Practitioner

## 2024-02-09 MED ORDER — MIRTAZAPINE 15 MG PO TABS
30.0000 mg | ORAL_TABLET | Freq: Every evening | ORAL | 2 refills | Status: DC
Start: 2024-02-09 — End: 2024-04-17

## 2024-02-11 ENCOUNTER — Ambulatory Visit (INDEPENDENT_AMBULATORY_CARE_PROVIDER_SITE_OTHER): Admitting: Internal Medicine

## 2024-02-11 ENCOUNTER — Encounter (INDEPENDENT_AMBULATORY_CARE_PROVIDER_SITE_OTHER): Payer: Self-pay | Admitting: Internal Medicine

## 2024-02-11 VITALS — BP 112/68 | HR 76 | Ht 62.0 in | Wt 156.0 lb

## 2024-02-11 DIAGNOSIS — E785 Hyperlipidemia, unspecified: Secondary | ICD-10-CM

## 2024-02-11 DIAGNOSIS — Z953 Presence of xenogenic heart valve: Secondary | ICD-10-CM

## 2024-02-11 DIAGNOSIS — I10 Essential (primary) hypertension: Secondary | ICD-10-CM

## 2024-02-11 DIAGNOSIS — R002 Palpitations: Secondary | ICD-10-CM

## 2024-02-11 LAB — ECG 12-LEAD
Atrial Rate: 73 {beats}/min
IHS MUSE NARRATIVE AND IMPRESSION: NORMAL
P Axis: 31 degrees
P-R Interval: 144 ms
Q-T Interval: 422 ms
QRS Duration: 100 ms
QTC Calculation (Bezet): 464 ms
R Axis: 22 degrees
T Axis: 23 degrees
Ventricular Rate: 73 {beats}/min

## 2024-02-11 NOTE — Progress Notes (Signed)
 [facility] OFFICE CONSULTATION    I had the pleasure of seeing Ms. Moree today for cardiovascular evaluation. She is a pleasant 89 y.o. 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  She is accompanied by her daughter.  She has no complaints today and feels well.  She is relatively active.  She walks around the house.  She also does yoga and low impact exercises.  She has no chest pain, shortness of breath, palpitations, dizziness, syncope.  Rare episodes of ankle swelling.          PAST MEDICAL HISTORY: She has a past medical history of Balance problem, Bladder problem, Bleeding in head following injury with loss of consciousness (CMS/HCC), Complication of anesthesia, Convulsions (CMS/HCC), Headache, Hearing loss, Hypercholesterolemia, Hyperlipidemia, Hypertension, Hypothyroidism, Irregular heartbeat, Occupational therapy and vocational rehabilitation, Other physical therapy, Post-operative nausea and vomiting, and Syncope and collapse. She has a past surgical history that includes Hysterectomy; Replacement total knee (Bilateral); Back surgery; TRANSCATHETER AORTIC VALVE REPLACEMENT (2016); Aortic valve replacement; Wrist surgery (Left, 2017); Cardiac valve replacement; and Joint replacement.        MEDICATIONS: Current Medications[1]        ALLERGIES: Allergies[2]      FAMILY HISTORY: Her family history includes Anuerysm in her mother; Arthritis in her sister; Cancer in her father; Diabetes in her mother; Heart disease in her brother, brother, and sister; Stroke in her mother.      SOCIAL HISTORY: She reports that she has never smoked. She has never used smokeless tobacco. She reports that she does not currently use alcohol after a past usage of about 3.0 standard drinks of alcohol per week. She reports that she does not use drugs.      REVIEW OF SYSTEMS: All other systems reviewed and negative  except as above.         PHYSICAL EXAMINATION  General Appearance:  A well-appearing female in no acute distress.    Vital Signs: BP 112/68 (BP Site: Left arm, Patient Position: Sitting, Cuff Size: Medium)   Pulse 76   Ht 1.575 m (5' 2)   Wt 70.8 kg (156 lb)   SpO2 97%   BMI 28.53 kg/m    HEENT: Sclera anicteric, conjunctiva without pallor, moist mucous membranes, normal dentition. No arcus.   Neck:  Supple without jugular venous distention. Thyroid  nonpalpable. Normal carotid upstrokes without bruits.   Chest: Clear to auscultation bilaterally with good air movement and respiratory effort and no wheezes, rales, or rhonchi   Cardiovascular: Normal S1 and physiologically split S2 with short systolic murmur aortic area. No gallops or rub. PMI of normal size and nondisplaced.   Extremities: Warm without edema  Skin: No rash, xanthoma or xanthelasma.   Neuro: Alert and oriented x3. Grossly intact. Strength is symmetrical. Normal mood and affect.           ECG: Today, I have independently reviewed the tracing, sinus rhythm, incomplete right bundle branch block.    Echocardiogram 11/21/2021    Summary    * The left ventricle is normal in size.    * Left ventricular ejection fraction is visually normal with an estimated  ejection fraction of 55-60%.    * There is mild concentric left ventricular hypertrophy.    * Left ventricular diastolic filling parameters are consistent with Grade I  diastolic dysfunction (impaired relaxation pattern).    * The right ventricular cavity size is  normal in size.    * Normal right ventricular systolic function.    * RVSP 34 mmHg.    * There is a bioprosthetic valve in aortic position with normal gradients.  AV vmax 2.07 m/s with mean gradient 9 mmHg.    * Mild mitral valve stenosis: MV Vmax 1.89 m/s with peak (14 mmHg) and mean  ( 5 mmHg) gradient.    * There is mild tricuspid regurgitation.    * Compared to prior echo report 03/10/21 there is no significant change.        LABS:   Lab  Results   Component Value Date    WBC 4.50 12/15/2023    HGB 13.0 12/15/2023    HCT 39.5 12/15/2023    PLT 207 12/15/2023    NA 135 12/15/2023    K 4.5 12/15/2023    MG 2.4 12/15/2023    BUN 28 (H) 12/15/2023    CREAT 0.9 12/15/2023    GLU 89 12/15/2023    AST 43 (H) 12/15/2023    ALT 42 12/15/2023    HGBA1C 5.6 03/10/2021    TSH 5.86 (H) 12/15/2023    TROPI 0.02 03/09/2021     Recent Labs     12/15/23  1403 02/01/23  1210 03/10/21  0353   Cholesterol 154 137 121   Triglycerides 125 92 96   HDL 51 44 35*   LDL Calculated 78 75 67                      IMPRESSION/RECOMMENDATIONS:     Surgical bioprosthetic AVR 2009 status post TAVR and SAVR 2017 -stable on echocardiogram 11/2021 peak velocity 2.1 m/s, mean gradient 9 mmHg.  Preserved EF 55 to 60%.  Stable, asymptomatic.  Not on aspirin due to history of subdural hematoma and subarachnoid hemorrhage.  Compliant with antibiotics prior to dental procedures.  Continue to monitor      Hypertension -well-controlled on amlodipine  5 mg p.o. daily and lisinopril  10 mg p.o. daily.  Stable renal function, potassium 4.5 creatinine 0.9.  Continue with low-sodium diet.  Continue to monitor.    Hyperlipidemia -she has been maintained on rosuvastatin  20 mg p.o. daily.  Lipids at goal, LDL 78 HDL 51 triglycerides 125.  Mildly elevated AST 43, continue to monitor.  Normal ALT 42.  Continue to follow with PCP.    History of PSVT -she is asymptomatic.  Continue to monitor.    Traumatic subdural hematoma subarachnoid hemorrhage 2022 - not on ASA.  Patient follows with neurology.  History of seizure disorder.      Follow-up in 1 year, prior if any issues.      Jodee LOISE Peyer, MD  02/11/2024, 11:51 AM  [facility]  [phone]         [1]   Current Outpatient Medications   Medication Sig Dispense Refill    acetaminophen  (TYLENOL ) 650 MG CR tablet Take 1 tablet (650 mg) by mouth every 8 (eight) hours as needed for Pain 30 tablet 0    ALPRAZolam  (Xanax ) 0.25 MG tablet  Take 1 tablet (0.25 mg) by mouth 2 (two) times daily as needed for Anxiety 30 tablet 0    amLODIPine  (NORVASC ) 5 MG tablet Take 1 tablet (5 mg) by mouth nightly Hold if SBP <100.  May take two 5 mg (10 mg) if SBP > 160. 90 tablet 3    brivaracetam  (Briviact ) 100 MG Tab tablet Take 1 tablet (100 mg)  by mouth every evening 90 tablet 1    Brivaracetam  (BRIVIACT ) 50 MG Tablet tablet Take 1 tablet (50 mg) by mouth once every morning 90 tablet 2    levothyroxine  (SYNTHROID ) 50 MCG tablet Take 1 tablet (50 mcg) by mouth once daily 90 tablet 3    lidocaine  (LMX) 4 % cream Apply topically 4 (four) times daily as needed (pain) 28 g 3    lisinopril  (ZESTRIL ) 10 MG tablet TAKE 1 TABLET ORALLY ONCE A DAY EVERY MORNING FOR 30 DAY(S) 90 tablet 2    Magnesium  400 MG Tab Take 1 tablet (400 mg) by mouth 2 (two) times daily 180 tablet 3    mirtazapine  (REMERON ) 15 MG tablet Take 2 tablets (30 mg) by mouth once at bedtime 60 tablet 2    Multiple Vitamins-Minerals (Centrum Silver  50+Women) Tab Take 1 tablet by mouth daily 100 tablet 3    rosuvastatin  (CRESTOR ) 20 MG tablet Take 1 tablet (20 mg) by mouth nightly 90 tablet 3    vitamin B-6 (PYRIDOXINE) 50 MG tablet Take 1 tablet (50 mg) by mouth daily 90 tablet 1     No current facility-administered medications for this visit.   [2]   Allergies  Allergen Reactions    Codeine Nausea And Vomiting

## 2024-02-28 ENCOUNTER — Encounter (INDEPENDENT_AMBULATORY_CARE_PROVIDER_SITE_OTHER): Payer: Self-pay | Admitting: Nurse Practitioner

## 2024-03-24 ENCOUNTER — Encounter (INDEPENDENT_AMBULATORY_CARE_PROVIDER_SITE_OTHER): Admitting: Nurse Practitioner

## 2024-03-24 ENCOUNTER — Ambulatory Visit (INDEPENDENT_AMBULATORY_CARE_PROVIDER_SITE_OTHER): Admitting: Nurse Practitioner

## 2024-03-26 ENCOUNTER — Encounter: Payer: Self-pay | Admitting: Neurology

## 2024-03-29 ENCOUNTER — Encounter: Payer: Self-pay | Admitting: Neurology

## 2024-03-29 ENCOUNTER — Ambulatory Visit: Payer: Medicare Other | Attending: Neurology | Admitting: Neurology

## 2024-03-29 DIAGNOSIS — R569 Unspecified convulsions: Secondary | ICD-10-CM | POA: Insufficient documentation

## 2024-03-29 MED ORDER — MAGNESIUM 400 MG PO TABS: 1.0000 | ORAL_TABLET | Freq: Two times a day (BID) | ORAL | 1 refills | Status: DC

## 2024-03-29 MED ORDER — BRIVARACETAM 50 MG PO TABS
50.0000 mg | ORAL_TABLET | Freq: Every morning | ORAL | 1 refills | Status: AC
Start: 2024-03-29 — End: ?

## 2024-03-29 MED ORDER — BRIVIACT 100 MG PO TABS
1.0000 | ORAL_TABLET | Freq: Every evening | ORAL | 1 refills | Status: DC
Start: 2024-03-29 — End: 2024-03-31

## 2024-03-29 NOTE — Progress Notes (Signed)
 Subjective:       Please see detailed assessment and plan    Patient ID: [name] is a 89 y.o. female visiting from [place]  hx HTN, HLD, hypothyroid, irregular heartbeat, sternotomy for aortic valve replacement >10 years ago then repeat aortic valve replacement 5-6 years ago (procedure sounds like TAVR), intracerebral hemorrhage after fall 2021 [facility] [place] [facility] [place], [place] - no residual deficit per daughter), trauma admit 8/15-8/18 for left temporal and smaller rigth temporal hemorrhagic contusion + left greater than right SDH (only right SDH is along tentorial leaflet) + IVH  here for Seizures (No sz or auras since last visit.)  .    Current visit:     See detailed assessment / plan for the new developments since last visit and the respective new recommendations.     HPI  Background from Dr. Odis discharge note 02/2021 89 y.o. female visiting from [place]  hx HTN, HLD, hypothyroid, irregular heartbeat, sternotomy for aortic valve replacement >10 years ago then repeat aortic valve replacement 5-6 years ago (procedure sounds like TAVR), intracerebral hemorrhage after fall 2021 [facility] [place], [place] - no residual deficit per daughter), trauma admit 8/15-8/18 for left temporal and smaller rigth temporal hemorrhagic contusion + left greater than right SDH (only right SDH is along tentorial leaflet) + IVH who presents to the hospital with expressive and receptive aphasia. She was doing well after the trauma admit until ~10:30 today when at brunch it was difficult to get her attention even when calling out her name. Later she was found sitting with her head down in her hands and again it was difficult to get her attention. When she spoke there was word finding difficulty and some word salad. She appeared to have expressive and receptive aphasia for me. She denies headache. These symptoms are sudden onset, moderate intensity, without alleviating factors.    Her daughter also noted one  episode of right hand shaking last admission making it difficult for her to use a spoon. Her daughter has noted infrequent right facial twitching which started prior to the trauma admission.    Of note: Once patient was placed on briviact , her coomunication issues were gone.  For about 5 years before she was having right sided facial twitching,  which also stopped after starting Briviact .  Next CT scheduled 10/18/ 2022 with followup with neurosurgery..  Mother usually lives in [place]  by herself and is here now to stay.  Emphasized no driving for at least 6 months per state law.    cvEEG 03/12/2021  IMPRESSION: Abnormal EEG due to:   1. Occasional left temporal intermittent rhythmic delta activity (TIRDA)   2. Left anterior to midtemporal focal slowing    MRI Brain 03/10/2021  IMPRESSION:   1. No acute infarct.   2. Evolution and decreased size of hematoma in the lateral left temporal lobe and smaller hemorrhagic contusion in the right parietal lobe with surrounding edema as detailed. No interval hemorrhage compared to the prior 03/04/2021 MRI  3. Stable left subdural hematoma and sequela of prior subarachnoid hemorrhage as detailed above.  4. Mild global volume loss, chronic small vessel ischemic changes and multiple chronic cerebellar infarcts.     CT head 03/21/2021  IMPRESSION:    1.  Interval resolution of parenchymal hemorrhage associated with a prior left MCA/temporal lobe infarct since prior noncontrast head CT.   2.  Improved subdural hematoma along the left tentorial leaflet and posterior cerebral falx with only trace residual.  3.  No new hemorrhage or evidence of a new large vascular territory infarct.  4.  Otherwise similar chronic findings, as above, as on prior imaging.    Review of Systems  No other motor, sensory, vision, cognitive, speech, swallow, bowel, or bladder changes; no tremors, convulsions, seizure activity, or loss of consciousness episodes.  All other systems were reviewed and were negative.      Current Outpatient Medications on File Prior to Visit   Medication Sig Dispense Refill    acetaminophen  (TYLENOL ) 650 MG CR tablet Take 1 tablet (650 mg) by mouth every 8 (eight) hours as needed for Pain 30 tablet 0    ALPRAZolam  (Xanax ) 0.25 MG tablet Take 1 tablet (0.25 mg) by mouth 2 (two) times daily as needed for Anxiety 30 tablet 0    amLODIPine  (NORVASC ) 5 MG tablet Take 1 tablet (5 mg) by mouth nightly Hold if SBP <100.  May take two 5 mg (10 mg) if SBP > 160. 90 tablet 3    levothyroxine  (SYNTHROID ) 50 MCG tablet Take 1 tablet (50 mcg) by mouth once daily 90 tablet 3    lisinopril  (ZESTRIL ) 10 MG tablet TAKE 1 TABLET ORALLY ONCE A DAY EVERY MORNING FOR 30 DAY(S) 90 tablet 2    mirtazapine  (REMERON ) 15 MG tablet Take 2 tablets (30 mg) by mouth once at bedtime 60 tablet 2    Multiple Vitamins-Minerals (Centrum Silver  50+Women) Tab Take 1 tablet by mouth daily 100 tablet 3    rosuvastatin  (CRESTOR ) 20 MG tablet Take 1 tablet (20 mg) by mouth nightly 90 tablet 3    vitamin B-6 (PYRIDOXINE) 50 MG tablet Take 1 tablet (50 mg) by mouth daily 90 tablet 1    [DISCONTINUED] brivaracetam  (Briviact ) 100 MG Tab tablet Take 1 tablet (100 mg) by mouth every evening 90 tablet 1    [DISCONTINUED] Brivaracetam  (BRIVIACT ) 50 MG Tablet tablet Take 1 tablet (50 mg) by mouth once every morning 90 tablet 2    [DISCONTINUED] Magnesium  400 MG Tab Take 1 tablet (400 mg) by mouth 2 (two) times daily 180 tablet 3    lidocaine  (LMX) 4 % cream Apply topically 4 (four) times daily as needed (pain) (Patient not taking: Reported on 03/29/2024) 28 g 3     No current facility-administered medications on file prior to visit.     Current/Home Medications    ACETAMINOPHEN  (TYLENOL ) 650 MG CR TABLET    Take 1 tablet (650 mg) by mouth every 8 (eight) hours as needed for Pain    ALPRAZOLAM  (XANAX ) 0.25 MG TABLET    Take 1 tablet (0.25 mg) by mouth 2 (two) times daily as needed for Anxiety    AMLODIPINE  (NORVASC ) 5 MG TABLET    Take 1 tablet (5  mg) by mouth nightly Hold if SBP <100.  May take two 5 mg (10 mg) if SBP > 160.    LEVOTHYROXINE  (SYNTHROID ) 50 MCG TABLET    Take 1 tablet (50 mcg) by mouth once daily    LIDOCAINE  (LMX) 4 % CREAM    Apply topically 4 (four) times daily as needed (pain)    LISINOPRIL  (ZESTRIL ) 10 MG TABLET    TAKE 1 TABLET ORALLY ONCE A DAY EVERY MORNING FOR 30 DAY(S)    MIRTAZAPINE  (REMERON ) 15 MG TABLET    Take 2 tablets (30 mg) by mouth once at bedtime    MULTIPLE VITAMINS-MINERALS (CENTRUM SILVER  50+WOMEN) TAB    Take 1 tablet by mouth daily    ROSUVASTATIN  (  CRESTOR ) 20 MG TABLET    Take 1 tablet (20 mg) by mouth nightly    VITAMIN B-6 (PYRIDOXINE) 50 MG TABLET    Take 1 tablet (50 mg) by mouth daily     Allergies   Allergen Reactions    Codeine Nausea And Vomiting      Patient Active Problem List    Diagnosis Date Noted    Reactive depression [F32.9] 12/25/2023    Pedal edema [R60.0] 01/29/2023    Anxiety [F41.9] 10/03/2022    Chronic low back pain [M54.50, G89.29] 06/27/2022    Fall [W19.XXXA] 02/01/2022    Diarrhea [R19.7] 02/01/2022    Onychomycosis [B35.1] 10/29/2021    Neck pain, bilateral posterior [M54.2] 09/11/2021    Loss of appetite [R63.0] 08/14/2021    Fatigue [R53.83] 08/14/2021    Seizures (CMS/HCC) [R56.9] 08/14/2021    Situational depression [F43.21] 07/13/2021    Constipation [K59.00] 05/07/2021    Hypertension [I10] 05/07/2021    Hyperlipidemia [E78.5] 05/07/2021    Palpitations [R00.2] 04/21/2021    S/p TAVR (transcatheter aortic valve replacement), 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] 04/18/2021    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.109] 04/18/2021    Hypothyroidism [E03.9] 04/18/2021    Osteoporosis [M81.0] 04/18/2021    Gait abnormality [R26.9] 04/18/2021    Cerebral edema (CMS/HCC) [G93.6] 03/10/2021    Left temporal and right parietal hemorrhagic contusions on MRI 03/04/21 [I61.9] 03/10/2021    Subdural hematoma (CMS/HCC) [S06.5XAA] 03/10/2021    Hyponatremia [E87.1] 03/10/2021    Nontraumatic acute subdural  hemorrhage (CMS/HCC) [I62.01] 03/03/2021     Past Surgical History:   Procedure Laterality Date    AORTIC VALVE REPLACEMENT      first AVR required sternotomy, >10 years ago per patient    BACK SURGERY      CARDIAC VALVE REPLACEMENT      HYSTERECTOMY      JOINT REPLACEMENT      REPLACEMENT TOTAL KNEE Bilateral     left total, right half knee replacement    TRANSCATHETER AORTIC VALVE REPLACEMENT  2016    5-6 years ago per patient    WRIST SURGERY Left 2017     Social History     Socioeconomic History    Marital status: Widowed   Tobacco Use    Smoking status: Never    Smokeless tobacco: Never   Vaping Use    Vaping status: Never Used   Substance and Sexual Activity    Alcohol use: Not Currently     Comment: 1-2 a week    Drug use: Never    Sexual activity: Not Currently     Social Drivers of Health     Financial Resource Strain: Low Risk  (03/26/2024)    Overall Financial Resource Strain (CARDIA)     Difficulty of Paying Living Expenses: Not hard at all   Food Insecurity: No Food Insecurity (03/26/2024)    Hunger Vital Sign     Worried About Running Out of Food in the Last Year: Never true     Ran Out of Food in the Last Year: Never true   Transportation Needs: No Transportation Needs (03/26/2024)    PRAPARE - Therapist, art (Medical): No     Lack of Transportation (Non-Medical): No   Physical Activity: Sufficiently Active (03/26/2024)    Exercise Vital Sign     Days of Exercise per Week: 5 days     Minutes of Exercise per Session: 60 min   Stress: No Stress Concern  Present (03/26/2024)    [employer] of Occupational Health - Occupational Stress Questionnaire     Feeling of Stress : Not at all   Social Connections: Moderately Integrated (01/28/2024)    Social Connection and Isolation Panel     Frequency of Communication with Friends and Family: More than three times a week     Frequency of Social Gatherings with Friends and Family: More than three times a week     Attends Religious Services:  More than 4 times per year     Active Member of [employer] or Organizations: Yes     Attends Banker Meetings: More than 4 times per year     Marital Status: Widowed   Intimate Partner Violence: Not At Risk (03/26/2024)    Humiliation, Afraid, Rape, and Kick questionnaire     Fear of Current or Ex-Partner: No     Emotionally Abused: No     Physically Abused: No     Sexually Abused: No   Housing Stability: Not At Risk (03/26/2024)    Housing Stability NCSS     Do you have housing?: Yes     Are you worried about losing your housing?: No     Family History   Problem Relation Name Age of Onset    Stroke Mother Claude Lan     Anuerysm Mother Claude Lan     Diabetes Mother Claude Lan     Cancer Father Madonna Lan     Heart disease Sister      Heart disease Brother      Arthritis Sister all my sisters (3)     Heart disease Brother Lamar Lan     Intracerebral hemorrhage Neg Hx       She  has a past medical history of Balance problem, Bladder problem, Bleeding in head following injury with loss of consciousness (CMS/HCC), Complication of anesthesia, Convulsions (CMS/HCC), Headache, Hearing loss, Hypercholesterolemia, Hyperlipidemia, Hypertension, Hypothyroidism, Irregular heartbeat, Occupational therapy and vocational rehabilitation, Other physical therapy, Post-operative nausea and vomiting, and Syncope and collapse.        Objective:      Physical Exam Neurologic Exam    Vital Signs:  Reviewed    General: Well developed and well nourished. No acute distress. Cooperative with the exam  ENT: Normal oral mucosa, no ear or nose discharge  Neck: Symmetric, no deformities  CV: RRR  Resp: No audible wheezing, normal work of breathing  Abd: Soft, nondistended  Skin: Intact, extremities normal in color  Psych: Affect is normal, good insight    Mental Status: The patient is awake, alert and oriented to person, place, and time.  Affect is normal  Fund of knowledge appropriate  Recent and remote  memory are intact   Attention span and concentration appear normal.  Language function is normal. There is no evidence of aphasia in conversational speech.    Cranial nerves:   -CN II: Visual fields full to bedside confrontation   -CN III, IV, VI: Pupils equal, round, and reactive to light; extraocular movements intact; no ptosis              -CN V: Facial sensation intact in V1 through V3 distributions   -CN VII: Face symmetric   -CN VIII: Hearing intact to conversational speech   -CN IX, X: Palate elevates symmetrically; normal phonation   -CN XI: Symmetric full strength of sternocleidomastoid and trapezius muscles   -CN XII: Tongue protrudes midline    Motor: Muscle tone normal  without spasticity or flaccidity. No atrophy.  No pronator drift.     UEs:   Deltoid Bicep Tricep WE WF Grip IO   Right 5 5 5 5 5 5 5    Left 5 5 5 5 5 5 5      LEs   HF HE KF KE PF DF   Right 5 5 5 5 5 5    Left 5 5 5 5 5 5      Sensory:   Light touch intact.  Temperature intact.  Vibration intact.    Reflexes:      B T BR P A   Right 2 2 2 2 2    Left 2 2 2 2 2      Plantars:    Coordination: FTN intact, no truncal ataxia. RAMs intact. 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 female visiting from [place]  hx HTN, HLD, hypothyroid, irregular heartbeat, sternotomy for aortic valve replacement >10 years ago then repeat aortic valve replacement 5-6 years ago (procedure sounds like TAVR), intracerebral hemorrhage after fall 2021 [facility] [place], [place] - no residual deficit per daughter), trauma  admit 8/15-8/18 for left temporal and smaller rigth temporal hemorrhagic contusion + left greater than right SDH (only right SDH is along tentorial leaflet) + IVH  here for Seizures    08/06/2021 visit:  Patient and family deny seizures since last visit.  Patient has been having eye twitching that has been coming and going.  Initially the Briviact  helped with the eye twitching then eye twitching returned then went away again.  When asked about PT for gait disturbance, it was noted that patient did not start PT.  Patient went to [place]  for a lengthy visit and while her daughter noted that the patient's balance continues to be poor, the daughter noted that patient's shoes are very large and slide around on her.  Daughter reported that she will get the patient thicker socks.  Daughter reports that the patient is pretty steady using her walker, however, does not use her walker at home and uses a wheelchair when out for longer trips.    10/10/21 visit:  reduced briviact  helped a lot. Will add magnesium  400. No seizures or auras since last visit. No ER visits. Compliant with medications. Tolerating well.     04/10/22 visit: No seizures or auras since last visit. No ER visits. Compliant with medications. Tolerating well. Compliance is good.     10/07/22 visit: No seizures or auras since last visit. No ER visits. Compliant with medications. Tolerating well. Compliance is good.     03/10/2023 visit: Had episode of eye twitching but adding magnesium  to help her.  Otherwise,     09/09/23 visit: No seizures or auras since last visit. No ER visits. Compliant with medications. Tolerating well.     03/29/24 visit: No seizures or auras since last visit. No ER visits. Compliant with medications. Tolerating well. Compliance is good. Again readdressed/ discussed the need  for AEDs continuation/ duration of medication. Refills sent to the requested pharmacy. Will follow up 6 monthly intervals. She will approach me through my chart if any  concerns or questions in the mean time.    Time spent today (including but not limited to EEG review, neuroimaging review, previous clinical notes reviewed, discussion with the patient regarding the plan and documentation)  = 40 minutes    Currently Pertinent h/o negative for diplopia, dysphagia, dysarthria, facial drooping. Facial numbness. Focal numbness, focal weakness. Neck trauma, back trauma,radicular pain, gait disturbances, LOC episodes, LOA episodes, falls, bowel bladder disturbances.    More than 50% of this office visit was spent counseling the patient on one or more of the following:   Disease state - discussion about the medical condition, diagnostic and treatment options   Medications - indications and side effects   Lifestyle issues as appropriate.       Recommend:  Continue magnesium  400.   Continue  Briviact  to 50 mg in Am and 100 mg in PM refills sent to pharmacy: refill for 1 year ok    Continue to participate socially, physically and cognitively as much he can to improve brain health  Refills sent the pharmacy as requested:   Continue Seizure precautions, avoid seizure triggers like missing seizure meds, lack of sleep, too much physical/cognitive stress, driving restrictions per state law ( in [place], be aware not to drive for six months from last seizure with LOC/LOA).   Advised patient to use Mychart for any questions in the interm    Return to clinic in 12 months : you can schedule the appt by calling [phone]               Plan:      No orders of the defined types were placed in this encounter.    Medications Ordered This Encounter         Disp Refills Start End    brivaracetam  (Briviact ) 100 MG Tablet tablet 90 tablet 1 03/29/2024 --    Take 1 tablet (100 mg) by mouth once every evening - Oral    Brivaracetam  (BRIVIACT ) 50 MG Tablet tablet 90 tablet 1 03/29/2024 --    Take 1 tablet (50 mg) by mouth once every morning - Oral    Magnesium  400 MG Tablet 180 tablet 1 03/29/2024 --    Take 1 tablet  (400 mg) by mouth 2 (two) times daily - Oral        All relevant and clinical information was transcribed by me, Arlean HERO. Renella, NP, acting as a scribe for Dr. Deylan Canterbury.    Agree     Additional notes and data scanned including patient questionnaire which may contain pertinent information to visit. Patient can follow up sooner if needed. In the meantime, patient will contact the office with any questions or concerns.     Total of 40 minutes were spent on the day of service including face-to-face time with patient, coordinating care, record review and documentation, explaining the natural history of seizures, types of seizures, triggers for seizures.  Need for the necessary testing.  EEG and MRI.  Need for a seizure medication compliance for medications.  Seizure precautions including avoid driving per [place]  state law.  6 months from the last seizure.  Side effects of seizure medications.  Avoiding triggers for seizures.  Backup plan, and the time of partial seizure and convulsive seizures.  Including going to the emergency room.  Taking emergency rescue medications.  Rober Cinnamon, MD. FAES    Director, [facility]  Assistant Professor, [facility]  Board Certified,Neurology  Board Certified, Clinical Neurophysiology    [email or web address]      This note was generated by the Epic EMR system/ Dragon speech recognition and may contain inherent errors or omissions not intended by the user. Grammatical errors, random word insertions, deletions, pronoun errors and incomplete sentences are occasional consequences of this technology due to software limitations. Not all errors are caught or corrected.Although every attempt is made to root out erroneus and incomplete transcription, the note may still not fully represent the intent or opinion of the author. If there are questions or concerns about the content of this note or information  contained within the body of this dictation they should be addressed directly with the author for clarification.*

## 2024-03-31 ENCOUNTER — Other Ambulatory Visit: Payer: Self-pay

## 2024-03-31 DIAGNOSIS — R569 Unspecified convulsions: Secondary | ICD-10-CM

## 2024-03-31 MED ORDER — BRIVIACT 100 MG PO TABS
1.0000 | ORAL_TABLET | Freq: Every evening | ORAL | 1 refills | Status: AC
Start: 2024-03-31 — End: ?

## 2024-04-10 ENCOUNTER — Other Ambulatory Visit (INDEPENDENT_AMBULATORY_CARE_PROVIDER_SITE_OTHER): Payer: Self-pay | Admitting: Nurse Practitioner

## 2024-04-10 DIAGNOSIS — E785 Hyperlipidemia, unspecified: Secondary | ICD-10-CM

## 2024-04-11 MED ORDER — AMLODIPINE BESYLATE 5 MG PO TABS
5.0000 mg | ORAL_TABLET | Freq: Every evening | ORAL | 3 refills | Status: AC
Start: 2024-04-11 — End: 2025-04-06

## 2024-04-11 MED ORDER — ROSUVASTATIN CALCIUM 20 MG PO TABS
20.0000 mg | ORAL_TABLET | Freq: Every evening | ORAL | 3 refills | Status: AC
Start: 2024-04-11 — End: ?

## 2024-04-17 ENCOUNTER — Other Ambulatory Visit (INDEPENDENT_AMBULATORY_CARE_PROVIDER_SITE_OTHER): Payer: Self-pay | Admitting: Nurse Practitioner

## 2024-04-17 MED ORDER — MIRTAZAPINE 15 MG PO TABS
30.0000 mg | ORAL_TABLET | Freq: Every evening | ORAL | 2 refills | Status: DC
Start: 2024-04-17 — End: 2024-05-24

## 2024-05-02 ENCOUNTER — Encounter (INDEPENDENT_AMBULATORY_CARE_PROVIDER_SITE_OTHER): Payer: Self-pay | Admitting: Nurse Practitioner

## 2024-05-18 ENCOUNTER — Other Ambulatory Visit (INDEPENDENT_AMBULATORY_CARE_PROVIDER_SITE_OTHER): Payer: Self-pay | Admitting: Nurse Practitioner

## 2024-05-23 ENCOUNTER — Telehealth (INDEPENDENT_AMBULATORY_CARE_PROVIDER_SITE_OTHER): Payer: Self-pay | Admitting: Nurse Practitioner

## 2024-05-23 NOTE — Telephone Encounter (Signed)
 [facility] LPN received fax from [facility]:    90 day prescription request    MIRTAZAPINE  15 MG TAB    TAKE 2 TABLETS (30 MG) BY MOUTH ONCE AT BEDTIME

## 2024-05-24 ENCOUNTER — Encounter (INDEPENDENT_AMBULATORY_CARE_PROVIDER_SITE_OTHER): Payer: Self-pay | Admitting: Nurse Practitioner

## 2024-05-24 ENCOUNTER — Other Ambulatory Visit (INDEPENDENT_AMBULATORY_CARE_PROVIDER_SITE_OTHER): Payer: Self-pay | Admitting: Nurse Practitioner

## 2024-05-24 MED ORDER — MIRTAZAPINE 30 MG PO TABS
30.0000 mg | ORAL_TABLET | Freq: Every evening | ORAL | 3 refills | Status: AC
Start: 2024-05-24 — End: 2024-08-22

## 2024-05-24 NOTE — Progress Notes (Signed)
 Order sent for mirtazapine  30 mg one tab by mouth nightly.  D/C'd mirtazapine  15 mg 2 tabs.

## 2024-05-26 ENCOUNTER — Ambulatory Visit: Admitting: Nurse Practitioner

## 2024-05-26 ENCOUNTER — Encounter (INDEPENDENT_AMBULATORY_CARE_PROVIDER_SITE_OTHER): Payer: Self-pay | Admitting: Nurse Practitioner

## 2024-05-26 VITALS — BP 138/78 | HR 71 | Temp 98.2°F | Ht 62.0 in | Wt 156.0 lb

## 2024-05-26 DIAGNOSIS — Z0000 Encounter for general adult medical examination without abnormal findings: Secondary | ICD-10-CM | POA: Insufficient documentation

## 2024-05-26 DIAGNOSIS — F329 Major depressive disorder, single episode, unspecified: Secondary | ICD-10-CM

## 2024-05-26 DIAGNOSIS — M810 Age-related osteoporosis without current pathological fracture: Secondary | ICD-10-CM

## 2024-05-26 DIAGNOSIS — F4321 Adjustment disorder with depressed mood: Secondary | ICD-10-CM

## 2024-05-26 DIAGNOSIS — R569 Unspecified convulsions: Secondary | ICD-10-CM

## 2024-05-26 DIAGNOSIS — E785 Hyperlipidemia, unspecified: Secondary | ICD-10-CM

## 2024-05-26 DIAGNOSIS — I10 Essential (primary) hypertension: Secondary | ICD-10-CM

## 2024-05-26 DIAGNOSIS — E038 Other specified hypothyroidism: Secondary | ICD-10-CM

## 2024-05-26 NOTE — Progress Notes (Signed)
 [facility]  Medicare Wellness Visit               [name] is a 89 y.o. female who presents today for the following Medicare Wellness Visit: Annual Wellness Visit - Subsequent     History of Present Illness    Health Risk Assessment   During the past month, how would you rate your general health?:  (Patient-Rptd) (P) Good  Which of the following tasks can you do without assistance - drive or take the bus alone; shop for groceries or clothes; prepare your own meals; do your own housework/laundry; handle your own finances/pay bills; eat, bathe or get around your home?: (Patient-Rptd) (P) Eat, bathe, dress or get around your home  Which of the following problems have you been bothered by in the past month - dizzy when standing up; problems using the phone; feeling tired or fatigued; moderate or severe body pain?:    Do you exercise for about 20 minutes 3 or more days per week?:(Patient-Rptd) (P) Yes  During the past month was someone available to help if you needed and wanted help?  For example, if you felt nervous, lonely, got sick and had to stay in bed, needed someone to talk to, needed help with daily chores or needed help just taking care of yourself.: (Patient-Rptd) (P) Yes  Do you always wear a seat belt?: (Patient-Rptd) (P) Yes  Do you have any trouble taking medications the way you have been told to take them?: (Patient-Rptd) (P) No  Have you been given any information that can help you with keeping track of your medications?: (Patient-Rptd) (P) Yes  Do you have trouble paying for your medications?: (Patient-Rptd) (P) No  Hospitalizations   Hospitalization within past year: No    Screenings         05/22/2024 05/26/2024   Ambulatory Screenings   Falls Risk: [name] more than 2 times in past year Y     Falls Risk: Suffer any injuries? N     Depression: PHQ2 Total Score  0   Depression: PHQ9 Total Score  0       Patient-reported        Substance Use Disorder  Screen:  [name]  reports that she has never smoked. She has never used smokeless tobacco. She reports that she does not currently use alcohol. She reports that she does not use drugs.            Functional Ability/Level of Safety   Falls Risk/Home Safety Assessment:  Have you been given any information that can help you with hazards in your house, such as scatter rugs, furniture, etc?: (Patient-Rptd) (P) Yes  Do you feel unsteady when standing or walking?: (Patient-Rptd) (P) Yes  Do you worry about falling?: (Patient-Rptd) (P) Yes  Have you fallen two or more times in the past year?: (Patient-Rptd) (P) Yes  Did you suffer any injuries from your falls in the past year?: (Patient-Rptd) (P) No    Home Safety:   Proper lighting stairs/bathrooms/bedrooms      Hearing Assessment:  patient reports hearing slightly decreased     Visual Acuity:     If no visual acuity exam above, the patient has declined the visual acuity exam portion of this encounter.      Exercise:   exercises >4x/week    Diet:  Diet: - consumes a well balanced diet  - compliant with heart healthy and well balanced diet  Activities of Daily Living   ADL's  Bathing: Independent  Dressing: Independent  Mobility: Independent  Transfer: Independent  Eating: Independent  Toileting: Independent    IADL's  Phone: Independent  Housekeeping: Requires maximum assistance  Laundry: Requires maximum assistance  Transportation: Requires maximum assistance  Medications: Requires maximum assistance  Finances: Requires maximum assistance    ADL assistance:   No assistance needed and Daughter    Social Activities/Engagement   Frequency of Communication with Friends and Family:  very often    Frequency of Social Gatherings with Friends and Family:   very often    Advanced Care Planning   Discussion of Advance Directives:   Has an Scientist, [employer]. A copy has been provided and is in chart.         Exam   There were no vitals taken for this visit.  Physical  Exam      Evaluation of Cognitive Function   Mood/affect: Appropriate  Appearance:  alert, well appearing, and in no distress  Family member/caregiver input: Present- no concerns.    Mini-Cog Score:  3 recalled words - negative screen for dementia      Assessment/Plan     Assessment & Plan  Encounter for subsequent annual wellness visit (AWV) in Medicare patient  A/P:  AWV completed today.           Assessment & Plan        Personalized Prevention Plan   The patient was provided with a personalized prevention plan via   the Patient Instructions tab of this visit     History/Care Team   Patient Care Team:  Jeppie Slater NOVAK, NP as PCP - General (Nurse Practitioner)  Latrelle Eveline PARAS, MD as Consulting Physician (Cardiology)  Wilkie Mt as Consulting Physician (Cardiology)  May, Robert as Consulting Physician (Urology)  Dia Hacker as Consulting Physician (Neurology)  Jerilynn Crock as Consulting Physician (Podiatry)  Susanne Merck as Primary Care Provider  Fredrica Hopkins, Thersia  Toward, Inocente HUGHS as Licensed Clinical Social Worker (Licensed Clinical Social Worker)  [place], [place], CHARITY FUNDRAISER (Inactive) as Registered Nurse  Verline Jodee SAILOR, MD as Consulting Physician (Cardiology)  Medical, surgical, family history reviewed and updated during this encounter  Medication list updated and reconciled during this encounter    Additional Documentation         Verbal consent obtained to record this visit when ambient technology is utilized.

## 2024-05-26 NOTE — Assessment & Plan Note (Signed)
 A/P:  Stable on mirtazapine .  Doing much better now that she is so involved with family and community activities.

## 2024-05-26 NOTE — Assessment & Plan Note (Signed)
 A/P:  TSH, reflex to T4

## 2024-05-26 NOTE — Assessment & Plan Note (Signed)
 A/P:  Declined DEXA

## 2024-05-26 NOTE — Assessment & Plan Note (Signed)
A/P: No change mirtazapine.

## 2024-05-26 NOTE — Assessment & Plan Note (Signed)
 A/P:  Lipids well controlled.  No change rosuvastatin .  Recheck lipids May 2026.

## 2024-05-26 NOTE — Assessment & Plan Note (Addendum)
 A/P:  BP controlled. No change amlodipine  or lisinopril .  Continue to watch sodium intake.  Check BMP, CBC.

## 2024-05-26 NOTE — Assessment & Plan Note (Addendum)
 A/P:  AWV completed today.

## 2024-05-26 NOTE — Patient Instructions (Signed)
 Women's Preventive Wellness Plan  Today's Date: May 26, 2024    Patient Name:[name]    Date of Birth: [birth year]     As part of your wellness benefit, Medicare makes many screening tests available to you at no charge.  A complete list of these tests can be found at their website, [email or web address]. However, many of these tests or recommendations are out of date, or may not apply to you. After careful consideration of your own personal health needs, the following testing is recommended for you:      Preventive Service    Up-to-date (UTD)/Due/Not Applicable (N/A)   Last Done   Medicare Frequency   Body Mass Index   Up-to-date May 26, 2024  (BMI):There is no height or weight on file to calculate BMI.   Height:   Weight:  Annually   Blood Pressure: Up-to-date May 26, 2024        Every 2 yrs, if BP </= 120/80 mm hg  Annually, if BP >120-139/80-89 mm hg   Cholesterol Testing Up-to-date Lab Results   Component Value Date    LDL 78 12/15/2023     Regularly beginning at age 43 with risk factors   Diabetes Screening Not applicable Lab Results   Component Value Date    GLU 89 12/15/2023      If prediabetes, one screening every 6 months  Otherwise, one screening every 12 months with certain risk factors for diabetes   Osteoporosis Screening   (Bone Density Measurement)  Declined  Routinely, for women aged 65+  Routinely, for women aged 60-64 with risk factors   Colorectal Cancer Screening Not medically indicated  Annually, Fecal Occult Blood Stool (FOBS)  Every 5 yrs, Sigmoidoscopy with FOBS  Every 10 yrs, Colonoscopy  Every 3 yrs, Cologuard   Depression Screening Up-to-date May 26, 2024  As necessary for those with risk factors   Sexually Transmitted Diseases (STDs) & HIV Screening Not medically indicated  As necessary for those with risk factors   Alcohol Misuse Screening Not medically indicated  As necessary for those with risk  factors   Immunizations:   Nursing to check registry Immunization History   Administered Date(s) Administered    COVID-19 (SARS-COV-2) vaccine 05/01/2024    COVID-19 mRNA BIVALENT vaccine 12 years and above [employer]) 30 mcg/0.3 mL 03/17/2023    COVID-19 mRNA MONOVALENT vaccine PRIMARY SERIES 12 years and above [employer]) 30 mcg/0.3 mL (DILUTE BEFORE USE) 08/17/2019, 09/12/2019, 05/11/2022    COVID-19 mRNA vaccine 12 years and above (PFIZER/COMIRNATY) 30 mcg/0.3 mL 03/17/2023    Influenza trivalent 2025-2026 inactivated high-dose vaccine 65 yrs+ (FLUZONE  HIGH DOSE) 0.5 mL 05/21/2023    RSV vaccine (AREXVY), recombinant, RSVpreF3, adjuvant reconstituted, PF, 0.5 mL 08/07/2022    Prevnar 13: 1 dose after age 75  Pneumovax 20: 1 dose 1 year after Prevnar  Influenza: Annually   Advance Directive Up-to-date  Once; update as needed   Medical Nutrition Therapy Not medically indicated  As necessary for diabetes or renal disease   Smoking Cessation Counseling Not medically indicated Counseling given: Not Answered   Frequency: two cessation attempts per year.   Glaucoma Screening Up-to-date  Annually for covered high risk Medicare beneficiaries (one of the following: DM, FHx Glaucoma, African-Americans aged 55+, Hispanic-Americans aged 65+)   Lung Cancer Screening Not medically indicated  Annually if asymptomatic, tobacco smoking history of at least 30 pack-years (one pack-year = smoking one pack per day for one year; 1 pack = 20 cigarettes), and  current smoker or one who has quit smoking within the last 15 years     Your major risk factors:       Falls Risk     Recommendations for improvement:    Weight bearing exercises     Referrals:    See After Visit Summary orders

## 2024-05-26 NOTE — Assessment & Plan Note (Signed)
 A/P:  Doing well. No change Briviact  or magnesium .  Followed closely by neuro.

## 2024-05-26 NOTE — Progress Notes (Signed)
 Date: 05/26/2024    Patient Name: [name]    Patient was seen in their home (POS 12) in lieu of an office visit for the following reason:   Requires use of an assistive device in order to ambulate due to ambulatory dysfunction.     Code Status: DNAR    HPI:   [name] is a 89 y.o. F that I am here to see for a primary care follow-up visit.  Dtr, [name], was present and supplemented her mother's information.    Since last seen in June, has been doing well.  Spent a month in [place]  visiting family and friends.  Now staying active and participating in chair yoga and low impact aerobics.  Joined dtr's book club and will start painting again soon.  Denied feeling depressed or anxious.  Sleeping well and appetite good.  Did fall when [facility] wheel fell off, did not hit head, no injuries.    No seizures or episodes of eye twitching. Saw Dr. Bohdan (neuro) in September.  No changes in meds.      Saw Dr. Verline (cardio) in July.  No change in meds.  Problem   Encounter for Subsequent Annual Wellness Visit (Awv) in Medicare Patient   Reactive Depression   Seizures (Cms/Hcc)   Situational Depression    Mood stable on mirtazapine .       Hypertension    BP 138/78, P 71 at time of visit. No CP, SOB, edema.       Hyperlipidemia    On rosuvastatin  20 mg daily  12/15/23  Chol 154  Trigs 125  HDL 51  LDL 78     Hypothyroidism    02/01/23: TSH 0.38, levothyroxine  was reduced from 88 mcg to 50 mcg daily.       05/31/23:  TSH 0.93, levothyroxine  50 mcg daily    12/15/23:  TSH 5.86, levothyroxine  50 mcg daily     Osteoporosis    Dexa bone density axial skeleton 05/01/21 indicates osteopenia left femoral neck and total left hip.  Normal bone mineral density of lumbar spine.  Taking Centrum Silver  for 50+ Women but calcium  and Vit D3 do not meet recommended daily requirement.     Neck Pain, Bilateral Posterior (Resolved)    Continues to c/o stiffness and achiness on either side of her neck.  Has been working  with PT who recommended TENS unit, twice a day for 15.  Has not needed Aspercreme or acetaminophen .  Does feel it is getting a little better.  Denied numbness, tingling or radiation.          Review of Systems   Constitutional:  Negative for activity change, appetite change and fatigue.   Respiratory:  Negative for shortness of breath.    Cardiovascular:  Negative for chest pain, palpitations and leg swelling.   Gastrointestinal:  Negative for constipation, diarrhea, nausea and vomiting.   Musculoskeletal:  Positive for gait problem. Negative for arthralgias and myalgias.   Neurological:  Negative for dizziness, seizures and weakness.   Psychiatric/Behavioral:  Negative for dysphoric mood and sleep disturbance. The patient is not nervous/anxious.      Medical History[1]  Past Surgical History[2]  Family History[3]  Social History[4]  Allergies[5]    MEDICATIONS:   Current Medications[6]    PHYSICAL EXAM:   BP 138/78   Pulse 71   Temp 98.2 F (36.8 C)   Ht 1.575 m (5' 2)   Wt 70.8 kg (156 lb)   SpO2 98%  BMI 28.53 kg/m    Wt Readings from Last 1 Encounters:   05/26/24 70.8 kg (156 lb)      Ht Readings from Last 1 Encounters:   05/26/24 1.575 m (5' 2)     Physical Exam  Constitutional:       General: She is not in acute distress.     Appearance: Normal appearance. She is normal weight. She is not ill-appearing or toxic-appearing.   Cardiovascular:      Rate and Rhythm: Regular rhythm.      Pulses: Normal pulses.      Heart sounds: Murmur: 2/6 pan SEM LSB, split s2.   Pulmonary:      Effort: Pulmonary effort is normal.      Breath sounds: Normal breath sounds.   Abdominal:      General: Abdomen is flat. There is no distension.      Palpations: Abdomen is soft.      Tenderness: There is no abdominal tenderness.   Musculoskeletal:         General: Normal range of motion.      Right lower leg: No edema.      Left lower leg: No edema.   Skin:     General: Skin is warm and dry.   Neurological:      Mental  Status: She is alert and oriented to person, place, and time.   Psychiatric:         Mood and Affect: Mood and affect normal.       DIAGNOSTICS:     Lab Results   Component Value Date    WBC 4.50 12/15/2023    HGB 13.0 12/15/2023    HCT 39.5 12/15/2023    PLT 207 12/15/2023    CHOL 154 12/15/2023    TRIG 125 12/15/2023    HDL 51 12/15/2023    LDL 78 12/15/2023    ALT 42 12/15/2023    AST 43 (H) 12/15/2023    NA 135 12/15/2023    K 4.5 12/15/2023    CL 102 12/15/2023    CREAT 0.9 12/15/2023    BUN 28 (H) 12/15/2023    CO2 23 12/15/2023    TSH 5.86 (H) 12/15/2023    INR 1.0 03/10/2021    GLU 89 12/15/2023    HGBA1C 5.6 03/10/2021    ALKPHOS 59 12/15/2023     No results found.    ASSESSMENT and PLAN:   Hypertension  A/P:  BP controlled. No change amlodipine  or lisinopril .  Continue to watch sodium intake.  Check BMP, CBC.    Hyperlipidemia  A/P:  Lipids well controlled.  No change rosuvastatin .  Recheck lipids May 2026.    Seizures (CMS/HCC)  A/P:  Doing well. No change Briviact  or magnesium .  Followed closely by neuro.    Situational depression  A/P:  No change mirtazapine .    Hypothyroidism  A/P:  TSH, reflex to T4    Osteoporosis  A/P:  Declined DEXA    Reactive depression  A/P:  Stable on mirtazapine .  Doing much better now that she is so involved with family and community activities.      Orders Placed This Encounter   Procedures    CBC without Differential     Standing Status:   Future     Expected Date:   06/02/2024     Expiration Date:   05/26/2025     Release to patient:   Immediate    Basic Metabolic Panel  Standing Status:   Future     Expected Date:   06/02/2024     Expiration Date:   05/26/2025     Has the patient fasted?:   No     Release to patient:   Immediate    TSH with Reflex to Free T4     Standing Status:   Future     Expected Date:   06/02/2024     Expiration Date:   05/26/2025     Release to patient:   Immediate     Myranda was seen today for follow-up, hypertension, seizures, fall and  depression.    Diagnoses and all orders for this visit:    Primary hypertension  -     CBC without Differential; Future  -     Basic Metabolic Panel; Future    Other specified hypothyroidism  -     Cancel: TSH; Future  -     TSH with Reflex to Free T4; Future    Hyperlipidemia, unspecified hyperlipidemia type    Seizures (CMS/HCC)    Situational depression    Age-related osteoporosis without current pathological fracture    Reactive depression        Electronically Signed by Slater KATHEE Sequin, NP         [1]   Past Medical History:  Diagnosis Date    Balance problem     Bladder problem     Bleeding in head following injury with loss of consciousness (CMS/HCC)     a year ago    Complication of anesthesia     Convulsions (CMS/HCC)     Headache     Associated with brain bleeds    Hearing loss     Hypercholesterolemia     Take meds for cholesterol    Hyperlipidemia     Hypertension     Hypothyroidism     Irregular heartbeat     Occupational therapy and vocational rehabilitation     Other physical therapy     Post-operative nausea and vomiting     Syncope and collapse     It's happened twice   [2]   Past Surgical History:  Procedure Laterality Date    AORTIC VALVE REPLACEMENT      first AVR required sternotomy, >10 years ago per patient    BACK SURGERY      CARDIAC VALVE REPLACEMENT      HYSTERECTOMY      JOINT REPLACEMENT      REPLACEMENT TOTAL KNEE Bilateral     left total, right half knee replacement    TRANSCATHETER AORTIC VALVE REPLACEMENT  2016    5-6 years ago per patient    WRIST SURGERY Left 2017   [3]   Family History  Problem Relation Name Age of Onset    Stroke Mother Claude Lan     Anuerysm Mother Claude Lan     Diabetes Mother Claude Lan     Cancer Father Madonna Lan     Heart disease Sister      Heart disease Brother      Arthritis Sister all my sisters (3)     Heart disease Brother Lamar Lan     Intracerebral hemorrhage Neg Hx     [4]   Social History  Tobacco Use     Smoking status: Never    Smokeless tobacco: Never   Vaping Use    Vaping status: Never Used   Substance Use Topics    Alcohol use: Not  Currently     Comment: 1-2 a week    Drug use: Never   [5]   Allergies  Allergen Reactions    Codeine Nausea And Vomiting   [6]   Current Outpatient Medications:     acetaminophen  (TYLENOL ) 650 MG CR tablet, Take 1 tablet (650 mg) by mouth every 8 (eight) hours as needed for Pain, Disp: 30 tablet, Rfl: 0    ALPRAZolam  (Xanax ) 0.25 MG tablet, Take 1 tablet (0.25 mg) by mouth 2 (two) times daily as needed for Anxiety, Disp: 30 tablet, Rfl: 0    amLODIPine  (NORVASC ) 5 MG tablet, Take 1 tablet (5 mg) by mouth once at bedtime Hold if SBP <100.  May take two 5 mg (10 mg) if SBP > 160., Disp: 90 tablet, Rfl: 3    brivaracetam  (Briviact ) 100 MG Tablet tablet, Take 1 tablet (100 mg) by mouth once every evening, Disp: 90 tablet, Rfl: 1    Brivaracetam  (BRIVIACT ) 50 MG Tablet tablet, Take 1 tablet (50 mg) by mouth once every morning, Disp: 90 tablet, Rfl: 1    levothyroxine  (SYNTHROID ) 50 MCG tablet, Take 1 tablet (50 mcg) by mouth once daily, Disp: 90 tablet, Rfl: 3    lidocaine  (LMX) 4 % cream, Apply topically 4 (four) times daily as needed (pain) (Patient not taking: Reported on 03/29/2024), Disp: 28 g, Rfl: 3    lisinopril  (ZESTRIL ) 10 MG tablet, TAKE 1 TABLET ORALLY ONCE A DAY EVERY MORNING FOR 30 DAY(S), Disp: 90 tablet, Rfl: 2    Magnesium  400 MG Tablet, Take 1 tablet (400 mg) by mouth 2 (two) times daily, Disp: 180 tablet, Rfl: 1    mirtazapine  (REMERON ) 30 MG tablet, Take 1 tablet (30 mg) by mouth once at bedtime, Disp: 90 tablet, Rfl: 3    Multiple Vitamins-Minerals (Centrum Silver  50+Women) Tab, Take 1 tablet by mouth daily, Disp: 100 tablet, Rfl: 3    rosuvastatin  (CRESTOR ) 20 MG tablet, Take 1 tablet (20 mg) by mouth once at bedtime, Disp: 90 tablet, Rfl: 3    vitamin B-6 (PYRIDOXINE) 50 MG tablet, Take 1 tablet (50 mg) by mouth daily, Disp: 90 tablet, Rfl: 1

## 2024-06-07 ENCOUNTER — Ambulatory Visit: Attending: Nurse Practitioner

## 2024-06-07 DIAGNOSIS — I10 Essential (primary) hypertension: Secondary | ICD-10-CM | POA: Insufficient documentation

## 2024-06-07 LAB — BASIC METABOLIC PANEL
Anion Gap: 6 (ref 5.0–15.0)
BUN: 25 mg/dL — ABNORMAL HIGH (ref 7–21)
CO2: 24 meq/L (ref 17–29)
Calcium: 8.9 mg/dL (ref 7.9–10.2)
Chloride: 103 meq/L (ref 99–111)
Creatinine: 0.9 mg/dL (ref 0.4–1.0)
GFR: 58.5 mL/min/1.73 m2 — ABNORMAL LOW (ref >=60.0)
Glucose: 100 mg/dL (ref 70–100)
Hemolysis Index: 18 {index}
Potassium: 4.4 meq/L (ref 3.5–5.3)
Sodium: 133 meq/L — ABNORMAL LOW (ref 135–145)

## 2024-06-07 LAB — CBC
Absolute nRBC: 0 x10 3/uL (ref <=0.00)
Hematocrit: 38.8 % (ref 34.7–43.7)
Hemoglobin: 12.5 g/dL (ref 11.4–14.8)
MCH: 31.3 pg (ref 25.1–33.5)
MCHC: 32.2 g/dL (ref 31.5–35.8)
MCV: 97 fL — ABNORMAL HIGH (ref 78.0–96.0)
MPV: 10.6 fL (ref 8.9–12.5)
Platelet Count: 191 x10 3/uL (ref 142–346)
RBC: 4 x10 6/uL (ref 3.90–5.10)
RDW: 14 % (ref 11–15)
WBC: 3.4 x10 3/uL (ref 3.10–9.50)
nRBC %: 0 /100{WBCs} (ref <=0.0)

## 2024-06-08 ENCOUNTER — Other Ambulatory Visit (INDEPENDENT_AMBULATORY_CARE_PROVIDER_SITE_OTHER): Payer: Self-pay | Admitting: Nurse Practitioner

## 2024-06-08 ENCOUNTER — Encounter (INDEPENDENT_AMBULATORY_CARE_PROVIDER_SITE_OTHER): Payer: Self-pay | Admitting: Nurse Practitioner

## 2024-06-08 NOTE — Progress Notes (Signed)
 Labs reviewed.  BUN 25 and Na 133.  Dtr notified and advised to have her mother drink more fluids and liberalize Na intake.    Total time:  10

## 2024-07-03 ENCOUNTER — Other Ambulatory Visit (INDEPENDENT_AMBULATORY_CARE_PROVIDER_SITE_OTHER): Payer: Self-pay | Admitting: Nurse Practitioner

## 2024-07-03 DIAGNOSIS — F419 Anxiety disorder, unspecified: Secondary | ICD-10-CM

## 2024-07-06 ENCOUNTER — Encounter (INDEPENDENT_AMBULATORY_CARE_PROVIDER_SITE_OTHER): Payer: Self-pay | Admitting: Nurse Practitioner

## 2024-07-06 MED ORDER — ALPRAZOLAM 0.25 MG PO TABS: 0.2500 mg | ORAL_TABLET | Freq: Two times a day (BID) | ORAL | 0 refills | Status: DC | PRN

## 2024-07-26 ENCOUNTER — Encounter (INDEPENDENT_AMBULATORY_CARE_PROVIDER_SITE_OTHER): Payer: Self-pay

## 2024-07-26 ENCOUNTER — Other Ambulatory Visit (INDEPENDENT_AMBULATORY_CARE_PROVIDER_SITE_OTHER): Payer: Self-pay

## 2024-07-26 MED ORDER — MAGNESIUM 400 MG PO TABS: 1.0000 | ORAL_TABLET | Freq: Two times a day (BID) | ORAL | 1 refills | Status: AC

## 2024-07-27 NOTE — Progress Notes (Unsigned)
"  Please see my chart messages.   "

## 2024-07-28 ENCOUNTER — Other Ambulatory Visit (INDEPENDENT_AMBULATORY_CARE_PROVIDER_SITE_OTHER): Payer: Self-pay

## 2024-07-28 ENCOUNTER — Ambulatory Visit

## 2024-07-28 VITALS — BP 118/70 | HR 82 | Temp 97.0°F

## 2024-07-28 DIAGNOSIS — E039 Hypothyroidism, unspecified: Secondary | ICD-10-CM

## 2024-07-28 DIAGNOSIS — E785 Hyperlipidemia, unspecified: Secondary | ICD-10-CM

## 2024-07-28 DIAGNOSIS — I10 Essential (primary) hypertension: Secondary | ICD-10-CM

## 2024-07-28 DIAGNOSIS — F419 Anxiety disorder, unspecified: Secondary | ICD-10-CM

## 2024-07-28 DIAGNOSIS — B351 Tinea unguium: Secondary | ICD-10-CM

## 2024-07-28 DIAGNOSIS — G40109 Localization-related (focal) (partial) symptomatic epilepsy and epileptic syndromes with simple partial seizures, not intractable, without status epilepticus: Secondary | ICD-10-CM

## 2024-07-28 MED ORDER — ALPRAZOLAM 0.25 MG PO TABS: 0.2500 mg | ORAL_TABLET | Freq: Two times a day (BID) | ORAL | 0 refills | Status: AC | PRN

## 2024-07-28 NOTE — Assessment & Plan Note (Addendum)
 On Brivaracetam  50 mg in the morning, 100 mg in the evening   Magnesium  400 mg BID

## 2024-07-28 NOTE — Assessment & Plan Note (Signed)
 Chronic toenail fungal infection with ineffective past treatments. Discussed podiatrist referral.  - Apply tea tree oil for fungal infection.  - Referred to specialist for foot care management.

## 2024-07-28 NOTE — Assessment & Plan Note (Signed)
 On Rosuvastatin  20 mg QD

## 2024-07-28 NOTE — Progress Notes (Signed)
 [facility]        Date: 07/28/2024    Patient Name: [name],[name]  Code Status: DNAR    Patient was seen in their home (POS 12) in lieu of an office visit for the following reason:   Requires use of an assistive device in order to ambulate due to ambulatory dysfunction.    HPI:   [name] is a 89 y.o. year old who I am seeing for primary care follow up.     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 Patient can ambulate with an assistive device. She is accompanied by her daughter, who is her primary caregiver.    History of Present Illness  She has right toe pain with redness that started after a walk on Wednesday and looks infected per the daughter. Epsom salt baths give partial relief. She has used topical creams for recurrent fungal infections but the problem persists. Her daughter notes she often over-files her toenails, which may worsen symptoms.    She has epilepsy treated with Briviact . Her daughter has noticed recent eye twitching, which is improved today. She takes magnesium  oxide for this and has not had recent seizures.    She has recurrent UTIs, with the last episode about 18 months ago. When she has a UTI, her daughter typically notices a strong urine odor and that the patient seems cognitively disconnected. She currently denies urinary symptoms.    Her daughter organizes and administers her medications, including intermittent Alprazolam  during stressful periods such as holidays, and also helps with medication management when she travels to [place] .    She stays active with walking, yoga, and art. She uses a wheelchair for stability during walks and a walker at night to prevent falls.        [email or web address]]  Neurology: Genelle Ganser, MD  Cardiology: Verline Jodee SAILOR, MD    Problem List[1]   Past  Surgical History[2]  Family History[3]  Social History[4]  Allergies[5]    MEDICATIONS:   Current Medications[6]    PHYSICAL EXAM:   BP 118/70 (BP Site: Left arm, Patient Position: Sitting)   Pulse 82   Temp 97 F (36.1 C)   SpO2 98%    Wt Readings from Last 3 Encounters:   05/26/24 70.8 kg (156 lb)   03/29/24 71.7 kg (158 lb)   02/11/24 70.8 kg (156 lb)      Ht Readings from Last 3 Encounters:   05/26/24 1.575 m (5' 2)   03/29/24 1.575 m (5' 2)   02/11/24 1.575 m (5' 2)     General: older, frail appearing, alert, no acute distress  HEENT: eomi, sclera anicteric  CV: s1, s2, regular rate and rhythm  Lungs: breathing comfortably, no accessory muscle use  Abd: soft, NT, ND, +bs  Ext: wwp, no edema  Skin: no rashes or lesions noted    DIAGNOSTICS:     Lab Results   Component Value Date    WBC 3.40 06/07/2024    HGB 12.5 06/07/2024    HCT 38.8 06/07/2024    PLT 191 06/07/2024    CHOL 154 12/15/2023    TRIG 125 12/15/2023    HDL 51 12/15/2023    LDL 78 12/15/2023    ALT 42 12/15/2023    AST 43 (H) 12/15/2023    NA 133 (  L) 06/07/2024    K 4.4 06/07/2024    CL 103 06/07/2024    CREAT 0.9 06/07/2024    BUN 25 (H) 06/07/2024    CO2 24 06/07/2024    TSH 5.86 (H) 12/15/2023    INR 1.0 03/10/2021    GLU 100 06/07/2024    HGBA1C 5.6 03/10/2021    ALKPHOS 59 12/15/2023     No results found.    ASSESSMENT and PLAN:   Hypothyroidism  On Levothyroxine  50 mcg QD    Hypertension  On Amlodipine  5 mg QD, lisinopril  10 mg QD    Hyperlipidemia  On Rosuvastatin  20 mg QD    Temporal lobe epilepsy (CMS/HCC)  On Brivaracetam  50 mg in the morning, 100 mg in the evening   Magnesium  400 mg BID    Onychomycosis  Chronic toenail fungal infection with ineffective past treatments. Discussed podiatrist referral.  - Apply tea tree oil for fungal infection.  - Referred to specialist for foot care management.      [name] was seen today for follow-up.    Diagnoses and all orders for this visit:    Hypothyroidism, unspecified type    Primary  hypertension    Hyperlipidemia, unspecified hyperlipidemia type    Temporal lobe epilepsy (CMS/HCC)    Onychomycosis        Electronically Signed by Clover Crawley, FNP  Geriatric Medicine   [facility] Calls            [1]   Patient Active Problem List  Diagnosis    Nontraumatic acute subdural hemorrhage (CMS/HCC)    Cerebral edema (CMS/HCC)    Left temporal and right parietal hemorrhagic contusions on MRI 03/04/21    Subdural hematoma (CMS/HCC)    Hyponatremia    S/p TAVR (transcatheter aortic valve replacement), bioprosthetic    Temporal lobe epilepsy (CMS/HCC)    Hypothyroidism    Osteoporosis    Gait abnormality    Palpitations    Constipation    Hypertension    Hyperlipidemia    Situational depression    Loss of appetite    Fatigue    Seizures (CMS/HCC)    Onychomycosis    Fall    Diarrhea    Chronic low back pain    Anxiety    Pedal edema    Reactive depression    Encounter for subsequent annual wellness visit (AWV) in Medicare patient   [2]   Past Surgical History:  Procedure Laterality Date    AORTIC VALVE REPLACEMENT      first AVR required sternotomy, >10 years ago per patient    BACK SURGERY      CARDIAC VALVE REPLACEMENT      HYSTERECTOMY      JOINT REPLACEMENT      REPLACEMENT TOTAL KNEE Bilateral     left total, right half knee replacement    TRANSCATHETER AORTIC VALVE REPLACEMENT  2016    5-6 years ago per patient    WRIST SURGERY Left 2017   [3]   Family History  Problem Relation Name Age of Onset    Stroke Mother Claude Lan     Anuerysm Mother Claude Lan     Diabetes Mother Claude Lan     Cancer Father Madonna Lan     Heart disease Sister      Heart disease Brother      Arthritis Sister all my sisters (3)     Heart disease Brother Lamar Lan     Intracerebral hemorrhage Neg  Hx     [4]   Social History  Tobacco Use    Smoking status: Never    Smokeless tobacco: Never   Vaping Use    Vaping status: Never Used   Substance Use Topics    Alcohol use: Not Currently      Comment: 1-2 a week    Drug use: Never   [5]   Allergies  Allergen Reactions    Codeine Nausea And Vomiting   [6]   Current Outpatient Medications:     ALPRAZolam  (Xanax ) 0.25 MG tablet, Take 1 tablet (0.25 mg) by mouth 2 (two) times daily as needed for Anxiety, Disp: 30 tablet, Rfl: 0    amLODIPine  (NORVASC ) 5 MG tablet, Take 1 tablet (5 mg) by mouth once at bedtime Hold if SBP <100.  May take two 5 mg (10 mg) if SBP > 160., Disp: 90 tablet, Rfl: 3    brivaracetam  (Briviact ) 100 MG Tablet tablet, Take 1 tablet (100 mg) by mouth once every evening, Disp: 90 tablet, Rfl: 1    Brivaracetam  (BRIVIACT ) 50 MG Tablet tablet, Take 1 tablet (50 mg) by mouth once every morning, Disp: 90 tablet, Rfl: 1    levothyroxine  (SYNTHROID ) 50 MCG tablet, Take 1 tablet (50 mcg) by mouth once daily, Disp: 90 tablet, Rfl: 3    lisinopril  (ZESTRIL ) 10 MG tablet, TAKE 1 TABLET ORALLY ONCE A DAY EVERY MORNING FOR 30 DAY(S), Disp: 90 tablet, Rfl: 2    Magnesium  400 MG Tablet, Take 1 tablet (400 mg) by mouth 2 (two) times daily, Disp: 180 tablet, Rfl: 1    mirtazapine  (REMERON ) 30 MG tablet, Take 1 tablet (30 mg) by mouth once at bedtime, Disp: 90 tablet, Rfl: 3    Multiple Vitamins-Minerals (Centrum Silver  50+Women) Tab, Take 1 tablet by mouth daily, Disp: 100 tablet, Rfl: 3    rosuvastatin  (CRESTOR ) 20 MG tablet, Take 1 tablet (20 mg) by mouth once at bedtime, Disp: 90 tablet, Rfl: 3    vitamin B-6 (PYRIDOXINE) 50 MG tablet, Take 1 tablet (50 mg) by mouth daily, Disp: 90 tablet, Rfl: 1    acetaminophen  (TYLENOL ) 650 MG CR tablet, Take 1 tablet (650 mg) by mouth every 8 (eight) hours as needed for Pain, Disp: 30 tablet, Rfl: 0    lidocaine  (LMX) 4 % cream, Apply topically 4 (four) times daily as needed (pain) (Patient not taking: Reported on 03/29/2024), Disp: 28 g, Rfl: 3

## 2024-07-28 NOTE — Assessment & Plan Note (Signed)
 On Amlodipine  5 mg QD, lisinopril  10 mg QD

## 2024-07-28 NOTE — Assessment & Plan Note (Signed)
On Levothyroxine 50 mcg QD

## 2024-07-31 ENCOUNTER — Encounter (INDEPENDENT_AMBULATORY_CARE_PROVIDER_SITE_OTHER): Payer: Self-pay

## 2024-07-31 ENCOUNTER — Telehealth (INDEPENDENT_AMBULATORY_CARE_PROVIDER_SITE_OTHER): Payer: Self-pay

## 2024-07-31 NOTE — Telephone Encounter (Addendum)
-----   Message from Clover Crawley, [place] sent at 07/28/2024  3:48 PM EST -----  Daughter said she was not able to coneect with [facility]. Could you send a referral to Odetta Clause or another nurse for foot care? I sent a referral Also, inform her daughter of the contact information. thanks           LPN faxed Podiatry referral to NP Odetta Clause fax# [phone] ph# [phone] .    LPN sent a mychart message to pt.  with NP Adams information.

## 2024-08-02 ENCOUNTER — Encounter (INDEPENDENT_AMBULATORY_CARE_PROVIDER_SITE_OTHER): Payer: Self-pay

## 2024-08-16 ENCOUNTER — Other Ambulatory Visit (INDEPENDENT_AMBULATORY_CARE_PROVIDER_SITE_OTHER): Payer: Self-pay

## 2024-08-16 DIAGNOSIS — I10 Essential (primary) hypertension: Secondary | ICD-10-CM

## 2024-08-16 MED ORDER — LISINOPRIL 10 MG PO TABS: ORAL_TABLET | ORAL | 2 refills | Status: AC

## 2024-09-22 ENCOUNTER — Encounter (INDEPENDENT_AMBULATORY_CARE_PROVIDER_SITE_OTHER)

## 2025-02-14 ENCOUNTER — Ambulatory Visit (INDEPENDENT_AMBULATORY_CARE_PROVIDER_SITE_OTHER): Admitting: Internal Medicine

## 2025-04-04 ENCOUNTER — Ambulatory Visit: Admitting: Neurology
